# Patient Record
Sex: Male | Born: 1975 | Race: White | Hispanic: No | Marital: Married | Smoking: Former smoker
Health system: Southern US, Community
[De-identification: ages and names within clinical notes are randomized; demographics above are authoritative.]

## PROBLEM LIST (undated history)

## (undated) DIAGNOSIS — F32A Depression, unspecified: Secondary | ICD-10-CM

## (undated) DIAGNOSIS — F1020 Alcohol dependence, uncomplicated: Secondary | ICD-10-CM

## (undated) DIAGNOSIS — G4730 Sleep apnea, unspecified: Secondary | ICD-10-CM

## (undated) DIAGNOSIS — F1910 Other psychoactive substance abuse, uncomplicated: Secondary | ICD-10-CM

## (undated) HISTORY — DX: Other psychoactive substance abuse, uncomplicated: F19.10

## (undated) HISTORY — PX: ORIF ULNAR / RADIAL SHAFT FRACTURE: SUR966

## (undated) HISTORY — DX: Alcohol dependence, uncomplicated: F10.20

## (undated) HISTORY — PX: NASAL SEPTUM SURGERY: SHX37

## (undated) HISTORY — PX: OTHER SURGICAL HISTORY: SHX169

## (undated) HISTORY — PX: HEEL SPUR SURGERY: SHX665

---

## 2002-03-05 ENCOUNTER — Ambulatory Visit (HOSPITAL_BASED_OUTPATIENT_CLINIC_OR_DEPARTMENT_OTHER): Admission: RE | Admit: 2002-03-05 | Discharge: 2002-03-05 | Payer: Self-pay

## 2002-04-26 ENCOUNTER — Ambulatory Visit (HOSPITAL_BASED_OUTPATIENT_CLINIC_OR_DEPARTMENT_OTHER): Admission: RE | Admit: 2002-04-26 | Discharge: 2002-04-26 | Payer: Self-pay | Admitting: Family Medicine

## 2004-01-29 ENCOUNTER — Ambulatory Visit (HOSPITAL_COMMUNITY): Admission: RE | Admit: 2004-01-29 | Discharge: 2004-01-30 | Payer: Self-pay | Admitting: Otolaryngology

## 2010-06-09 ENCOUNTER — Emergency Department (HOSPITAL_COMMUNITY)
Admission: EM | Admit: 2010-06-09 | Discharge: 2010-06-09 | Payer: Self-pay | Source: Home / Self Care | Admitting: Emergency Medicine

## 2010-10-15 NOTE — Op Note (Signed)
NAME:  Richard Osborn                         ACCOUNT NO.:  [id number]   MEDICAL RECORD NO.:  [id number]                   PATIENT TYPE:  OIB   LOCATION:  3302                                 FACILITY:  [facility]   PHYSICIAN:  Kristine Garbe. Ezzard Standing, M.D.         DATE OF BIRTH:  14-Jan-1976   DATE OF PROCEDURE:  01/29/2004  DATE OF DISCHARGE:                                 OPERATIVE REPORT   PREOPERATIVE DIAGNOSES:  1.  Septal deviation with nasal obstruction.  2.  Turbinate hypertrophy with nasal obstruction.  3.  Obstructive sleep apnea.   POSTOPERATIVE DIAGNOSIS:  1.  Septal deviation with nasal obstruction.  2.  Turbinate hypertrophy with nasal obstruction.  3.  Obstructive sleep apnea.   OPERATION PERFORMED:  Septoplasty with bilateral inferior turbinate  reductions.   SURGEON:  Kristine Garbe. Ezzard Standing, M.D.   ANESTHESIA:  General endotracheal.   COMPLICATIONS:  None.   INDICATIONS FOR PROCEDURE:  Richard Osborn is a 35 year old gentleman with  obstructive sleep apnea.  He has nasal obstruction where he had difficulty  breathing and has difficulty using the nasal CPAP because of his difficulty  breathing through his nose.  He is significantly overweight.  Sleep study  demonstrated a moderate obstructive sleep apnea with RDI of approximately  50.  He is taken to the operating room at this time for septoplasty and  turbinate reductions to help improve the nasal airway.   DESCRIPTION OF PROCEDURE:  After adequate endotracheal anesthesia, the  patient received 8 mg of Decadron IV preoperatively as well as 1 g Ancef IV  preoperatively.  Nose was prepped and draped.  Septum and turbinates were  injected with Xylocaine with epinephrine.  The patient had bowing of the  cartilaginous septum to the right airway and then into the left airway, the  cartilaginous septum bowed off at the crest and protruded into the left  inferior turbinate.  A hemitransfixion incision was made along the  caudal  edge of the septum on the right side.  Mucoperichondrial and mucoperiosteal  flaps were elevated posteriorly.  At the junction of the bony and  cartilaginous septum, vertical incision was made.  The bony septum actually  protruded to the right side obstructing the upper portion of the right nasal  airway.  The bony portion of the septum was then removed in addition to a  small portion of the cartilaginous septum.  The anterior 2 cm of  cartilaginous septum was preserved.  This allowed the septum to return more  to midline.  On the left side the cartilaginous septum protruded off the  maxillary crest into the left airway and after elevating the  mucoperichondrium off of the portion of cartilaginous septum protruded, this  was removed.  This improved the nasal airways.  Following this, turbinate  reductions were performed.  The lower one half of the turbinates were  amputated bilaterally and the remaining turbinate bone was outfractured and  hemostasis was obtained with suction cautery.  The hemitransfixion incision  was closed with 4-0 chromic suture.  The anterior portion of the  cartilaginous septum was basted with a 3-0 chromic suture.  Doyle splints  were secured to either side of the septum with a 3-0 nylon suture.  The nose  was then packed with Telfa soaked in bacitracin ointment.  This completed  the procedure.  Richard Osborn was awakened from anesthesia and transferred to  recovery room postoperatively doing well.   DISPOSITION:  Richard Osborn will be observed overnight in a monitored bed because  of his obstructive sleep apnea and unable to use nasal CPAP.  We will plan  on removing his packing in the morning and discharge home on Tylenol and  Vicodin as needed for pain.  Keflex 500 twice daily for one week.  Will have  him follow up in my office in one week for recheck to remove the [facility]  splints.                                               Kristine Garbe. Ezzard Standing, M.D.     CEN/MEDQ  D:  01/29/2004  T:  01/29/2004  Job:  [id number]

## 2011-07-02 ENCOUNTER — Emergency Department (INDEPENDENT_AMBULATORY_CARE_PROVIDER_SITE_OTHER): Payer: Self-pay

## 2011-07-02 ENCOUNTER — Encounter (HOSPITAL_BASED_OUTPATIENT_CLINIC_OR_DEPARTMENT_OTHER): Payer: Self-pay | Admitting: Emergency Medicine

## 2011-07-02 ENCOUNTER — Emergency Department (HOSPITAL_BASED_OUTPATIENT_CLINIC_OR_DEPARTMENT_OTHER)
Admission: EM | Admit: 2011-07-02 | Discharge: 2011-07-02 | Disposition: A | Discharge status: Home / Self Care | Payer: Self-pay | Attending: Emergency Medicine | Admitting: Emergency Medicine

## 2011-07-02 DIAGNOSIS — T148XXA Other injury of unspecified body region, initial encounter: Secondary | ICD-10-CM

## 2011-07-02 DIAGNOSIS — S0181XA Laceration without foreign body of other part of head, initial encounter: Secondary | ICD-10-CM

## 2011-07-02 DIAGNOSIS — S0100XA Unspecified open wound of scalp, initial encounter: Secondary | ICD-10-CM

## 2011-07-02 DIAGNOSIS — M5380 Other specified dorsopathies, site unspecified: Secondary | ICD-10-CM

## 2011-07-02 DIAGNOSIS — IMO0002 Reserved for concepts with insufficient information to code with codable children: Secondary | ICD-10-CM | POA: Insufficient documentation

## 2011-07-02 DIAGNOSIS — F17200 Nicotine dependence, unspecified, uncomplicated: Secondary | ICD-10-CM | POA: Insufficient documentation

## 2011-07-02 DIAGNOSIS — W19XXXA Unspecified fall, initial encounter: Secondary | ICD-10-CM | POA: Insufficient documentation

## 2011-07-02 DIAGNOSIS — F1010 Alcohol abuse, uncomplicated: Secondary | ICD-10-CM

## 2011-07-02 DIAGNOSIS — S0180XA Unspecified open wound of other part of head, initial encounter: Secondary | ICD-10-CM | POA: Insufficient documentation

## 2011-07-02 MED ORDER — TETANUS-DIPHTH-ACELL PERTUSSIS 5-2.5-18.5 LF-MCG/0.5 IM SUSP
0.5000 mL | Freq: Once | INTRAMUSCULAR | Status: AC
  Administered 2011-07-02: 0.5 mL via INTRAMUSCULAR
  Filled 2011-07-02: qty 0.5

## 2011-07-02 MED ORDER — LIDOCAINE-EPINEPHRINE 2 %-1:100000 IJ SOLN
INTRAMUSCULAR | Status: AC
  Filled 2011-07-02: qty 1

## 2011-07-02 MED ORDER — TRAMADOL HCL 50 MG PO TABS: 50.0000 mg | ORAL_TABLET | Freq: Four times a day (QID) | ORAL | Status: AC | PRN

## 2011-07-02 MED ORDER — LIDOCAINE-EPINEPHRINE 2 %-1:100000 IJ SOLN
20.0000 mL | Freq: Once | INTRAMUSCULAR | Status: AC
  Administered 2011-07-02: 20 mL via INTRADERMAL

## 2011-07-02 MED ORDER — LIDOCAINE IN D5W 4-5 MG/ML-% IV SOLN: 1.0000 mg/min | Freq: Once | INTRAVENOUS | Status: DC

## 2011-07-02 NOTE — ED Provider Notes (Signed)
History     CSN: [id number]  Arrival date & time 07/02/11  0119   First MD Initiated Contact with Patient 07/02/11 0155      Chief Complaint  Patient presents with  . Fall  . Facial Laceration  . Alcohol Intoxication    (Consider location/radiation/quality/duration/timing/severity/associated sxs/prior treatment) Patient is a 36 y.o. male presenting with fall, intoxication, and scalp laceration. The history is provided by the patient and the spouse. No language interpreter was used.  Fall The accident occurred 1 to 2 hours ago. The fall occurred while walking. He fell from a height of 1 to 2 ft (5 steps face forawrd). Impact surface: stone steps. The volume of blood lost was minimal. The point of impact was the head. The pain is at a severity of 8/10. The pain is severe. He was ambulatory at the scene. There was no entrapment after the fall. There was no drug use involved in the accident. There was alcohol use involved in the accident. Pertinent negatives include no visual change, no numbness, no abdominal pain, no bowel incontinence, no nausea, no vomiting, no headaches, no hearing loss, no loss of consciousness and no tingling. Exacerbated by: nothing. Prehospitalization: none. He has tried nothing for the symptoms. The treatment provided no relief.  Alcohol Intoxication This is a new problem. The current episode started 3 to 5 hours ago. The problem occurs constantly. The problem has not changed since onset.Pertinent negatives include no abdominal pain and no headaches. The symptoms are aggravated by nothing. The symptoms are relieved by nothing. He has tried nothing for the symptoms. The treatment provided no relief.  Head Laceration This is a new problem. The current episode started 1 to 2 hours ago. The problem occurs constantly. The problem has not changed since onset.Pertinent negatives include no abdominal pain and no headaches. The symptoms are aggravated by nothing. The symptoms are  relieved by nothing. He has tried nothing for the symptoms. The treatment provided no relief.    History reviewed. No pertinent past medical history.  Past Surgical History  Procedure Date  . Nasal septum surgery     No family history on file.  History  Substance Use Topics  . Smoking status: Current Everyday Smoker  . Smokeless tobacco: Not on file  . Alcohol Use: Yes      Review of Systems  Constitutional: Negative.   HENT: Negative for rhinorrhea and neck pain.   Eyes: Negative for visual disturbance.  Respiratory: Negative.   Cardiovascular: Negative.   Gastrointestinal: Negative.  Negative for nausea, vomiting, abdominal pain and bowel incontinence.  Genitourinary: Negative.   Musculoskeletal: Negative.   Skin: Positive for wound.  Neurological: Negative for tingling, loss of consciousness, numbness and headaches.  Hematological: Negative.   Psychiatric/Behavioral: Negative.     Allergies  Review of patient's allergies indicates no known allergies.  Home Medications   Current Outpatient Rx  Name Route Sig Dispense Refill  . TRAMADOL HCL 50 MG PO TABS Oral Take 1 tablet (50 mg total) by mouth every 6 (six) hours as needed for pain. 15 tablet 0    BP 133/49  Pulse 92  Temp(Src) 97.8 F (36.6 C) (Oral)  Resp 18  SpO2 99%  Physical Exam  Constitutional: He is oriented to person, place, and time. He appears well-developed and well-nourished. No distress.  HENT:  Head:    Right Ear: No hemotympanum.  Left Ear: No hemotympanum.  Mouth/Throat: Oropharynx is clear and moist.  Eyes: Conjunctivae and EOM are  normal. Pupils are equal, round, and reactive to light.  Neck: Neck supple. No tracheal deviation present.  Cardiovascular: Normal rate and regular rhythm.   Pulmonary/Chest: Effort normal and breath sounds normal. No respiratory distress.  Abdominal: Soft. Bowel sounds are normal. There is no tenderness. There is no guarding.  Musculoskeletal: Normal  range of motion.       No snuff box tenderness of either wrist, Negative anterior and posterior drawer test of B knees no laxity of the knees to varus or valgus stress  Neurological: He is alert and oriented to person, place, and time. He has normal reflexes.  Skin: Skin is warm and dry.  Psychiatric: He has a normal mood and affect.    ED Course  LACERATION REPAIR Date/Time: 07/02/2011 2:45 AM Performed by: Jasmine Awe Authorized by: Jasmine Awe Consent: Verbal consent obtained. Body area: head/neck Location details: forehead Laceration length: 3 cm Foreign bodies: no foreign bodies Tendon involvement: none Nerve involvement: none Vascular damage: no Anesthesia: local infiltration Local anesthetic: lidocaine 1% with epinephrine Anesthetic total: 3 ml Patient sedated: no Preparation: Patient was prepped and draped in the usual sterile fashion. Debridement: none Skin closure: 4-0 nylon Number of sutures: 6 Technique: simple Approximation: close Approximation difficulty: simple Dressing: 4x4 sterile gauze Patient tolerance: Patient tolerated the procedure well with no immediate complications.   (including critical care time)  Labs Reviewed - No data to display Ct Head Wo Contrast  07/02/2011  *RADIOLOGY REPORT*  Clinical Data: Fall, laceration, EtOH.  CT HEAD WITHOUT CONTRAST,CT CERVICAL SPINE WITHOUT CONTRAST  Technique:  Contiguous axial images were obtained from the base of the skull through the vertex without contrast.,Technique: Multidetector CT imaging of the cervical spine was performed. Multiplanar CT image reconstructions were also generated.  Comparison: None.  Findings: Head:  There is no evidence for acute hemorrhage, hydrocephalus, mass lesion, or abnormal extra-axial fluid collection.  No definite CT evidence for acute infarction.  The visualized paranasal sinuses and mastoid air cells are predominately clear.  No displaced calvarial fracture.  Small frontal scalp laceration.  Cervical spine:  Loss of normal cervical lordosis, with disc osteophyte complex at C5-6 that results in mild to moderate central canal narrowing.  No acute fracture or dislocation identified. Maintained craniocervical relationship.  Vertebral body heights are maintained. No prevertebral soft tissue swelling.  IMPRESSION: Frontal scalp laceration.  No acute intracranial abnormality.  Loss of normal cervical lordosis is likely positional, degenerative, or secondary to muscle spasm.  Disc osteophyte complex at C5-6 results in mild to moderate central canal narrowing.  Original Report Authenticated By: Waneta Martins, M.D.   Ct Cervical Spine Wo Contrast  07/02/2011  *RADIOLOGY REPORT*  Clinical Data: Fall, laceration, EtOH.  CT HEAD WITHOUT CONTRAST,CT CERVICAL SPINE WITHOUT CONTRAST  Technique:  Contiguous axial images were obtained from the base of the skull through the vertex without contrast.,Technique: Multidetector CT imaging of the cervical spine was performed. Multiplanar CT image reconstructions were also generated.  Comparison: None.  Findings: Head:  There is no evidence for acute hemorrhage, hydrocephalus, mass lesion, or abnormal extra-axial fluid collection.  No definite CT evidence for acute infarction.  The visualized paranasal sinuses and mastoid air cells are predominately clear.  No displaced calvarial fracture. Small frontal scalp laceration.  Cervical spine:  Loss of normal cervical lordosis, with disc osteophyte complex at C5-6 that results in mild to moderate central canal narrowing.  No acute fracture or dislocation identified. Maintained craniocervical relationship.  Vertebral body heights  are maintained. No prevertebral soft tissue swelling.  IMPRESSION: Frontal scalp laceration.  No acute intracranial abnormality.  Loss of normal cervical lordosis is likely positional, degenerative, or secondary to muscle spasm.  Disc osteophyte complex at C5-6 results  in mild to moderate central canal narrowing.  Original Report Authenticated By: Waneta Martins, M.D.     1. Fall   2. Facial laceration   3. Abrasion   4. Degenerative disk disease       MDM  Patient informed of DDD and CT findings.  Bland diet x 48 hours hydrate aggressivel.  Suture removal in 7 days.  Return for redness streaking swelling bleeding drainage sooner than 7 days. Patient and wife verbalize understanding and agree to follow up        Joeziah Voit Smitty Cords, MD 07/02/11 1610

## 2011-07-02 NOTE — ED Notes (Signed)
Pt fell down approximately 5 steps, sustained laceration to forehead. Pt denies any other pain. Pt admits to drinking about 10 beers and 2 shots of liquor.

## 2011-10-29 ENCOUNTER — Emergency Department (HOSPITAL_COMMUNITY)
Admission: EM | Admit: 2011-10-29 | Discharge: 2011-10-29 | Disposition: A | Discharge status: Home / Self Care | Payer: Self-pay | Attending: Emergency Medicine | Admitting: Emergency Medicine

## 2011-10-29 ENCOUNTER — Encounter (HOSPITAL_COMMUNITY): Payer: Self-pay | Admitting: Emergency Medicine

## 2011-10-29 DIAGNOSIS — S0100XA Unspecified open wound of scalp, initial encounter: Secondary | ICD-10-CM | POA: Insufficient documentation

## 2011-10-29 DIAGNOSIS — F17200 Nicotine dependence, unspecified, uncomplicated: Secondary | ICD-10-CM | POA: Insufficient documentation

## 2011-10-29 DIAGNOSIS — S0101XA Laceration without foreign body of scalp, initial encounter: Secondary | ICD-10-CM

## 2011-10-29 DIAGNOSIS — W19XXXA Unspecified fall, initial encounter: Secondary | ICD-10-CM | POA: Insufficient documentation

## 2011-10-29 MED ORDER — ACETAMINOPHEN 325 MG PO TABS
650.0000 mg | ORAL_TABLET | Freq: Once | ORAL | Status: AC
  Administered 2011-10-29: 650 mg via ORAL
  Filled 2011-10-29: qty 2

## 2011-10-29 MED ORDER — LIDOCAINE-EPINEPHRINE-TETRACAINE (LET) SOLUTION
3.0000 mL | Freq: Once | NASAL | Status: AC
  Administered 2011-10-29: 3 mL via TOPICAL
  Filled 2011-10-29: qty 3

## 2011-10-29 NOTE — ED Notes (Signed)
Patient bent down to pick something up, lost balance and hit forehead on end of fireplace.  Patient does have ETOH on board.  No blood thinners, full recall of incident, no LOC.

## 2011-10-29 NOTE — Discharge Instructions (Signed)
Laceration Care, Adult A laceration is a cut that goes through all layers of the skin. The cut goes into the tissue beneath the skin. HOME CARE For stitches (sutures) or staples:  Keep the cut clean and dry.   If you have a bandage (dressing), change it at least once a day. Change the bandage if it gets wet or dirty, or as told by your doctor.   Wash the cut with soap and water 2 times a day. Rinse the cut with water. Pat it dry with a clean towel.   Put a thin layer of medicated cream on the cut as told by your doctor.   You may shower after the first 24 hours. Do not soak the cut in water until the stitches are removed.   Only take medicines as told by your doctor.   Have your stitches or staples removed as told by your doctor.  For skin adhesive strips:  Keep the cut clean and dry.   Do not get the strips wet. You may take a bath, but be careful to keep the cut dry.   If the cut gets wet, pat it dry with a clean towel.   The strips will fall off on their own. Do not remove the strips that are still stuck to the cut.  For wound glue:  You may shower or take baths. Do not soak or scrub the cut. Do not swim. Avoid heavy sweating until the glue falls off on its own. After a shower or bath, pat the cut dry with a clean towel.   Do not put medicine on your cut until the glue falls off.   If you have a bandage, do not put tape over the glue.   Avoid lots of sunlight or tanning lamps until the glue falls off. Put sunscreen on the cut for the first year to reduce your scar.   The glue will fall off on its own. Do not pick at the glue.  You may need a tetanus shot if:  You cannot remember when you had your last tetanus shot.   You have never had a tetanus shot.  If you need a tetanus shot and you choose not to have one, you may get tetanus. Sickness from tetanus can be serious. GET HELP RIGHT AWAY IF:   Your pain does not get better with medicine.   Your arm, hand, leg, or  foot loses feeling (numbness) or changes color.   Your cut is bleeding.   Your joint feels weak, or you cannot use your joint.   You have painful lumps on your body.   Your cut is red, puffy (swollen), or painful.   You have a red line on the skin near the cut.   You have yellowish-white fluid (pus) coming from the cut.   You have a fever.   You have a bad smell coming from the cut or bandage.   Your cut breaks open before or after stitches are removed.   You notice something coming out of the cut, such as wood or glass.   You cannot move a finger or toe.  MAKE SURE YOU:   Understand these instructions.   Will watch your condition.   Will get help right away if you are not doing well or get worse.  Document Released: 11/02/2007 Document Revised: 05/05/2011 Document Reviewed: 11/09/2010 [facility] Patient Information 2012 [place], [place].  You have had a head injury which does not appear to require admission  at this time. A concussion is a state of changed mental ability from trauma.  SEEK IMMEDIATE MEDICAL ATTENTION IF: There is confusion or drowsiness (although children frequently become drowsy after injury).  You cannot awaken the injured person.  There is nausea (feeling sick to your stomach) or continued, forceful vomiting.  You notice dizziness or unsteadiness which is getting worse, or inability to walk.  You have convulsions or unconsciousness.  You experience severe, persistent headaches not relieved by Tylenol. (Do not take aspirin as this impairs clotting abilities). Take other pain medications only as directed.  You cannot use arms or legs normally.  There are changes in pupil sizes. (This is the black center in the colored part of the eye)  There is clear or bloody discharge from the nose or ears.  Change in speech, vision, swallowing, or understanding.  Localized weakness, numbness, tingling, or change in bowel or bladder control.

## 2011-10-29 NOTE — ED Provider Notes (Addendum)
History     CSN: [id number]  Arrival date & time 10/29/11  0135   First MD Initiated Contact with Patient 10/29/11 0157      Chief Complaint  Patient presents with  . Fall     Patient is a 36 y.o. male presenting with fall. The history is provided by the patient.  Fall Incident onset: several hours ago. Incident: while standing. Distance fallen: standing. The volume of blood lost was moderate. The point of impact was the head. The pain is present in the head. The pain is moderate. He was ambulatory at the scene. There was alcohol use involved in the accident. Associated symptoms include headaches. Pertinent negatives include no visual change, no fever and no loss of consciousness. The symptoms are aggravated by activity. He has tried rest for the symptoms. The treatment provided mild relief.  Pt reports he was bending over, lost his balance and hit his head on fireplace No LOC Has HA but no vomiting He admits to ETOH but does not feel intoxicated No focal weakness No visual changes  PMH - none  Past Surgical History  Procedure Date  . Nasal septum surgery     No family history on file.  History  Substance Use Topics  . Smoking status: Current Everyday Smoker  . Smokeless tobacco: Not on file  . Alcohol Use: Yes      Review of Systems  Constitutional: Negative for fever.  Neurological: Positive for headaches. Negative for loss of consciousness.    Allergies  Review of patient's allergies indicates no known allergies.  Home Medications   Current Outpatient Rx  Name Route Sig Dispense Refill  . ADULT MULTIVITAMIN W/MINERALS CH Oral Take 1 tablet by mouth daily.      BP 123/56  Pulse 91  Temp(Src) 97.9 F (36.6 C) (Oral)  Resp 20  SpO2 96%  Physical Exam CONSTITUTIONAL: Well developed/well nourished HEAD AND FACE: scalp laceration noted, no active bleeding, no crepitance/stepoffs EYES: EOMI/PERRL ENMT: Mucous membranes moist NECK: supple no meningeal  signs SPINE:entire spine nontender CV: S1/S2 noted, no murmurs/rubs/gallops noted LUNGS: Lungs are clear to auscultation bilaterally, no apparent distress ABDOMEN: soft, nontender, no rebound or guarding GU:no cva tenderness NEURO: Pt is awake/alert, moves all extremitiesx4, GCS 15 EXTREMITIES: pulses normal, full ROM SKIN: warm, color normal PSYCH: no abnormalities of mood noted  ED Course  Procedures   LACERATION REPAIR Performed by: Joya Gaskins Consent: Verbal consent obtained. Risks and benefits: risks, benefits and alternatives were discussed Patient identity confirmed: provided demographic data Time out performed prior to procedure Prepped and Draped in normal sterile fashion Wound explored  Laceration Location: scalp  Laceration Length: 3cm  No Foreign Bodies seen or palpated  Anesthesia: local infiltration  Local anesthetic: lidocaine 1% with epinephrine  Anesthetic total: 5 ml  Amount of cleaning: standard  Skin closure: simple  Number of sutures or staples: 3 staples  Technique: simple  Patient tolerance: Patient tolerated the procedure well with no immediate complications.   2:21 AM Pt well appearing, no distress, not intoxicated clinically, defer CT imaging 4:21 AM Pt resting comfortably, HA improved no vomiting, GCS 15 Defer imaging but we discussed strict return precautions Pt has ride home  MDM  Nursing notes reviewed and considered in documentation         Joya Gaskins, MD 10/29/11 0221  Joya Gaskins, MD 10/29/11 [phone]

## 2011-11-17 ENCOUNTER — Emergency Department (INDEPENDENT_AMBULATORY_CARE_PROVIDER_SITE_OTHER)
Admission: EM | Admit: 2011-11-17 | Discharge: 2011-11-17 | Disposition: A | Discharge status: Home / Self Care | Payer: Self-pay | Source: Home / Self Care | Attending: Emergency Medicine | Admitting: Emergency Medicine

## 2011-11-17 ENCOUNTER — Encounter (HOSPITAL_COMMUNITY): Payer: Self-pay

## 2011-11-17 DIAGNOSIS — Z4802 Encounter for removal of sutures: Secondary | ICD-10-CM

## 2011-11-17 DIAGNOSIS — S139XXA Sprain of joints and ligaments of unspecified parts of neck, initial encounter: Secondary | ICD-10-CM

## 2011-11-17 DIAGNOSIS — S161XXA Strain of muscle, fascia and tendon at neck level, initial encounter: Secondary | ICD-10-CM

## 2011-11-17 HISTORY — DX: Sleep apnea, unspecified: G47.30

## 2011-11-17 MED ORDER — MELOXICAM 15 MG PO TABS: 15.0000 mg | ORAL_TABLET | Freq: Every day | ORAL | Status: DC

## 2011-11-17 MED ORDER — CYCLOBENZAPRINE HCL 10 MG PO TABS: 10.0000 mg | ORAL_TABLET | Freq: Three times a day (TID) | ORAL | Status: AC | PRN

## 2011-11-17 NOTE — Discharge Instructions (Signed)
Take the medication as written. Take 1 gram of tylenol up to 4 times a day as needed for pain and fever. This with the meloxicam is an effective combination for pain. . Do not exceed 4 grams of tylenol a day from all sources. Return if you get worse, have a  fever >100.4, or for any concerns.   Go to www.goodrx.com to look up your medications. This will give you a list of where you can find your prescriptions at the most affordable prices.

## 2011-11-17 NOTE — ED Provider Notes (Signed)
History     CSN: [id number]  Arrival date & time 11/17/11  1111   First MD Initiated Contact with Patient 11/17/11 1117      Chief Complaint  Patient presents with  . Suture / Staple Removal  . Neck Pain    (Consider location/radiation/quality/duration/timing/severity/associated sxs/prior treatment) HPI Comments: The patient's status post fall 19 days ago, sustained 3 cm scalp laceration with 3 staples. Patient is also complaining of left-sided neck "soreness", some "popping" when he flexes/extends neck, rotates head. System present since his fall. No weakness, numbness, midline neck pain. States that the laceration the scalp is healing well, no complaints  Patient is a 36 y.o. male presenting with suture removal and neck injury. The history is provided by the patient. No language interpreter was used.  Suture / Staple Removal  The sutures were placed more than 14 days ago. There has been no treatment since the wound repair. There has been no drainage from the wound. There is no redness present. There is no swelling present. The pain has no pain.  Neck Injury This is a new problem. The current episode started more than 1 week ago. The problem occurs constantly. The problem has not changed since onset.Pertinent negatives include no headaches. Exacerbated by: turning head to left. Nothing relieves the symptoms. He has tried nothing for the symptoms. The treatment provided no relief.    Past Medical History  Diagnosis Date  . Sleep apnea     Past Surgical History  Procedure Date  . Nasal septum surgery     History reviewed. No pertinent family history.  History  Substance Use Topics  . Smoking status: Current Everyday Smoker  . Smokeless tobacco: Not on file  . Alcohol Use: Yes      Review of Systems  Neurological: Negative for headaches.    Allergies  Review of patient's allergies indicates no known allergies.  Home Medications   Current Outpatient Rx  Name Route  Sig Dispense Refill  . CYCLOBENZAPRINE HCL 10 MG PO TABS Oral Take 1 tablet (10 mg total) by mouth 3 (three) times daily as needed for muscle spasms. 20 tablet 0  . MELOXICAM 15 MG PO TABS Oral Take 1 tablet (15 mg total) by mouth daily. 14 tablet 0  . ADULT MULTIVITAMIN W/MINERALS CH Oral Take 1 tablet by mouth daily.      BP 121/59  Pulse 66  Temp 98.4 F (36.9 C) (Oral)  Resp 16  SpO2 100%  Physical Exam  Nursing note and vitals reviewed. Constitutional: He is oriented to person, place, and time. He appears well-developed and well-nourished.  HENT:  Head: Normocephalic and atraumatic.       Healed 3 cm laceration with 3 staples intact anterior scalp no signs of infection  Eyes: Conjunctivae and EOM are normal.  Neck: Normal range of motion. Neck supple. Muscular tenderness present. No spinous process tenderness present.         Mild spasm see drawing  Cardiovascular: Normal rate.   Pulmonary/Chest: Effort normal. No respiratory distress.  Abdominal: He exhibits no distension.  Musculoskeletal: Normal range of motion.       Left shoulder: Normal. He exhibits normal pulse and normal strength.       Left upper arm: Normal.  Neurological: He is alert and oriented to person, place, and time.  Skin: Skin is warm and dry.  Psychiatric: He has a normal mood and affect. His behavior is normal.    ED Course  SUTURE REMOVAL  Date/Time: 11/17/2011 12:41 PM Performed by: Luiz Blare Authorized by: Luiz Blare Consent: Verbal consent obtained. Risks and benefits: risks, benefits and alternatives were discussed Consent given by: patient Patient understanding: patient states understanding of the procedure being performed Patient consent: the patient's understanding of the procedure matches consent given Required items: required blood products, implants, devices, and special equipment available Patient identity confirmed: verbally with patient Time out: Immediately  prior to procedure a "time out" was called to verify the correct patient, procedure, equipment, support staff and site/side marked as required. Body area: head/neck Location details: scalp Wound Appearance: clean Staples Removed: 3 Patient tolerance: Patient tolerated the procedure well with no immediate complications.   (including critical care time)  Labs Reviewed - No data to display No results found.   1. Encounter for staple removal   2. Cervical strain       MDM  Previous notes reviewed. As noted in history of present illness. No evidence of bony injury to the neck. H&P most consistent with left-sided cervical strain. Home with meloxicam and muscle relaxants.  Luiz Blare, MD 11/17/11 1245

## 2011-11-17 NOTE — ED Notes (Signed)
Pt seen in ED almost 3 weeks ago for repair of scalp laceration with staples.  Here today for staple removal.  Denies concerns with staples.  3 staples intact to top of scalp.  Pt c/o lt side neck pain for 2 weeks and would like this checked out.  States it hurts worse when he turns his head to the left.

## 2012-03-22 ENCOUNTER — Emergency Department (HOSPITAL_COMMUNITY)
Admission: EM | Admit: 2012-03-22 | Discharge: 2012-03-22 | Disposition: A | Discharge status: Home / Self Care | Payer: No Typology Code available for payment source | Attending: Emergency Medicine | Admitting: Emergency Medicine

## 2012-03-22 ENCOUNTER — Encounter (HOSPITAL_COMMUNITY): Payer: Self-pay | Admitting: *Deleted

## 2012-03-22 DIAGNOSIS — IMO0002 Reserved for concepts with insufficient information to code with codable children: Secondary | ICD-10-CM

## 2012-03-22 DIAGNOSIS — F17200 Nicotine dependence, unspecified, uncomplicated: Secondary | ICD-10-CM | POA: Insufficient documentation

## 2012-03-22 DIAGNOSIS — G4739 Other sleep apnea: Secondary | ICD-10-CM | POA: Insufficient documentation

## 2012-03-22 DIAGNOSIS — S139XXA Sprain of joints and ligaments of unspecified parts of neck, initial encounter: Secondary | ICD-10-CM | POA: Insufficient documentation

## 2012-03-22 DIAGNOSIS — Y9389 Activity, other specified: Secondary | ICD-10-CM | POA: Insufficient documentation

## 2012-03-22 DIAGNOSIS — M62830 Muscle spasm of back: Secondary | ICD-10-CM

## 2012-03-22 DIAGNOSIS — M62838 Other muscle spasm: Secondary | ICD-10-CM | POA: Insufficient documentation

## 2012-03-22 MED ORDER — METHOCARBAMOL 500 MG PO TABS: 500.0000 mg | ORAL_TABLET | Freq: Two times a day (BID) | ORAL | Status: DC

## 2012-03-22 MED ORDER — HYDROCODONE-ACETAMINOPHEN 5-325 MG PO TABS: 2.0000 | ORAL_TABLET | ORAL | Status: DC | PRN

## 2012-03-22 NOTE — ED Notes (Signed)
C/O soreness across lower back and neck. MOE x4 without deficit.
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
## 2013-01-01 DIAGNOSIS — E291 Testicular hypofunction: Secondary | ICD-10-CM | POA: Insufficient documentation

## 2014-04-12 ENCOUNTER — Encounter (HOSPITAL_COMMUNITY): Payer: Self-pay | Admitting: Emergency Medicine

## 2014-04-12 ENCOUNTER — Emergency Department (HOSPITAL_COMMUNITY)
Admission: EM | Admit: 2014-04-12 | Discharge: 2014-04-12 | Disposition: A | Discharge status: Home / Self Care | Payer: BC Managed Care – PPO | Attending: Emergency Medicine | Admitting: Emergency Medicine

## 2014-04-12 DIAGNOSIS — R000 Tachycardia, unspecified: Secondary | ICD-10-CM | POA: Insufficient documentation

## 2014-04-12 DIAGNOSIS — Z720 Tobacco use: Secondary | ICD-10-CM | POA: Insufficient documentation

## 2014-04-12 DIAGNOSIS — G4730 Sleep apnea, unspecified: Secondary | ICD-10-CM | POA: Insufficient documentation

## 2014-04-12 DIAGNOSIS — Z8781 Personal history of (healed) traumatic fracture: Secondary | ICD-10-CM | POA: Diagnosis not present

## 2014-04-12 DIAGNOSIS — R609 Edema, unspecified: Secondary | ICD-10-CM

## 2014-04-12 DIAGNOSIS — L03115 Cellulitis of right lower limb: Secondary | ICD-10-CM | POA: Diagnosis not present

## 2014-04-12 DIAGNOSIS — Z79899 Other long term (current) drug therapy: Secondary | ICD-10-CM | POA: Insufficient documentation

## 2014-04-12 DIAGNOSIS — M79609 Pain in unspecified limb: Secondary | ICD-10-CM

## 2014-04-12 DIAGNOSIS — M7989 Other specified soft tissue disorders: Secondary | ICD-10-CM | POA: Diagnosis present

## 2014-04-12 LAB — CBC WITH DIFFERENTIAL/PLATELET
Basophils Absolute: 0 10*3/uL (ref 0.0–0.1)
Basophils Relative: 0 % (ref 0–1)
Eosinophils Absolute: 0.4 10*3/uL (ref 0.0–0.7)
Eosinophils Relative: 3 % (ref 0–5)
HEMATOCRIT: 53.2 % — AB (ref 39.0–52.0)
HEMOGLOBIN: 18.6 g/dL — AB (ref 13.0–17.0)
Lymphocytes Relative: 16 % (ref 12–46)
Lymphs Abs: 1.9 10*3/uL (ref 0.7–4.0)
MCH: 37 pg — ABNORMAL HIGH (ref 26.0–34.0)
MCHC: 35 g/dL (ref 30.0–36.0)
MCV: 105.8 fL — ABNORMAL HIGH (ref 78.0–100.0)
MONOS PCT: 10 % (ref 3–12)
Monocytes Absolute: 1.2 10*3/uL — ABNORMAL HIGH (ref 0.1–1.0)
NEUTROS ABS: 8.5 10*3/uL — AB (ref 1.7–7.7)
Neutrophils Relative %: 71 % (ref 43–77)
PLATELETS: 162 10*3/uL (ref 150–400)
RBC: 5.03 MIL/uL (ref 4.22–5.81)
RDW: 13.9 % (ref 11.5–15.5)
WBC: 12 10*3/uL — ABNORMAL HIGH (ref 4.0–10.5)

## 2014-04-12 LAB — BASIC METABOLIC PANEL
Anion gap: 14 (ref 5–15)
BUN: 8 mg/dL (ref 6–23)
CHLORIDE: 106 meq/L (ref 96–112)
CO2: 21 meq/L (ref 19–32)
CREATININE: 0.93 mg/dL (ref 0.50–1.35)
Calcium: 8.5 mg/dL (ref 8.4–10.5)
GFR calc Af Amer: >90 mL/min (ref >90)
GLUCOSE: 92 mg/dL (ref 70–99)
Potassium: 4.1 mEq/L (ref 3.7–5.3)
SODIUM: 141 meq/L (ref 137–147)

## 2014-04-12 MED ORDER — DOXYCYCLINE HYCLATE 100 MG PO CAPS: 100.0000 mg | ORAL_CAPSULE | Freq: Two times a day (BID) | ORAL | Status: DC

## 2014-04-12 NOTE — ED Notes (Signed)
Bed: WA22 Expected date:  Expected time:  Means of arrival:  Comments: 

## 2014-04-12 NOTE — ED Provider Notes (Addendum)
CSN: [id number]     Arrival date & time 04/12/14  0810 History   First MD Initiated Contact with Patient 04/12/14 0827     Chief Complaint  Patient presents with  . Leg Swelling     (Consider location/radiation/quality/duration/timing/severity/associated sxs/prior Treatment) HPI Comments: Pt notes 4 month h/o bilateral le edema with now worsening left calf pain and tenderness x4 days--denies chest pain/sob--no syncope or near syncope--sx worse with movement and characterized as sharp--used motrin without relief--no prior h/o dvt--no fever, chills--sx persistent and nothing makes them better  The history is provided by the patient.    Past Medical History  Diagnosis Date  . Sleep apnea    Past Surgical History  Procedure Laterality Date  . Nasal septum surgery    . Orif ulnar / radial shaft fracture     History reviewed. No pertinent family history. History  Substance Use Topics  . Smoking status: Current Every Day Smoker  . Smokeless tobacco: Not on file  . Alcohol Use: Yes    Review of Systems  All other systems reviewed and are negative.     Allergies  Review of patient's allergies indicates no known allergies.  Home Medications   Prior to Admission medications   Medication Sig Start Date End Date Taking? Authorizing Provider  DM-Phenylephrine-Acetaminophen (ALKA-SELTZER PLS SINUS & COUGH PO) Take 1 capsule by mouth every 8 (eight) hours as needed. For cold    Historical Provider, MD  HYDROcodone-acetaminophen (NORCO/VICODIN) 5-325 MG per tablet Take 2 tablets by mouth every 4 (four) hours as needed for pain. 03/22/12   John-Adam Bonk, MD  methocarbamol (ROBAXIN) 500 MG tablet Take 1-2 tablets (500-1,000 mg total) by mouth 2 (two) times daily. 03/22/12   John-Adam Bonk, MD  Multiple Vitamin (MULITIVITAMIN WITH MINERALS) TABS Take 1 tablet by mouth daily.    Historical Provider, MD  testosterone cypionate (DEPOTESTOTERONE CYPIONATE) 200 MG/ML injection  01/21/14    Historical Provider, MD   BP 150/93 mmHg  Pulse 105  Temp(Src) 97.7 F (36.5 C) (Oral)  Resp 16  SpO2 100% Physical Exam  Constitutional: He is oriented to person, place, and time. He appears well-developed and well-nourished.  Non-toxic appearance. No distress.  HENT:  Head: Normocephalic and atraumatic.  Eyes: Conjunctivae, EOM and lids are normal. Pupils are equal, round, and reactive to light.  Neck: Normal range of motion. Neck supple. No tracheal deviation present. No thyroid mass present.  Cardiovascular: Regular rhythm and normal heart sounds.  Tachycardia present.  Exam reveals no gallop.   No murmur heard. Pulmonary/Chest: Effort normal and breath sounds normal. No stridor. No respiratory distress. He has no decreased breath sounds. He has no wheezes. He has no rhonchi. He has no rales.  Abdominal: Soft. Normal appearance and bowel sounds are normal. He exhibits no distension. There is no tenderness. There is no rebound and no CVA tenderness.  Musculoskeletal: Normal range of motion. He exhibits no edema or tenderness.       Legs: Swelling noted and tender at left calf  Neurological: He is alert and oriented to person, place, and time. He has normal strength. No cranial nerve deficit or sensory deficit. GCS eye subscore is 4. GCS verbal subscore is 5. GCS motor subscore is 6.  Skin: Skin is warm and dry. No abrasion and no rash noted.  Psychiatric: He has a normal mood and affect. His speech is normal and behavior is normal.  Nursing note and vitals reviewed.   ED Course  Procedures (including  critical care time) Labs Review Labs Reviewed  CBC WITH DIFFERENTIAL  BASIC METABOLIC PANEL    Imaging Review No results found.   EKG Interpretation None      MDM   Final diagnoses:  None   Doppler le negative, pt has rle erythema, suspect possible early cellulitis, will tx with antibiotics.    Toy BakerAnthony T Jacey Eckerson, MD 04/12/14 1042  Toy BakerAnthony T Taeko Schaffer, MD 04/12/14  [id number]

## 2014-04-12 NOTE — ED Notes (Signed)
US tech completed doppler

## 2014-04-12 NOTE — Discharge Instructions (Signed)

## 2014-04-12 NOTE — ED Notes (Signed)
Pt states he has had bilateral leg swelling x several months but has not been seen for it.  Pt states that 4 days ago, his lt leg started hurting. C/o pain to calf w/ positive homan's sign.  Redness noted to rt lower leg but states this leg is not painful.

## 2014-04-12 NOTE — Progress Notes (Signed)
VASCULAR LAB PRELIMINARY  PRELIMINARY  PRELIMINARY  PRELIMINARY  Left lower extremity venous Doppler completed.    Preliminary report:  There is no DVT or SVT noted in the left lower extremity.   Deldrick Linch, RVT 04/12/2014, 9:37 AM

## 2014-04-12 NOTE — ED Notes (Signed)
Pt ambulatory upon DC. He reports he is leaving with ALL belongings he arrived with.

## 2014-08-29 ENCOUNTER — Institutional Professional Consult (permissible substitution): Payer: Self-pay | Admitting: Pulmonary Disease

## 2014-09-23 ENCOUNTER — Ambulatory Visit (INDEPENDENT_AMBULATORY_CARE_PROVIDER_SITE_OTHER): Payer: BLUE CROSS/BLUE SHIELD | Admitting: Pulmonary Disease

## 2014-09-23 ENCOUNTER — Encounter: Payer: Self-pay | Admitting: Pulmonary Disease

## 2014-09-23 VITALS — BP 124/70 | HR 85 | Temp 98.4°F | Ht 70.0 in | Wt 334.2 lb

## 2014-09-23 DIAGNOSIS — G4733 Obstructive sleep apnea (adult) (pediatric): Secondary | ICD-10-CM

## 2014-09-23 NOTE — Patient Instructions (Signed)
Will change your machine over to the auto setting so that it can self adjust for the severity of your apnea Will send order to [facility] to get you new mask and supplies. Work on weight loss followup again in one year if doing well.

## 2014-09-23 NOTE — Progress Notes (Signed)
Subjective:    Patient ID: Richard Osborn, male    DOB: 04/30/1976, 39 y.o.   MRN: [id number]  HPI The patient is a 39 year old male who I've been asked to see for management of obstructive sleep apnea. Tells me that he was diagnosed with severe OSA in 2003, and has been on C Pap since that time. He has a fairly new device that is working properly, but has not been able to afford any of the supplies or mask because of a lack of insurance. He has been piecing everything together with duct tape and getting by. It has been difficult to wear compliantly because of his aged equipment, and he is recently gotten insurance again and would like to get his device up-to-date. He is currently set on a pressure of 11 cm, but is having breakthrough events on this level. The patient tells me that his weight is up about 30 pounds over the last 2 years. He does well when he wears C Pap with a full mask on a consistent basis. He feels adequately rested with adequate daytime alertness when he wears the device. His Epworth score today is only 5.   Sleep Questionnaire What time do you typically go to bed?( Between what hours) 10p-12a 10p-12a at 1520 on 09/23/14 by Tommie SamsMindy S Silva, CMA How long does it take you to fall asleep? varies varies at 1520 on 09/23/14 by Tommie SamsMindy S Silva, CMA How many times during the night do you wake up? 4 4 at 1520 on 09/23/14 by Tommie SamsMindy S Silva, CMA What time do you get out of bed to start your day? 0700 0700 at 1520 on 09/23/14 by Tommie SamsMindy S Silva, CMA Do you drive or operate heavy machinery in your occupation? Yes Yes at 1520 on 09/23/14 by Tommie SamsMindy S Silva, CMA How much has your weight changed (up or down) over the past two years? (In pounds) 30 lb (13.608 kg) 30 lb (13.608 kg) at 1520 on 09/23/14 by Tommie SamsMindy S Silva, CMA Have you ever had a sleep study before? Yes Yes at 1520 on 09/23/14 by Tommie SamsMindy S Silva, CMA If yes, [facility] at 1520 on 09/23/14 by Tommie SamsMindy S Silva, CMA  If yes, date of study? 2003 2003 at 1520 on 09/23/14 by Tommie SamsMindy S Silva, CMA Do you currently use CPAP? No No at 1520 on 09/23/14 by Tommie SamsMindy S Silva, CMA Do you wear oxygen at any time? No No at 1520 on 09/23/14 by Tommie SamsMindy S Silva, CMA   Review of Systems  Constitutional: Negative for fever and unexpected weight change.  HENT: Positive for congestion. Negative for dental problem, ear pain, nosebleeds, postnasal drip, rhinorrhea, sinus pressure, sneezing, sore throat and trouble swallowing.   Eyes: Negative for redness and itching.  Respiratory: Negative for cough, chest tightness, shortness of breath and wheezing.   Cardiovascular: Negative for palpitations and leg swelling.  Gastrointestinal: Negative for nausea and vomiting.  Genitourinary: Negative for dysuria.  Musculoskeletal: Negative for joint swelling.  Skin: Negative for rash.  Neurological: Negative for headaches.  Hematological: Does not bruise/bleed easily.  Psychiatric/Behavioral: Negative for dysphoric mood. The patient is not nervous/anxious.        Objective:   Physical Exam Constitutional:  Morbidly obese male, no acute distress  HENT:  Nares patent without discharge  Oropharynx without exudate, palate and uvula are thick and elongated  Eyes:  Perrla, eomi, no scleral icterus  Neck:  No JVD, no TMG  Cardiovascular:  Normal rate,  regular rhythm, no rubs or gallops.  No murmurs        Intact distal pulses  Pulmonary :  Normal breath sounds, no stridor or respiratory distress   No rales, rhonchi, or wheezing  Abdominal:  Soft, nondistended, bowel sounds present.  No tenderness noted.   Musculoskeletal:  1+ lower extremity edema noted.  Lymph Nodes:  No cervical lymphadenopathy noted  Skin:  No cyanosis noted  Neurologic:  Alert, appropriate, moves all 4 extremities without obvious deficit.         Assessment & Plan:

## 2014-09-23 NOTE — Assessment & Plan Note (Signed)
The patient has a history of sleep apnea dating back to 2003, and has done very well with C Pap in the past. He lost his insurance, and was not able to afford new supplies, and has had difficulty wearing his machine with worn out parts. He now has insurance again, and we need to get him new supplies so that he can wear his C Pap more consistently and effectively. I did get a download off his device, and his pressure is clearly not high enough. He does have an auto device, and I will change his pressure over to the auto setting with a wide range of pressure.  Finally, I have encouraged him to work aggressively on weight loss.

## 2014-09-30 ENCOUNTER — Telehealth: Payer: Self-pay | Admitting: Pulmonary Disease

## 2014-09-30 DIAGNOSIS — G4733 Obstructive sleep apnea (adult) (pediatric): Secondary | ICD-10-CM

## 2014-09-30 NOTE — Telephone Encounter (Signed)
Spoke with pt. He is needing new CPAP supplies. Order was [facility] on 09/23/14 [facility] who he is already [facility] with. [facility]  Called and spoke with Melissa with [facility]. They do not have this pt in their system.  Spoke with the pt again. Another order has been placed to go the correct DME. Apologized for the inconvenience and asked him to call us back if he continues to have problems.

## 2014-10-07 ENCOUNTER — Telehealth: Payer: Self-pay | Admitting: Pulmonary Disease

## 2014-10-07 NOTE — Telephone Encounter (Signed)
Patient has not heard anything regarding his supplies from DME.Mar[facility] Kitchen. [facility] can [facility] him?

## 2014-10-07 NOTE — Telephone Encounter (Signed)
Spoke to [email or web address]  she has tried to reach pt on several times and it turns out she had the wrong # i gave her this phone# pt has left here i also called pt he is awre of this and will expect a call from [facility] Richard Osborn

## 2014-10-21 ENCOUNTER — Telehealth: Payer: Self-pay | Admitting: Pulmonary Disease

## 2014-10-21 NOTE — Telephone Encounter (Signed)
Spoke with pt, states that he has not yet heard [facility] re: his cpap supplies.  Pt states he has not tried to contact [facility].  Called [facility] and spoke with [place],  After 14 minutes of conversation, the line was hung up on.   Called [facility] again and spoke with Minerva [employer]olaric, states that they have been trying to contact patient to update address but have not been successful.    Spoke with pt to make him aware.  Nothing further needed.

## 2015-04-15 ENCOUNTER — Other Ambulatory Visit: Payer: Self-pay | Admitting: Nurse Practitioner

## 2015-04-15 DIAGNOSIS — M546 Pain in thoracic spine: Secondary | ICD-10-CM

## 2015-04-28 ENCOUNTER — Inpatient Hospital Stay: Admission: RE | Admit: 2015-04-28 | Payer: BLUE CROSS/BLUE SHIELD | Source: Ambulatory Visit

## 2015-06-24 ENCOUNTER — Encounter (HOSPITAL_COMMUNITY): Payer: Self-pay

## 2015-06-24 ENCOUNTER — Inpatient Hospital Stay (HOSPITAL_COMMUNITY)
Admission: EM | Admit: 2015-06-24 | Discharge: 2015-06-29 | DRG: 872 | Disposition: A | Discharge status: Home / Self Care | Payer: BLUE CROSS/BLUE SHIELD | Attending: Internal Medicine | Admitting: Internal Medicine

## 2015-06-24 DIAGNOSIS — A419 Sepsis, unspecified organism: Secondary | ICD-10-CM | POA: Diagnosis not present

## 2015-06-24 DIAGNOSIS — N179 Acute kidney failure, unspecified: Secondary | ICD-10-CM | POA: Diagnosis present

## 2015-06-24 DIAGNOSIS — B353 Tinea pedis: Secondary | ICD-10-CM | POA: Diagnosis present

## 2015-06-24 DIAGNOSIS — L03115 Cellulitis of right lower limb: Secondary | ICD-10-CM | POA: Diagnosis present

## 2015-06-24 DIAGNOSIS — G4733 Obstructive sleep apnea (adult) (pediatric): Secondary | ICD-10-CM | POA: Diagnosis present

## 2015-06-24 DIAGNOSIS — F17210 Nicotine dependence, cigarettes, uncomplicated: Secondary | ICD-10-CM | POA: Diagnosis present

## 2015-06-24 DIAGNOSIS — D72829 Elevated white blood cell count, unspecified: Secondary | ICD-10-CM | POA: Diagnosis not present

## 2015-06-24 DIAGNOSIS — Z6841 Body Mass Index (BMI) 40.0 and over, adult: Secondary | ICD-10-CM

## 2015-06-24 DIAGNOSIS — L0390 Cellulitis, unspecified: Secondary | ICD-10-CM | POA: Diagnosis present

## 2015-06-24 LAB — CBC WITH DIFFERENTIAL/PLATELET
BASOS ABS: 0 10*3/uL (ref 0.0–0.1)
BASOS PCT: 0 %
EOS ABS: 0.1 10*3/uL (ref 0.0–0.7)
Eosinophils Relative: 0 %
HCT: 51.3 % (ref 39.0–52.0)
HEMOGLOBIN: 17.4 g/dL — AB (ref 13.0–17.0)
LYMPHS ABS: 0.6 10*3/uL — AB (ref 0.7–4.0)
Lymphocytes Relative: 2 %
MCH: 34.7 pg — AB (ref 26.0–34.0)
MCHC: 33.9 g/dL (ref 30.0–36.0)
MCV: 102.2 fL — ABNORMAL HIGH (ref 78.0–100.0)
Monocytes Absolute: 1.1 10*3/uL — ABNORMAL HIGH (ref 0.1–1.0)
Monocytes Relative: 4 %
NEUTROS PCT: 93 %
Neutro Abs: 25 10*3/uL — ABNORMAL HIGH (ref 1.7–7.7)
PLATELETS: 153 10*3/uL (ref 150–400)
RBC: 5.02 MIL/uL (ref 4.22–5.81)
RDW: 13.7 % (ref 11.5–15.5)
WBC: 26.8 10*3/uL — AB (ref 4.0–10.5)

## 2015-06-24 LAB — COMPREHENSIVE METABOLIC PANEL
ALBUMIN: 3.1 g/dL — AB (ref 3.5–5.0)
ALK PHOS: 61 U/L (ref 38–126)
ALT: 84 U/L — AB (ref 17–63)
AST: 47 U/L — AB (ref 15–41)
Anion gap: 11 (ref 5–15)
BUN: 19 mg/dL (ref 6–20)
CALCIUM: 8.2 mg/dL — AB (ref 8.9–10.3)
CHLORIDE: 105 mmol/L (ref 101–111)
CO2: 19 mmol/L — AB (ref 22–32)
CREATININE: 1.65 mg/dL — AB (ref 0.61–1.24)
GFR calc Af Amer: 59 mL/min — ABNORMAL LOW (ref >60)
GFR calc non Af Amer: 50 mL/min — ABNORMAL LOW (ref >60)
GLUCOSE: 107 mg/dL — AB (ref 65–99)
Potassium: 4.1 mmol/L (ref 3.5–5.1)
SODIUM: 135 mmol/L (ref 135–145)
Total Bilirubin: 1.8 mg/dL — ABNORMAL HIGH (ref 0.3–1.2)
Total Protein: 7.1 g/dL (ref 6.5–8.1)

## 2015-06-24 LAB — I-STAT CG4 LACTIC ACID, ED: Lactic Acid, Venous: 1.37 mmol/L (ref 0.5–2.0)

## 2015-06-24 MED ORDER — ACETAMINOPHEN 500 MG PO TABS
1000.0000 mg | ORAL_TABLET | Freq: Once | ORAL | Status: AC
  Administered 2015-06-24: 1000 mg via ORAL
  Filled 2015-06-24: qty 2

## 2015-06-24 MED ORDER — VANCOMYCIN HCL IN DEXTROSE 1-5 GM/200ML-% IV SOLN
1000.0000 mg | Freq: Two times a day (BID) | INTRAVENOUS | Status: DC
  Administered 2015-06-25: 1000 mg via INTRAVENOUS
  Filled 2015-06-24: qty 200

## 2015-06-24 MED ORDER — SODIUM CHLORIDE 0.9 % IV SOLN
2500.0000 mg | Freq: Once | INTRAVENOUS | Status: AC
  Administered 2015-06-24: 2500 mg via INTRAVENOUS
  Filled 2015-06-24: qty 2000

## 2015-06-24 MED ORDER — ONDANSETRON HCL 4 MG/2ML IJ SOLN: 4.0000 mg | Freq: Four times a day (QID) | INTRAMUSCULAR | Status: DC | PRN

## 2015-06-24 MED ORDER — INFLUENZA VAC SPLIT QUAD 0.5 ML IM SUSY
0.5000 mL | PREFILLED_SYRINGE | INTRAMUSCULAR | Status: AC
  Administered 2015-06-25: 0.5 mL via INTRAMUSCULAR
  Filled 2015-06-24 (×2): qty 0.5

## 2015-06-24 MED ORDER — HYDROMORPHONE HCL 1 MG/ML IJ SOLN
1.0000 mg | Freq: Once | INTRAMUSCULAR | Status: AC
  Administered 2015-06-24: 1 mg via INTRAVENOUS
  Filled 2015-06-24: qty 1

## 2015-06-24 MED ORDER — ONDANSETRON HCL 4 MG PO TABS: 4.0000 mg | ORAL_TABLET | Freq: Four times a day (QID) | ORAL | Status: DC | PRN

## 2015-06-24 MED ORDER — ACETAMINOPHEN 650 MG RE SUPP: 650.0000 mg | Freq: Four times a day (QID) | RECTAL | Status: DC | PRN

## 2015-06-24 MED ORDER — HEPARIN SODIUM (PORCINE) 5000 UNIT/ML IJ SOLN
5000.0000 [IU] | Freq: Three times a day (TID) | INTRAMUSCULAR | Status: DC
  Administered 2015-06-24 – 2015-06-29 (×14): 5000 [IU] via SUBCUTANEOUS
  Filled 2015-06-24 (×17): qty 1

## 2015-06-24 MED ORDER — SODIUM CHLORIDE 0.9 % IV SOLN
INTRAVENOUS | Status: DC
Start: 2015-06-24 — End: 2015-06-29
  Administered 2015-06-25 – 2015-06-29 (×6): via INTRAVENOUS

## 2015-06-24 MED ORDER — SODIUM CHLORIDE 0.9 % IV BOLUS (SEPSIS)
1000.0000 mL | Freq: Once | INTRAVENOUS | Status: AC
  Administered 2015-06-24: 1000 mL via INTRAVENOUS

## 2015-06-24 MED ORDER — HYDROCODONE-ACETAMINOPHEN 5-325 MG PO TABS
1.0000 | ORAL_TABLET | ORAL | Status: DC | PRN
  Administered 2015-06-24 – 2015-06-26 (×8): 2 via ORAL
  Filled 2015-06-24 (×9): qty 2

## 2015-06-24 MED ORDER — ACETAMINOPHEN 325 MG PO TABS: 650.0000 mg | ORAL_TABLET | Freq: Four times a day (QID) | ORAL | Status: DC | PRN

## 2015-06-24 NOTE — H&P (Signed)
[facility] History and Physical  Richard Osborn ZOX:[id number] DOB: 12-18-1975 DOA: 06/24/2015  Referring physician: Roxy Horseman, PA PCP: Salli Quarry, PA-C   Chief Complaint:  right leg erythema and swelling x 1 day.  HPI:  40 year old morbidly obese male with history of obstructive sleep apnea on CPAP who presented to the ED with swelling and redness of his right leg since one day. Patient reports waking up yesterday morning with erythema on his right leg below the knees spreading up to the foot not involving the toes. He had pain worsened on ambulation and went to see his PCP who started him on oral doxycycline and sent him to an urgent care to have Doppler of his right leg done to rule out DVT which was reportedly negative. Denies any trauma to the leg, insect bite, sick contact or recent travel. Patient has some nausea but denied any fevers or chills, headache, blurred vision, dizziness, chest pain, palpitation, shortness of breath, abdominal pain, bowel or urinary symptoms. Denies any change in weight or appetite.  This morning he had increased erythema and swelling in the leg with areas of right anterior thigh and right groin being erythematous. He then came to the ED.  In the ED, his vitals were stable. Blood work showed significant leukocytosis with WBC of 26.8K, hemoglobin of 17.4 with normal platelets. Chemistry was unremarkable except for acute kidney injury with creatinine of 1.65. Blood glucose was 107. Lactic acid was normal. Given severe and rapidly progressive cellulitis patient given a dose of IV vancomycin and hospitalist admission requested to medical floor.  Review of Systems:  Constitutional: Denies fever, chills, diaphoresis, appetite change and fatigue.  HEENT: Denies joint or hearing symptoms, congestion, sore throat, mouth sores, difficulty swallowing, neck pain or stiffness   Respiratory: Denies SOB, DOE, cough, chest tightness,  and wheezing.    Cardiovascular: Denies chest pain, palpitations and leg swelling.  Gastrointestinal: Nausea present, denies vomiting, abdominal pain, diarrhea, constipation, blood in stool and abdominal distention.  Genitourinary: Denies dysuria, urgency, frequency, hematuria, flank pain and difficulty urinating.  Endocrine: Denies: hot or cold intolerance,  Musculoskeletal: Right leg pain worsened with movement Denies myalgias, back pain, joint swelling, arthralgias and gait problem.  Skin: Denies pallor, rash and wound.  Neurological: Denies dizziness, seizures, syncope, weakness, light-headedness, numbness and headaches.  Hematological: Denies adenopathy.  Psychiatric/Behavioral: Denies confusion  Past Medical History  Diagnosis Date  . Sleep apnea    Past Surgical History  Procedure Laterality Date  . Nasal septum surgery    . Orif ulnar / radial shaft fracture     Social History:  reports that he has been smoking Cigarettes.  He has a 20 pack-year smoking history. He does not have any smokeless tobacco history on file. He reports that he drinks alcohol. He reports that he does not use illicit drugs.  No Known Allergies  No family history on file.  Prior to Admission medications   Medication Sig Start Date End Date Taking? Authorizing Provider  doxycycline (VIBRAMYCIN) 100 MG capsule Take 100 mg by mouth 2 (two) times daily. ABT Start Date 06/23/15 & End Date 07/02/15. 06/23/15  Yes Historical Provider, MD  testosterone cypionate (DEPOTESTOTERONE CYPIONATE) 200 MG/ML injection Inject 200 mg into the muscle once a week.  01/21/14  Yes Historical Provider, MD  DM-Phenylephrine-Acetaminophen (ALKA-SELTZER PLS SINUS & COUGH PO) Take 1 capsule by mouth every 8 (eight) hours as needed. For cold    Historical Provider, MD     Physical Exam:  Filed Vitals:   06/24/15 1522 06/24/15 1624  BP: 121/69   Pulse: 99   Temp: 98.3 F (36.8 C)   TempSrc: Oral   Resp: 20   Height:   (1.778 m)   Weight:  149.687 kg (330 lb)  SpO2: 97%     Constitutional: Vital signs reviewed.  Related obese male not in distress HEENT: no pallor, no icterus, moist oral mucosa, no cervical lymphadenopathy Cardiovascular: RRR, S1 normal, S2 normal, no MRG Chest: CTAB, no wheezes, rales, or rhonchi Abdominal: Soft. Non-tender, non-distended, bowel sounds are normal,  Skeletal skeletal: Significant erythema with swelling involving entire right leg below the knees up to the toes , streaks of erythema over anterior thigh and over right groin. Has some tenderness but no fluctuation or discharge.  Neurological: Alert and oriented  Labs on Admission:  Basic Metabolic Panel:  Recent Labs Lab 06/24/15 1553  NA 135  K 4.1  CL 105  CO2 19*  GLUCOSE 107*  BUN 19  CREATININE 1.65*  CALCIUM 8.2*   Liver Function Tests:  Recent Labs Lab 06/24/15 1553  AST 47*  ALT 84*  ALKPHOS 61  BILITOT 1.8*  PROT 7.1  ALBUMIN 3.1*   No results for input(s): LIPASE, AMYLASE in the last 168 hours. No results for input(s): AMMONIA in the last 168 hours. CBC:  Recent Labs Lab 06/24/15 1553  WBC 26.8*  NEUTROABS 25.0*  HGB 17.4*  HCT 51.3  MCV 102.2*  PLT 153   Cardiac Enzymes: No results for input(s): CKTOTAL, CKMB, CKMBINDEX, TROPONINI in the last 168 hours. BNP: Invalid input(s): POCBNP CBG: No results for input(s): GLUCAP in the last 168 hours.  Radiological Exams on Admission: No results found.  EKG:   Assessment/Plan Principal Problem:   Cellulitis of right leg Worsened in one day. Was started on oral doxycycline as outpatient. Has significant leukocytosis on presentation. Afebrile. Placed on empiric IV vancomycin. Supportive care with IV fluids, when necessary Vicodin for pain. Head Doppler of his right leg done at the urgent care yesterday and was reportedly negative for DVT. No clinical signs of abscess. Monitor closely. Repeat CBC in a.m.  Active Problems: Acute kidney  injury Possibly associated with acute infection. Patient denies use of NSAIDs as outpatient. Would place on gentle hydration and monitor.    OSA (obstructive sleep apnea) Continue CPAP at bedtime.     Leucocytosis Secondary to severe cellulitis. Monitor with antibiotics.    Morbid obesity (HCC)     Diet: Regular  DVT prophylaxis: sq heparin   Code Status: full code Family communication: none at bedside Disposition Plan:   Eddie North [facility] Pager [phone]  Total time spent on admission :70 minutes  If 7PM-7AM, please contact night-coverage [email or web address] Password [email or web address] 06/24/2015, 5:25 PM

## 2015-06-24 NOTE — Progress Notes (Signed)
Set  Up CPAP by patient bedside.  Auto mode 5-20cm H20, full face mask. Patient stated he would place on himself when ready for bed.

## 2015-06-24 NOTE — Progress Notes (Signed)
Utilization Review completed.  Azilee Pirro RN CM  

## 2015-06-24 NOTE — Progress Notes (Signed)
ANTIBIOTIC CONSULT NOTE - INITIAL  Pharmacy Consult for vancomycin Indication: cellulitis  No Known Allergies  Patient Measurements: Height:  (177.8 cm) Weight: (!) 330 lb (149.687 kg) IBW/kg (Calculated) : 73   Vital Signs: Temp: 98.3 F (36.8 C) (01/25 1522) Temp Source: Oral (01/25 1522) BP: 121/69 mmHg (01/25 1522) Pulse Rate: 99 (01/25 1522) Intake/Output from previous day:   Intake/Output from this shift:    Labs: No results for input(s): WBC, HGB, PLT, LABCREA, CREATININE in the last 72 hours. CrCl cannot be calculated (Patient has no serum creatinine result on file.). No results for input(s): VANCOTROUGH, VANCOPEAK, VANCORANDOM, GENTTROUGH, GENTPEAK, GENTRANDOM, TOBRATROUGH, TOBRAPEAK, TOBRARND, AMIKACINPEAK, AMIKACINTROU, AMIKACIN in the last 72 hours.   Microbiology: No results found for this or any previous visit (from the past 720 hour(s)).  Medical History: Past Medical History  Diagnosis Date  . Sleep apnea    Assessment: Patient's a 40 y.o M presented to the ED on 1/25 with c/o fever/chills and RLE redness and swelling.  Patient reported that he was on doxycycline PTA for cellulitis.  To start vancomycin for leg cellulitis.  -  scr 1.65 (crcl~61 N)  1/25 vanc>>   Goal of Therapy:  Vancomycin trough level 10-15 mcg/ml  Plan:  - vancomycin 2500 mg IV x1, then 1000 mg IV q12h - monitor renal function  Richard Osborn P 06/24/2015,4:29 PM

## 2015-06-24 NOTE — ED Provider Notes (Signed)
CSN: [id number]     Arrival date & time 06/24/15  1439 History   First MD Initiated Contact with Patient 06/24/15 1544     Chief Complaint  Patient presents with  . Cellulitis     (Consider location/radiation/quality/duration/timing/severity/associated sxs/prior Treatment) HPI Comments: Patient presents to the ED with a chief complaint of right leg pain and cellulitis.  Patient seen yesterday by his PCP for leg pain and swelling.  Started on Doxy for cellulitis.  Also had negative doppler DVT study by PCP.  Denies history of DM.  Reports having a mild sore in between his toes.  Has had progressively worsening symptoms throughout the night and day today.  He has been taking his Doxy.  The history is provided by the patient. No language interpreter was used.    Past Medical History  Diagnosis Date  . Sleep apnea    Past Surgical History  Procedure Laterality Date  . Nasal septum surgery    . Orif ulnar / radial shaft fracture     No family history on file. Social History  Substance Use Topics  . Smoking status: Current Every Day Smoker -- 1.00 packs/day for 20 years    Types: Cigarettes  . Smokeless tobacco: Not on file  . Alcohol Use: 0.0 oz/week    0 Standard drinks or equivalent per week    Review of Systems  Skin: Positive for color change.  All other systems reviewed and are negative.     Allergies  Review of patient's allergies indicates no known allergies.  Home Medications   Prior to Admission medications   Medication Sig Start Date End Date Taking? Authorizing Provider  doxycycline (VIBRAMYCIN) 100 MG capsule Take 100 mg by mouth 2 (two) times daily. ABT Start Date 06/23/15 & End Date 07/02/15. 06/23/15  Yes Historical Provider, MD  testosterone cypionate (DEPOTESTOTERONE CYPIONATE) 200 MG/ML injection Inject 200 mg into the muscle once a week.  01/21/14  Yes Historical Provider, MD  DM-Phenylephrine-Acetaminophen (ALKA-SELTZER PLS SINUS & COUGH PO) Take 1 capsule  by mouth every 8 (eight) hours as needed. For cold    Historical Provider, MD   BP 121/69 mmHg  Pulse 99  Temp(Src) 98.3 F (36.8 C) (Oral)  Resp 20  SpO2 97% Physical Exam  Constitutional: He is oriented to person, place, and time. He appears well-developed and well-nourished.  HENT:  Head: Normocephalic and atraumatic.  Eyes: Conjunctivae and EOM are normal. Pupils are equal, round, and reactive to light. Right eye exhibits no discharge. Left eye exhibits no discharge. No scleral icterus.  Neck: Normal range of motion. Neck supple. No JVD present.  Cardiovascular: Normal rate, regular rhythm and normal heart sounds.  Exam reveals no gallop and no friction rub.   No murmur heard. Pulmonary/Chest: Effort normal and breath sounds normal. No respiratory distress. He has no wheezes. He has no rales. He exhibits no tenderness.  Abdominal: Soft. He exhibits no distension and no mass. There is no tenderness. There is no rebound and no guarding.  Musculoskeletal: Normal range of motion. He exhibits no edema or tenderness.  Neurological: He is alert and oriented to person, place, and time.  Skin: Skin is warm and dry.  See picture, no obvious abscess  Psychiatric: He has a normal mood and affect. His behavior is normal. Judgment and thought content normal.  Nursing note and vitals reviewed.     ED Course  Procedures (including critical care time) Results for orders placed or performed during the hospital encounter  of 06/24/15  CBC with Differential  Result Value Ref Range   WBC 26.8 (H) 4.0 - 10.5 K/uL   RBC 5.02 4.22 - 5.81 MIL/uL   Hemoglobin 17.4 (H) 13.0 - 17.0 g/dL   HCT 16.1 09.6 - 04.5 %   MCV 102.2 (H) 78.0 - 100.0 fL   MCH 34.7 (H) 26.0 - 34.0 pg   MCHC 33.9 30.0 - 36.0 g/dL   RDW 40.9 81.1 - 91.4 %   Platelets 153 150 - 400 K/uL   Neutrophils Relative % 93 %   Neutro Abs 25.0 (H) 1.7 - 7.7 K/uL   Lymphocytes Relative 2 %   Lymphs Abs 0.6 (L) 0.7 - 4.0 K/uL   Monocytes  Relative 4 %   Monocytes Absolute 1.1 (H) 0.1 - 1.0 K/uL   Eosinophils Relative 0 %   Eosinophils Absolute 0.1 0.0 - 0.7 K/uL   Basophils Relative 0 %   Basophils Absolute 0.0 0.0 - 0.1 K/uL  Comprehensive metabolic panel  Result Value Ref Range   Sodium 135 135 - 145 mmol/L   Potassium 4.1 3.5 - 5.1 mmol/L   Chloride 105 101 - 111 mmol/L   CO2 19 (L) 22 - 32 mmol/L   Glucose, Bld 107 (H) 65 - 99 mg/dL   BUN 19 6 - 20 mg/dL   Creatinine, Ser 7.82 (H) 0.61 - 1.24 mg/dL   Calcium 8.2 (L) 8.9 - 10.3 mg/dL   Total Protein 7.1 6.5 - 8.1 g/dL   Albumin 3.1 (L) 3.5 - 5.0 g/dL   AST 47 (H) 15 - 41 U/L   ALT 84 (H) 17 - 63 U/L   Alkaline Phosphatase 61 38 - 126 U/L   Total Bilirubin 1.8 (H) 0.3 - 1.2 mg/dL   GFR calc non Af Amer 50 (L) >60 mL/min   GFR calc Af Amer 59 (L) >60 mL/min   Anion gap 11 5 - 15  I-Stat CG4 Lactic Acid, ED  Result Value Ref Range   Lactic Acid, Venous 1.37 0.5 - 2.0 mmol/L   No results found.  I have personally reviewed and evaluated these images and lab results as part of my medical decision-making.   MDM   Final diagnoses:  Cellulitis of right lower extremity    Patient with right leg cellulitis.  No responding to outpatient therapy, but has only been on doxy for one day.  However, give rapid spread, will admit to medicine. Patient seen by and discussed with Dr. Adriana Simas, who agrees with the plan.  VSS.  Will start Vanc.      Roxy Horseman, PA-C 06/25/15 9562  Donnetta Hutching, MD 06/25/15 [phone]

## 2015-06-24 NOTE — ED Notes (Signed)
Pt states that he started feeling bad Tuesday morning at 2 am with chills and fever and now has almost full R leg redness and swelling. Saw PCP yesterday and had a vascular US to R/O DVT which tech said was negative. Was put on Doxycycline but redness has moved from upper calf to upper thigh. Alert and oriented.

## 2015-06-25 LAB — BASIC METABOLIC PANEL
ANION GAP: 8 (ref 5–15)
BUN: 15 mg/dL (ref 6–20)
CALCIUM: 7.5 mg/dL — AB (ref 8.9–10.3)
CO2: 24 mmol/L (ref 22–32)
Chloride: 104 mmol/L (ref 101–111)
Creatinine, Ser: 1.47 mg/dL — ABNORMAL HIGH (ref 0.61–1.24)
GFR, EST NON AFRICAN AMERICAN: 58 mL/min — AB (ref >60)
GLUCOSE: 117 mg/dL — AB (ref 65–99)
POTASSIUM: 4 mmol/L (ref 3.5–5.1)
Sodium: 136 mmol/L (ref 135–145)

## 2015-06-25 MED ORDER — PIPERACILLIN-TAZOBACTAM 3.375 G IVPB
3.3750 g | Freq: Three times a day (TID) | INTRAVENOUS | Status: DC
  Administered 2015-06-25 – 2015-06-26 (×2): 3.375 g via INTRAVENOUS
  Filled 2015-06-25 (×4): qty 50

## 2015-06-25 MED ORDER — PIPERACILLIN-TAZOBACTAM 3.375 G IVPB
3.3750 g | Freq: Three times a day (TID) | INTRAVENOUS | Status: DC
  Filled 2015-06-25: qty 50

## 2015-06-25 MED ORDER — PIPERACILLIN-TAZOBACTAM 3.375 G IVPB 30 MIN
3.3750 g | Freq: Once | INTRAVENOUS | Status: AC
  Administered 2015-06-25: 3.375 g via INTRAVENOUS
  Filled 2015-06-25: qty 50

## 2015-06-25 MED ORDER — HYDROMORPHONE HCL 1 MG/ML IJ SOLN
1.0000 mg | INTRAMUSCULAR | Status: DC | PRN
  Administered 2015-06-25 – 2015-06-26 (×2): 1 mg via INTRAVENOUS
  Filled 2015-06-25 (×2): qty 1

## 2015-06-25 MED ORDER — SODIUM CHLORIDE 0.9 % IV SOLN
1250.0000 mg | Freq: Two times a day (BID) | INTRAVENOUS | Status: DC
  Administered 2015-06-25 – 2015-06-26 (×3): 1250 mg via INTRAVENOUS
  Filled 2015-06-25 (×4): qty 1250

## 2015-06-25 NOTE — Progress Notes (Signed)
Patient states he will place himself on and off of CPAP when he is ready. RT informed patient if he has any trouble to have RN contact RT.

## 2015-06-25 NOTE — Progress Notes (Addendum)
Patient ID: ROSSIE BRETADO, male   DOB: 25-Dec-1975, 40 y.o.   MRN: [id number]  [facility] PROGRESS NOTE  EARLE TROIANO KGU:[id number] DOB: 1975-07-30 DOA: 06/24/2015 PCP: Edmonia James, PA-C   Brief narrative:    40 year old morbidly obese male with history of obstructive sleep apnea on CPAP who presented to the ED with swelling and redness of his right leg one day in duration, no specific trauma to the leg or foot. He also reported subjective fevers, no similar events in the past.   In the ED, his vitals were stable. Blood work showed significant leukocytosis with WBC of 26.8K, hemoglobin of 17.4 with normal platelets. Chemistry was unremarkable except for acute kidney injury with creatinine of 1.65. Patient given a dose of IV vancomycin and hospitalist admission requested to medical floor.  Assessment/Plan:    Principal Problem:  Sepsis secondary to acute cellulitis of right leg - unclear etiology, pt met criteria for sepsis with WBC > 20K, HR > 90, source cellulitis  - not much better - continue vancomycin day #2, add Zosyn today 1/26 - keep extremity elevated   Active Problems: Acute kidney injury - due to acute infection - continue IVF, repeat BMP in AM   OSA (obstructive sleep apnea) - Continue CPAP at bedtime.   Morbid obesity ([place]) - Body mass index is 47.35 kg/(m^2).  DVT prophylaxis - Heparin SQ  Code Status: Full.  Family Communication:  plan of care discussed with the patient Disposition Plan: Home by 1/28 if off IV ABX  IV access:  Peripheral IV  Procedures and diagnostic studies:    No results found.  Medical Consultants:  None  Other Consultants:  None  IAnti-Infectives:   Vancomycin 1/25 --> Zosyn 1/26 -->  Faye Ramsay, MD  [facility] Pager [phone]  If 7PM-7AM, please contact night-coverage [email or web address] Password [facility] 06/25/2015, 2:47 PM   LOS: 1 day   HPI/Subjective: No events overnight.   Objective: Filed Vitals:   06/24/15 1624 06/24/15 1835 06/24/15 2200 06/25/15 0559  BP:  110/68 136/67 109/62  Pulse:  90 93 92  Temp:  98.6 F (37 C) 98.6 F (37 C) 98.1 F (36.7 C)  TempSrc:  Oral Oral Oral  Resp:  '20 20 20  '$ Height: '5\' 10"'$  (1.778 m)     Weight: 149.687 kg (330 lb)     SpO2:  98% 97% 96%    Intake/Output Summary (Last 24 hours) at 06/25/15 1447 Last data filed at 06/25/15 0609  Gross per 24 hour  Intake    960 ml  Output      0 ml  Net    960 ml    Exam:   General:  Pt is alert, follows commands appropriately, not in acute distress  Cardiovascular: Regular rate and rhythm, S1/S2, no murmurs, no rubs, no gallops  Respiratory: Clear to auscultation bilaterally, no wheezing, no crackles, no rhonchi  Abdomen: Soft, non tender, non distended, bowel sounds present, no guarding  Extremities: R LE edema and erythema from foot to knee, warm and tender to palpation   Data Reviewed: Basic Metabolic Panel:  Recent Labs Lab 06/24/15 1553 06/25/15 0515  NA 135 136  K 4.1 4.0  CL 105 104  CO2 19* 24  GLUCOSE 107* 117*  BUN 19 15  CREATININE 1.65* 1.47*  CALCIUM 8.2* 7.5*   Liver Function Tests:  Recent Labs Lab 06/24/15 1553  AST 47*  ALT 84*  ALKPHOS 61  BILITOT 1.8*  PROT 7.1  ALBUMIN 3.1*  CBC:  Recent Labs Lab 06/24/15 1553  WBC 26.8*  NEUTROABS 25.0*  HGB 17.4*  HCT 51.3  MCV 102.2*  PLT 153   Scheduled Meds: . heparin  5,000 Units Subcutaneous 3 times per day  . piperacillin-tazobactam (ZOSYN)  IV  3.375 g Intravenous Q8H  . vancomycin  1,250 mg Intravenous Q12H   Continuous Infusions: . sodium chloride 100 mL/hr at 06/25/15 1135

## 2015-06-25 NOTE — Clinical Documentation Improvement (Signed)
Hospitalist  Can the diagnosis of systemic infection be further specified? Please specify if Sepsis was present on admission.    Sepsis - specify causative organism if known}  Other  Clinically Undetermined  Document any associated diagnoses/conditions.   Supporting Information:  Sepsis secondary to acute cellulitis of right leg- unclear etiology, pt met criteria for sepsis with WBC > 20K, HR > 90, source cellulitis per 01/26 progress notes.    Please exercise your independent, professional judgment when responding. A specific answer is not anticipated or expected.   Thank You,  [place] [phone]

## 2015-06-25 NOTE — Progress Notes (Signed)
ANTIBIOTIC CONSULT NOTE  Pharmacy Consult for vancomycin, Zosyn Indication: cellulitis  No Known Allergies  Patient Measurements: Height:  (177.8 cm) Weight: (!) 330 lb (149.687 kg) IBW/kg (Calculated) : 73   Vital Signs: Temp: 98.1 F (36.7 C) (01/26 0559) Temp Source: Oral (01/26 0559) BP: 109/62 mmHg (01/26 0559) Pulse Rate: 92 (01/26 0559) Intake/Output from previous day: 01/25 0701 - 01/26 0700 In: 960 [P.O.:960] Out: -  Intake/Output from this shift:    Labs:  Recent Labs  06/24/15 1553 06/25/15 0515  WBC 26.8*  --   HGB 17.4*  --   PLT 153  --   CREATININE 1.65* 1.47*   Estimated Creatinine Clearance: 98 mL/min (by C-G formula based on Cr of 1.47). No results for input(s): VANCOTROUGH, VANCOPEAK, VANCORANDOM, GENTTROUGH, GENTPEAK, GENTRANDOM, TOBRATROUGH, TOBRAPEAK, TOBRARND, AMIKACINPEAK, AMIKACINTROU, AMIKACIN in the last 72 hours.   Microbiology: No results found for this or any previous visit (from the past 720 hour(s)).  Medical History: Past Medical History  Diagnosis Date  . Sleep apnea    Assessment: Patient's a 40 y.o M presented to the ED on 1/25 with c/o fever/chills and RLE redness and swelling.  Patient reported that he was on doxycycline PTA for cellulitis.  Started on vancomycin yesterday for leg cellulitis; to add Zosyn today (no MD note yet today but still appears very swollen and erythematous per RN)  1/25 >> vancomycin >> 1/26 >> Zosyn >>    No Cx obtained  Tmax afebrile WBC markedly elevated Renal: SCr elevated (baseline ~1; CrCl 68 N  Goal of Therapy:  Vancomycin trough level 10-15 mcg/ml Eradication of infection Appropriate antibiotic dosing for indication and renal function  Plan:  Day 2 antibiotics  Will increase vancomycin to 1250 mg IV q12 hr with improved renal function Begin Zosyn 3.375 g IV given once over 30 minutes, then every 8 hrs by 4-hr infusion  Follow clinical course, renal function, culture  results as available  Follow for de-escalation of antibiotics and LOT   Bernadene Person, PharmD, BCPS Pager: [phone] 06/25/2015, 11:09 AM

## 2015-06-26 ENCOUNTER — Encounter (HOSPITAL_COMMUNITY): Payer: Self-pay | Admitting: Internal Medicine

## 2015-06-26 DIAGNOSIS — A419 Sepsis, unspecified organism: Secondary | ICD-10-CM | POA: Insufficient documentation

## 2015-06-26 LAB — CBC
HCT: 47.8 % (ref 39.0–52.0)
Hemoglobin: 15.6 g/dL (ref 13.0–17.0)
MCH: 33.6 pg (ref 26.0–34.0)
MCHC: 32.6 g/dL (ref 30.0–36.0)
MCV: 103 fL — ABNORMAL HIGH (ref 78.0–100.0)
PLATELETS: 161 10*3/uL (ref 150–400)
RBC: 4.64 MIL/uL (ref 4.22–5.81)
RDW: 14 % (ref 11.5–15.5)
WBC: 16.7 10*3/uL — AB (ref 4.0–10.5)

## 2015-06-26 LAB — BASIC METABOLIC PANEL
ANION GAP: 7 (ref 5–15)
BUN: 12 mg/dL (ref 6–20)
CO2: 23 mmol/L (ref 22–32)
Calcium: 7.3 mg/dL — ABNORMAL LOW (ref 8.9–10.3)
Chloride: 104 mmol/L (ref 101–111)
Creatinine, Ser: 1.39 mg/dL — ABNORMAL HIGH (ref 0.61–1.24)
Glucose, Bld: 88 mg/dL (ref 65–99)
POTASSIUM: 3.5 mmol/L (ref 3.5–5.1)
SODIUM: 134 mmol/L — AB (ref 135–145)

## 2015-06-26 MED ORDER — OXYCODONE-ACETAMINOPHEN 5-325 MG PO TABS
1.0000 | ORAL_TABLET | ORAL | Status: DC | PRN
  Administered 2015-06-27 – 2015-06-29 (×8): 1 via ORAL
  Filled 2015-06-26 (×8): qty 1

## 2015-06-26 NOTE — Progress Notes (Signed)
Patient ID: Richard Osborn, male   DOB: [birth year], 40 y.o.   MRN: [id number]  [facility] PROGRESS NOTE  Richard Osborn [id number]:[id number] DOB: Jan 31, 1976 DOA: 06/24/2015 PCP: Edmonia James, PA-C   Brief narrative:    40 year old morbidly obese male with history of obstructive sleep apnea on CPAP who presented to the ED with swelling and redness of his right leg one day in duration, no specific trauma to the leg or foot. He also reported subjective fevers, no similar events in the past.   In the ED, his vitals were stable. Blood work showed significant leukocytosis with WBC of 26.8K, hemoglobin of 17.4 with normal platelets. Chemistry was unremarkable except for acute kidney injury with creatinine of 1.65. Patient given a dose of IV vancomycin and hospitalist admission requested to medical floor.  Assessment/Plan:    Principal Problem:  Sepsis secondary to acute cellulitis of right leg - unclear etiology, pt met criteria for sepsis on admission with WBC > 20K, HR > 90, source cellulitis  - not much better even with addition of Zosyn 1/26 - continue vancomycin day #3, Zosyn day #2 - keep extremity elevated  - ortho consulted for assistance   Active Problems: Acute kidney injury - due to acute infection - responding to IVF, Cr trending down - BMP in AM   OSA (obstructive sleep apnea) - Continue CPAP at bedtime.   Morbid obesity ([place]) - Body mass index is 47.35 kg/(m^2).  DVT prophylaxis - Heparin SQ  Code Status: Full.  Family Communication:  plan of care discussed with the patient Disposition Plan: Home by 1/28 if off IV ABX and RLE looks better   IV access:  Peripheral IV  Procedures and diagnostic studies:    No results found.  Medical Consultants:  Nile Dear  Other Consultants:  None  IAnti-Infectives:   Vancomycin 1/25 --> Zosyn 1/26 -->  Faye Ramsay, MD  [facility] Pager [phone]  If 7PM-7AM, please contact night-coverage [email or web address] Password  [email or web address] 06/26/2015, 7:03 AM   LOS: 2 days   HPI/Subjective: No events overnight. RLE looks more swollen and per pt tighter, some blistering in the right heal. Some tingling in the right heal area as well.   Objective: Filed Vitals:   06/25/15 1707 06/25/15 2008 06/25/15 2141 06/26/15 0459  BP: 130/68 126/65  102/54  Pulse: 84 80 81 74  Temp: 98.8 F (37.1 C) 99.3 F (37.4 C)  99 F (37.2 C)  TempSrc: Oral Oral  Oral  Resp: _0 Height:      Weight:      SpO2: 99% 98% 99% 98%    Intake/Output Summary (Last 24 hours) at 06/26/15 0703 Last data filed at 06/26/15 0459  Gross per 24 hour  Intake      0 ml  Output   1075 ml  Net  -1075 ml    Exam:   General:  Pt is alert, follows commands appropriately, not in acute distress  Cardiovascular: Regular rate and rhythm, S1/S2, no murmurs, no rubs, no gallops  Respiratory: Clear to auscultation bilaterally, no wheezing, no crackles, no rhonchi  Abdomen: Soft, non tender, non distended, bowel sounds present, no guarding  Extremities: R LE edema and erythema from foot to knee, warm and tender to palpation   Data Reviewed: Basic Metabolic Panel:  Recent Labs Lab 06/24/15 1553 06/25/15 0515 06/26/15 0601  NA 135 136 134*  K 4.1 4.0 3.5  CL 105 104 104  CO2 19* 24 23  GLUCOSE  107* 117* 88  BUN _0 CREATININE 1.65* 1.47* 1.39*  CALCIUM 8.2* 7.5* 7.3*   Liver Function Tests:  Recent Labs Lab 06/24/15 1553  AST 47*  ALT 84*  ALKPHOS 61  BILITOT 1.8*  PROT 7.1  ALBUMIN 3.1*   CBC:  Recent Labs Lab 06/24/15 1553 06/26/15 0601  WBC 26.8* 16.7*  NEUTROABS 25.0*  --   HGB 17.4* 15.6  HCT 51.3 47.8  MCV 102.2* 103.0*  PLT 153 161   Scheduled Meds: . heparin  5,000 Units Subcutaneous 3 times per day  . piperacillin-tazobactam (ZOSYN)  IV  3.375 g Intravenous Q8H  . vancomycin  1,250 mg Intravenous Q12H   Continuous Infusions: . sodium chloride 75 mL/hr at 06/25/15 1727

## 2015-06-26 NOTE — Progress Notes (Signed)
Pt states he will place himself on cpap when ready for bed; settings charted in Flowsheets

## 2015-06-26 NOTE — Consult Note (Signed)
Patient ID: DOMNICK CHERVENAK MRN: [id number] DOB/AGE: 1976-05-23 40 y.o.  Admit date: 06/24/2015  Admission Diagnoses:  Principal Problem:   Cellulitis of right leg Active Problems:   OSA (obstructive sleep apnea)   Cellulitis   Acute kidney injury (HCC)   Leucocytosis   Morbid obesity due to excess calories (HCC)   HPI: 40 yo wm being seen for right LE cellulitis.  States that he's had increasing swelling, redness, and pain in the right leg x 3 days.  Extends from right groin to foot.  Admits to having a small open wound between the 1st and 2nd toe from athletes foot x 1 month.  No previous problems of this nature before onset.  Was seen by his PCP who prescribed doxycycline po.  Took two doses and decided to go to the ED for evaluation.  Pain with ambulating.    Past Medical History: Past Medical History  Diagnosis Date  . Sleep apnea     Surgical History: Past Surgical History  Procedure Laterality Date  . Nasal septum surgery    . Orif ulnar / radial shaft fracture      Family History: History reviewed. No pertinent family history.  Social History: Social History   Social History  . Marital Status: Divorced    Spouse Name: N/A  . Number of Children: Y  . Years of Education: N/A   Occupational History  . bus driver     for [employer]   Social History Main Topics  . Smoking status: Current Every Day Smoker -- 1.00 packs/day for 20 years    Types: Cigarettes  . Smokeless tobacco: Not on file  . Alcohol Use: 0.0 oz/week    0 Standard drinks or equivalent per week  . Drug Use: No  . Sexual Activity: Not on file   Other Topics Concern  . Not on file   Social History Narrative    Allergies: Review of patient's allergies indicates no known allergies.  Medications: I have reviewed the patient's current medications.  Vital Signs: Patient Vitals for the past 24 hrs:  BP Temp Temp src Pulse Resp SpO2  06/26/15 1059 115/67 mmHg 98.2 F (36.8 C) Oral  81 19 96 %  06/26/15 0459 (!) 102/54 mmHg 99 F (37.2 C) Oral 74 20 98 %  06/25/15 2141 - - - 81 17 99 %  06/25/15 2008 126/65 mmHg 99.3 F (37.4 C) Oral 80 (!) 22 98 %  06/25/15 1707 130/68 mmHg 98.8 F (37.1 C) Oral 84 18 99 %    Radiology: No results found.  Labs:  Recent Labs  06/24/15 1553 06/26/15 0601  WBC 26.8* 16.7*  RBC 5.02 4.64  HCT 51.3 47.8  PLT 153 161    Recent Labs  06/25/15 0515 06/26/15 0601  NA 136 134*  K 4.0 3.5  CL 104 104  CO2 24 23  BUN 15 12  CREATININE 1.47* 1.39*  GLUCOSE 117* 88  CALCIUM 7.5* 7.3*   No results for input(s): LABPT, INR in the last 72 hours.  Review of Systems: Review of Systems  HENT: Negative.   Eyes: Negative.   Respiratory:       Hx sleep apnea  Cardiovascular: Negative.   Genitourinary: Negative.   Musculoskeletal: Positive for joint pain.  Neurological: Negative.   Psychiatric/Behavioral: Negative.     Physical Exam: Pleasant obese wm alert and oriented x 3.  NAD.  Head is normocephalic.  No respiratory distress.  EOMI.  Neg log  roll right hip.  Diffuse Right leg erythema groin to foot.  Diffuse leg tenderness.  3+ pretib pitting edema. Pedal pulses nonpalpable due to swelling.  Right foot he has very small superficial sores between 1st and 2nd toes. No drainage.    Assessment and Plan: Right LE cellulitis.  Will continue IV abx.  Elevate foot above heart level as much as possible.  WBC improved 26.8 two days ago to 16.7 today.  Will continue to follow.  Patient seen and examined with my attending Dr Annell Greening.     Genene Churn. Jeanella Craze Annell Greening MD [facility] [phone] All labs reviewed. Pt examined. Cellulitis with soft compartments. Continue IV ABX. Toe fungal infection with small cuts likely cause of cellulitis.    Will follow this weekend.   Ophelia Charter [phone]

## 2015-06-26 NOTE — Progress Notes (Deleted)
Subjective:     Patient reports pain as 4 on 0-10 scale.    Objective: Vital signs in last 24 hours: Temp:  [98.8 F (37.1 C)-99.3 F (37.4 C)] 99 F (37.2 C) (01/27 0459) Pulse Rate:  [74-84] 74 (01/27 0459) Resp:  [17-22] 20 (01/27 0459) BP: (102-130)/(54-68) 102/54 mmHg (01/27 0459) SpO2:  [98 %-99 %] 98 % (01/27 0459)  Intake/Output from previous day: 01/26 0701 - 01/27 0700 In: -  Out: 1075 [Urine:1075] Intake/Output this shift:     Recent Labs  06/24/15 1553 06/26/15 0601  HGB 17.4* 15.6    Recent Labs  06/24/15 1553 06/26/15 0601  WBC 26.8* 16.7*  RBC 5.02 4.64  HCT 51.3 47.8  PLT 153 161    Recent Labs  06/25/15 0515 06/26/15 0601  NA 136 134*  K 4.0 3.5  CL 104 104  CO2 24 23  BUN 15 12  CREATININE 1.47* 1.39*  GLUCOSE 117* 88  CALCIUM 7.5* 7.3*   No results for input(s): LABPT, INR in the last 72 hours.  Neurologically intact   Less erythema, bactroban applied then splint re-applied. Medial side looks better.   Assessment/Plan:     Continue ABX therapy due to Post-op infection cellulitis  Will stop zosyn  Lloyd Cullinan C 06/26/2015, 10:01 AM

## 2015-06-27 DIAGNOSIS — N179 Acute kidney failure, unspecified: Secondary | ICD-10-CM

## 2015-06-27 DIAGNOSIS — L0390 Cellulitis, unspecified: Secondary | ICD-10-CM

## 2015-06-27 DIAGNOSIS — G4733 Obstructive sleep apnea (adult) (pediatric): Secondary | ICD-10-CM

## 2015-06-27 DIAGNOSIS — L03115 Cellulitis of right lower limb: Secondary | ICD-10-CM

## 2015-06-27 DIAGNOSIS — A419 Sepsis, unspecified organism: Principal | ICD-10-CM

## 2015-06-27 LAB — BASIC METABOLIC PANEL
Anion gap: 8 (ref 5–15)
BUN: 9 mg/dL (ref 6–20)
CO2: 24 mmol/L (ref 22–32)
CREATININE: 1.14 mg/dL (ref 0.61–1.24)
Calcium: 7.7 mg/dL — ABNORMAL LOW (ref 8.9–10.3)
Chloride: 105 mmol/L (ref 101–111)
Glucose, Bld: 85 mg/dL (ref 65–99)
Potassium: 3.7 mmol/L (ref 3.5–5.1)
SODIUM: 137 mmol/L (ref 135–145)

## 2015-06-27 LAB — CBC
HCT: 48.5 % (ref 39.0–52.0)
Hemoglobin: 16.4 g/dL (ref 13.0–17.0)
MCH: 33.9 pg (ref 26.0–34.0)
MCHC: 33.8 g/dL (ref 30.0–36.0)
MCV: 100.2 fL — ABNORMAL HIGH (ref 78.0–100.0)
PLATELETS: 207 10*3/uL (ref 150–400)
RBC: 4.84 MIL/uL (ref 4.22–5.81)
RDW: 13.7 % (ref 11.5–15.5)
WBC: 14.4 10*3/uL — ABNORMAL HIGH (ref 4.0–10.5)

## 2015-06-27 LAB — VANCOMYCIN, TROUGH: VANCOMYCIN TR: 7 ug/mL — AB (ref 10.0–20.0)

## 2015-06-27 MED ORDER — GUAIFENESIN ER 600 MG PO TB12
600.0000 mg | ORAL_TABLET | Freq: Two times a day (BID) | ORAL | Status: DC
  Administered 2015-06-27 – 2015-06-29 (×5): 600 mg via ORAL
  Filled 2015-06-27 (×6): qty 1

## 2015-06-27 MED ORDER — VANCOMYCIN HCL 10 G IV SOLR
1500.0000 mg | Freq: Two times a day (BID) | INTRAVENOUS | Status: DC
  Administered 2015-06-27 – 2015-06-29 (×5): 1500 mg via INTRAVENOUS
  Filled 2015-06-27 (×5): qty 1500

## 2015-06-27 NOTE — Progress Notes (Signed)
ANTIBIOTIC CONSULT NOTE  Pharmacy Consult for vancomycin, Zosyn Indication: cellulitis  No Known Allergies  Patient Measurements: Height:  (177.8 cm) Weight: (!) 330 lb (149.687 kg) IBW/kg (Calculated) : 73   Vital Signs: Temp: 98.2 F (36.8 C) (01/28 0605) Temp Source: Oral (01/28 0605) BP: 141/82 mmHg (01/28 0605) Pulse Rate: 85 (01/28 0605) Intake/Output from previous day: 01/27 0701 - 01/28 0700 In: 840 [P.O.:240; I.V.:600] Out: -  Intake/Output from this shift:    Labs:  Recent Labs  06/24/15 1553 06/25/15 0515 06/26/15 0601 06/27/15 0514  WBC 26.8*  --  16.7* 14.4*  HGB 17.4*  --  15.6 16.4  PLT 153  --  161 207  CREATININE 1.65* 1.47* 1.39* 1.14   Estimated Creatinine Clearance: 126.3 mL/min (by C-G formula based on Cr of 1.14).  Recent Labs  06/27/15 0514  VANCOTROUGH 7*     Microbiology: No results found for this or any previous visit (from the past 720 hour(s)).  Medical History: Past Medical History  Diagnosis Date  . Sleep apnea    Assessment: Patient's a 39 y.o M presented to the ED on 1/25 with c/o fever/chills and RLE redness and swelling.  Patient reported that he was on doxycycline PTA for cellulitis.  Day #4 Vancomycin for leg cellulitis.  1/25 >> vancomycin >> 1/26 >> Zosyn >>  1/27  Trough levels: 1/28 @ 05:14 = 7 mcg/ml on  IV q12h  No Cx obtained  Tmax afebrile WBC decreasing Renal: SCr decreasing (Scr = 1.14 ; CrCl 87 N)  Goal of Therapy:  Vancomycin trough level 10-15 mcg/ml Eradication of infection Appropriate antibiotic dosing for indication and renal function  Plan:    Will increase vancomycin to 1500 mg IV q12 hr with improved renal function  Follow clinical course, renal function, culture results as available  Follow for de-escalation of antibiotics and LOT   Terrilee Files, PharmD  06/27/2015, 6:34 AM

## 2015-06-27 NOTE — Progress Notes (Signed)
Pt will place himself on cpap when ready for bed; encouraged to call for assistance.

## 2015-06-27 NOTE — Progress Notes (Signed)
Patient ID: Richard Osborn, male   DOB: September 25, 1975, 40 y.o.   MRN: [id number] [facility] PROGRESS NOTE  SHEIKH LEVERICH XBD:[id number] DOB: 1976/03/31 DOA: 06/24/2015 PCP: Edmonia James, PA-C  Brief narrative:    40 year old morbidly obese male with history of obstructive sleep apnea on CPAP who presented to the ED with swelling and redness of his right leg one day in duration, no specific trauma to the leg or foot. He also reported subjective fevers, no similar events in the past.   In the ED, his vitals were stable. Blood work showed significant leukocytosis with WBC of 26.8K, hemoglobin of 17.4 with normal platelets. Cr was 1.65.  Assessment/Plan:    Principal Problem:  Sepsis secondary to acute cellulitis of right leg / Leukocytosis - Sepsis criteria met on admission with source of infection RLE cellulitis - On vanco and zosyn - Ortho following, appreciate their recommendations    Active Problems: Acute kidney injury - Due to sepsis - Resolved with IV fluids   OSA (obstructive sleep apnea) - Continue CPAP at bedtime.   Morbid obesity due to excess calories (HCC) - Body mass index is 47.35 kg/(m^2). - Counseled on diet   DVT Prophylaxis  - Heparin subQ    Code Status: Full.  Family Communication:  plan of care discussed with the patient Disposition Plan: Home when cellulitis better possibly by 2/1  IV access:  Peripheral IV  Procedures and diagnostic studies:    No results found.  Medical Consultants:  Ortho  Other Consultants:  None   IAnti-Infectives:   Vancomycin 1/25 --> Zosyn 1/26 -->   Teliah Buffalo, MD  [facility] Pager [phone]  Time spent in minutes: 25 minutes  If 7PM-7AM, please contact night-coverage [email or web address] Password [email or web address] 06/27/2015, 2:01 PM   LOS: 3 days    HPI/Subjective: No acute overnight events. Patient reports he can finally move his toes.  Objective: Filed Vitals:   06/26/15 1059 06/26/15 1300  06/26/15 2230 06/27/15 0605  BP: 115/67 119/70 106/77 141/82  Pulse: 81 81 62 85  Temp: 98.2 F (36.8 C) 98.1 F (36.7 C) 98.1 F (36.7 C) 98.2 F (36.8 C)  TempSrc: Oral Oral Oral Oral  Resp: _0 Height:      Weight:      SpO2: 96% 97% 100% 95%    Intake/Output Summary (Last 24 hours) at 06/27/15 1401 Last data filed at 06/27/15 0900  Gross per 24 hour  Intake    840 ml  Output      0 ml  Net    840 ml    Exam:   General:  Pt is alert, follows commands appropriately, not in acute distress  Cardiovascular: Regular rate and rhythm, S1/S2, no murmurs  Respiratory: Clear to auscultation bilaterally, no wheezing, no crackles, no rhonchi  Abdomen: Soft, non tender, non distended, bowel sounds present  Extremities: significant erythema in RLE with edema, pulses DP and PT palpable bilaterally  Neuro: Grossly nonfocal  Data Reviewed: Basic Metabolic Panel:  Recent Labs Lab 06/24/15 1553 06/25/15 0515 06/26/15 0601 06/27/15 0514  NA 135 136 134* 137  K 4.1 4.0 3.5 3.7  CL 105 104 104 105  CO2 19* _1 GLUCOSE 107* 117* 88 85  BUN _2 CREATININE 1.65* 1.47* 1.39* 1.14  CALCIUM 8.2* 7.5* 7.3* 7.7*   Liver Function Tests:  Recent Labs Lab 06/24/15 1553  AST 47*  ALT 84*  ALKPHOS 61  BILITOT  1.8*  PROT 7.1  ALBUMIN 3.1*   No results for input(s): LIPASE, AMYLASE in the last 168 hours. No results for input(s): AMMONIA in the last 168 hours. CBC:  Recent Labs Lab 06/24/15 1553 06/26/15 0601 06/27/15 0514  WBC 26.8* 16.7* 14.4*  NEUTROABS 25.0*  --   --   HGB 17.4* 15.6 16.4  HCT 51.3 47.8 48.5  MCV 102.2* 103.0* 100.2*  PLT 153 161 207   Cardiac Enzymes: No results for input(s): CKTOTAL, CKMB, CKMBINDEX, TROPONINI in the last 168 hours. BNP: Invalid input(s): POCBNP CBG: No results for input(s): GLUCAP in the last 168 hours.  No results found for this or any previous visit (from the past 240 hour(s)).   Scheduled  Meds: . guaiFENesin  600 mg Oral BID  . heparin  5,000 Units Subcutaneous 3 times per day  . vancomycin  1,500 mg Intravenous Q12H   Continuous Infusions: . sodium chloride 75 mL/hr at 06/27/15 0346

## 2015-06-27 NOTE — Progress Notes (Signed)
Subjective:     Patient reports pain as mild.  Less pain.  " I can move my ankle and my toes now"  Objective: Vital signs in last 24 hours: Temp:  [98.1 F (36.7 C)-98.2 F (36.8 C)] 98.2 F (36.8 C) (01/28 0605) Pulse Rate:  [62-85] 85 (01/28 0605) Resp:  [21-22] 21 (01/28 0605) BP: (106-141)/(70-82) 141/82 mmHg (01/28 0605) SpO2:  [95 %-100 %] 95 % (01/28 0605)  Intake/Output from previous day: 01/27 0701 - 01/28 0700 In: 840 [P.O.:240; I.V.:600] Out: -  Intake/Output this shift: Total I/O In: 240 [P.O.:240] Out: -    Recent Labs  06/24/15 1553 06/26/15 0601 06/27/15 0514  HGB 17.4* 15.6 16.4    Recent Labs  06/26/15 0601 06/27/15 0514  WBC 16.7* 14.4*  RBC 4.64 4.84  HCT 47.8 48.5  PLT 161 207    Recent Labs  06/26/15 0601 06/27/15 0514  NA 134* 137  K 3.5 3.7  CL 104 105  CO2 23 24  BUN 12 9  CREATININE 1.39* 1.14  GLUCOSE 88 85  CALCIUM 7.3* 7.7*   No results for input(s): LABPT, INR in the last 72 hours.  cellulitis about the same . movement of the ankle without pain which is an improvement.   Assessment/Plan:     Cont IV ABX for cellulitis, improved. Compartments soft.   Will sign off. He can see me in office in 2 wks . Will need 7 days ABX PO . When discharged.  Do not think he is ready to stop IV ABX today.  My cell [phone]  Richard Osborn 06/27/2015, 12:51 PM

## 2015-06-28 DIAGNOSIS — D72829 Elevated white blood cell count, unspecified: Secondary | ICD-10-CM

## 2015-06-28 NOTE — Progress Notes (Signed)
Patient ID: Richard Osborn, male   DOB: Jul 25, 1975, 40 y.o.   MRN: [id number] [facility] PROGRESS NOTE  PRATEEK KNIPPLE WOE:[id number] DOB: March 18, 1976 DOA: 06/24/2015 PCP: Edmonia James, PA-C  Brief narrative:    40 year old morbidly obese male with history of obstructive sleep apnea on CPAP who presented to the ED with swelling and redness of his right leg one day in duration, no specific trauma to the leg or foot. He also reported subjective fevers, no similar events in the past.   In the ED, his vitals were stable. Blood work showed significant leukocytosis with WBC of 26.8K, hemoglobin of 17.4 with normal platelets. Cr was 1.65.  Assessment/Plan:    Principal Problem:  Sepsis secondary to acute cellulitis of right leg / Leukocytosis - Sepsis criteria met on admission with source of infection RLE cellulitis - On vanco and zosyn - Appreciate orthopedic surgery following - Cellulitis is improving   Active Problems: Acute kidney injury - Likely related to sepsis - Creatinine now within normal limits, improved with fluids   OSA (obstructive sleep apnea) - Continue CPAP at bedtime.   Morbid obesity due to excess calories (HCC) - Body mass index is 47.35 kg/(m^2). - Counseled on diet   DVT Prophylaxis  - Heparin subQ in hospital   Code Status: Full.  Family Communication:  plan of care discussed with the patient Disposition Plan: Home when cellulitis better possibly tomorrow or 06/30/2015  IV access:  Peripheral IV  Procedures and diagnostic studies:    No results found.  Medical Consultants:  Ortho  Other Consultants:  None   IAnti-Infectives:   Vancomycin 1/25 --> Zosyn 1/26 -->   Samhita Kretsch, MD  [facility] Pager [phone]  Time spent in minutes: 25 minutes  If 7PM-7AM, please contact night-coverage [email or web address] Password [email or web address] 06/28/2015, 12:00 PM   LOS: 4 days    HPI/Subjective: No acute overnight events. Patient reports  redness and pain improving  Objective: Filed Vitals:   06/27/15 0605 06/27/15 1400 06/27/15 2057 06/28/15 0500  BP: 141/82 140/73 138/63 138/66  Pulse: 85 75 80 69  Temp: 98.2 F (36.8 C) 98.3 F (36.8 C) 98.3 F (36.8 C) 98.2 F (36.8 C)  TempSrc: Oral  Oral Oral  Resp: _0 Height:      Weight:      SpO2: 95% 99% 98% 98%    Intake/Output Summary (Last 24 hours) at 06/28/15 1200 Last data filed at 06/28/15 0900  Gross per 24 hour  Intake   3730 ml  Output    725 ml  Net   3005 ml    Exam:   General:  Pt is alert, not in acute distress  Cardiovascular: Rate control, S1/S2 appreciated  Respiratory: no wheezing, no crackles, no rhonchi  Abdomen: Appreciate bowel sounds, nontender  Extremities: Right lower extremity erythema improving, pulses palpable  Neuro: Nonfocal  Data Reviewed: Basic Metabolic Panel:  Recent Labs Lab 06/24/15 1553 06/25/15 0515 06/26/15 0601 06/27/15 0514  NA 135 136 134* 137  K 4.1 4.0 3.5 3.7  CL 105 104 104 105  CO2 19* _1 GLUCOSE 107* 117* 88 85  BUN _2 CREATININE 1.65* 1.47* 1.39* 1.14  CALCIUM 8.2* 7.5* 7.3* 7.7*   Liver Function Tests:  Recent Labs Lab 06/24/15 1553  AST 47*  ALT 84*  ALKPHOS 61  BILITOT 1.8*  PROT 7.1  ALBUMIN 3.1*   No results for input(s): LIPASE, AMYLASE in the  last 168 hours. No results for input(s): AMMONIA in the last 168 hours. CBC:  Recent Labs Lab 06/24/15 1553 06/26/15 0601 06/27/15 0514  WBC 26.8* 16.7* 14.4*  NEUTROABS 25.0*  --   --   HGB 17.4* 15.6 16.4  HCT 51.3 47.8 48.5  MCV 102.2* 103.0* 100.2*  PLT 153 161 207   Cardiac Enzymes: No results for input(s): CKTOTAL, CKMB, CKMBINDEX, TROPONINI in the last 168 hours. BNP: Invalid input(s): POCBNP CBG: No results for input(s): GLUCAP in the last 168 hours.  No results found for this or any previous visit (from the past 240 hour(s)).   Scheduled Meds: . guaiFENesin  600 mg Oral BID  . heparin   5,000 Units Subcutaneous 3 times per day  . vancomycin  1,500 mg Intravenous Q12H   Continuous Infusions: . sodium chloride 75 mL/hr at 06/27/15 1841

## 2015-06-29 MED ORDER — ACETAMINOPHEN 325 MG PO TABS: 650.0000 mg | ORAL_TABLET | Freq: Four times a day (QID) | ORAL | Status: DC | PRN

## 2015-06-29 MED ORDER — CLINDAMYCIN HCL 300 MG PO CAPS: 300.0000 mg | ORAL_CAPSULE | Freq: Three times a day (TID) | ORAL | Status: DC

## 2015-06-29 NOTE — Progress Notes (Signed)
Pt VSS. Pain managed. Discharge summary and follow up care reviewed at bedside. Medication regimen reviewed. Printed prescriptions provided to pt. Work note also provided to pt. No further questions.

## 2015-06-29 NOTE — Discharge Instructions (Signed)

## 2015-06-29 NOTE — Discharge Summary (Addendum)
Physician Discharge Summary  Richard Osborn:[id number] DOB: [birth year] DOA: 06/24/2015  PCP: Edmonia James, PA-C  Admit date: 06/24/2015 Discharge date: 06/29/2015  Recommendations for Outpatient Follow-up:  Continue Clindamycin 300 mg three times a day for 10 days on discharge. Follow up with PCP in 2 weeks after discharge to make sure cellulitis is improving.  Discharge Diagnoses:  Principal Problem:   Cellulitis of right leg Active Problems:   OSA (obstructive sleep apnea)   Cellulitis   Acute kidney injury ([place])   Leucocytosis   Morbid obesity due to excess calories ([place])    Discharge Condition: stable   Diet recommendation: as tolerated   History of present illness:  40 year old morbidly obese male with history of obstructive sleep apnea on CPAP who presented to the ED with swelling and redness of his right leg one day in duration, no specific trauma to the leg or foot. He also reported subjective fevers, no similar events in the past.   In the ED, his vitals were stable. Blood work showed significant leukocytosis with WBC of 26.8K, hemoglobin of 17.4 with normal platelets. Cr was 1.65.  Hospital Course:   Assessment/Plan:    Principal Problem:  Sepsis secondary to acute cellulitis of right leg, unspecified organism / Leukocytosis - Sepsis criteria met on admission with source of infection RLE cellulitis - On vanco and zosyn which we will stop today - Start clindamycin 300 mg TID for 10 days on discharge  - Cellulitis is improving   Active Problems: Acute kidney injury - Likely related to sepsis - Creatinine improved with IV fluids    OSA (obstructive sleep apnea) - Continue CPAP at bedtime.   Morbid obesity due to excess calories (HCC) - Body mass index is 47.35 kg/(m^2). - Counseled on diet   DVT Prophylaxis  - Heparin subQ   Code Status: Full.  Family Communication: plan of care discussed with the patient   IV access:  Peripheral  IV  Procedures and diagnostic studies:   No results found.  Medical Consultants:  Ortho  Other Consultants:  None   IAnti-Infectives:   Vancomycin 1/25 --> 06/28/2015 Zosyn 1/26 --> 06/28/2015   Signed:  Leisa Lenz, MD  [facility] 06/29/2015, 10:53 AM  Pager #: [phone]  Time spent in minutes: more than 30 minutes   Discharge Exam: Filed Vitals:   06/28/15 2236 06/29/15 0516  BP: 147/89 122/76  Pulse:  60  Temp: 98.2 F (36.8 C) 97.9 F (36.6 C)  Resp: 20 20   Filed Vitals:   06/28/15 0500 06/28/15 1400 06/28/15 2236 06/29/15 0516  BP: 138/66 135/80 147/89 122/76  Pulse: 69 86  60  Temp: 98.2 F (36.8 C) 99 F (37.2 C) 98.2 F (36.8 C) 97.9 F (36.6 C)  TempSrc: Oral Oral Oral Axillary  Resp: _0 Height:      Weight:      SpO2: 98% 96% 100% 100%    General: Pt is alert, follows commands appropriately, not in acute distress Cardiovascular: Regular rate and rhythm, S1/S2 +, no murmurs Respiratory: Clear to auscultation bilaterally, no wheezing, no crackles, no rhonchi Abdominal: Soft, non tender, non distended, bowel sounds +, no guarding Extremities: no edema, no cyanosis, pulses palpable bilaterally DP and PT Neuro: Grossly nonfocal  Discharge Instructions  Discharge Instructions    Call MD for:  difficulty breathing, headache or visual disturbances    Complete by:  As directed      Call MD for:  persistant dizziness  or light-headedness    Complete by:  As directed      Call MD for:  persistant nausea and vomiting    Complete by:  As directed      Call MD for:  severe uncontrolled pain    Complete by:  As directed      Diet - low sodium heart healthy    Complete by:  As directed      Discharge instructions    Complete by:  As directed   Continue Clindamycin 300 mg three times a day for 10 days on discharge. Follow up with PCP in 2 weeks after discharge to make sure cellulitis is improving.     Increase  activity slowly    Complete by:  As directed             Medication List    STOP taking these medications        doxycycline 100 MG capsule  Commonly known as:  VIBRAMYCIN      TAKE these medications        acetaminophen 325 MG tablet  Commonly known as:  TYLENOL  Take 2 tablets (650 mg total) by mouth every 6 (six) hours as needed for mild pain (or Fever >/= 101).     ALKA-SELTZER PLS SINUS & COUGH PO  Take 1 capsule by mouth every 8 (eight) hours as needed. For cold     clindamycin 300 MG capsule  Commonly known as:  CLEOCIN  Take 1 capsule (300 mg total) by mouth 3 (three) times daily.     testosterone cypionate 200 MG/ML injection  Commonly known as:  DEPOTESTOSTERONE CYPIONATE  Inject 200 mg into the muscle once a week.           Follow-up Information    Follow up with Edmonia James, PA-C. Schedule an appointment as soon as possible for a visit in 2 weeks.   Specialty:  Physician Assistant   Why:  Follow up appt after recent hospitalization   Contact information:   [address] [place] [address] [phone]        The results of significant diagnostics from this hospitalization (including imaging, microbiology, ancillary and laboratory) are listed below for reference.    Significant Diagnostic Studies: No results found.  Microbiology: No results found for this or any previous visit (from the past 240 hour(s)).   Labs: Basic Metabolic Panel:  Recent Labs Lab 06/24/15 1553 06/25/15 0515 06/26/15 0601 06/27/15 0514  NA 135 136 134* 137  K 4.1 4.0 3.5 3.7  CL 105 104 104 105  CO2 19* _0 GLUCOSE 107* 117* 88 85  BUN _1 CREATININE 1.65* 1.47* 1.39* 1.14  CALCIUM 8.2* 7.5* 7.3* 7.7*   Liver Function Tests:  Recent Labs Lab 06/24/15 1553  AST 47*  ALT 84*  ALKPHOS 61  BILITOT 1.8*  PROT 7.1  ALBUMIN 3.1*   No results for input(s): LIPASE, AMYLASE in the last 168 hours. No results for input(s): AMMONIA in the  last 168 hours. CBC:  Recent Labs Lab 06/24/15 1553 06/26/15 0601 06/27/15 0514  WBC 26.8* 16.7* 14.4*  NEUTROABS 25.0*  --   --   HGB 17.4* 15.6 16.4  HCT 51.3 47.8 48.5  MCV 102.2* 103.0* 100.2*  PLT 153 161 207   Cardiac Enzymes: No results for input(s): CKTOTAL, CKMB, CKMBINDEX, TROPONINI in the last 168 hours. BNP: BNP (last 3 results) No results for input(s): BNP in the  last 8760 hours.  ProBNP (last 3 results) No results for input(s): PROBNP in the last 8760 hours.  CBG: No results for input(s): GLUCAP in the last 168 hours.

## 2015-07-06 ENCOUNTER — Encounter (HOSPITAL_COMMUNITY): Payer: Self-pay | Admitting: Cardiology

## 2015-07-06 ENCOUNTER — Inpatient Hospital Stay (HOSPITAL_COMMUNITY)
Admission: EM | Admit: 2015-07-06 | Discharge: 2015-07-09 | DRG: 603 | Disposition: A | Discharge status: Skilled Nursing Facility | Payer: BLUE CROSS/BLUE SHIELD | Attending: Family Medicine | Admitting: Family Medicine

## 2015-07-06 DIAGNOSIS — F17210 Nicotine dependence, cigarettes, uncomplicated: Secondary | ICD-10-CM | POA: Diagnosis present

## 2015-07-06 DIAGNOSIS — R609 Edema, unspecified: Secondary | ICD-10-CM

## 2015-07-06 DIAGNOSIS — L03115 Cellulitis of right lower limb: Principal | ICD-10-CM | POA: Diagnosis present

## 2015-07-06 DIAGNOSIS — N182 Chronic kidney disease, stage 2 (mild): Secondary | ICD-10-CM | POA: Diagnosis present

## 2015-07-06 DIAGNOSIS — G4733 Obstructive sleep apnea (adult) (pediatric): Secondary | ICD-10-CM | POA: Diagnosis present

## 2015-07-06 DIAGNOSIS — I950 Idiopathic hypotension: Secondary | ICD-10-CM

## 2015-07-06 DIAGNOSIS — Z6841 Body Mass Index (BMI) 40.0 and over, adult: Secondary | ICD-10-CM

## 2015-07-06 DIAGNOSIS — I959 Hypotension, unspecified: Secondary | ICD-10-CM | POA: Diagnosis present

## 2015-07-06 LAB — CBC
HEMATOCRIT: 49.8 % (ref 39.0–52.0)
HEMOGLOBIN: 17 g/dL (ref 13.0–17.0)
MCH: 33.9 pg (ref 26.0–34.0)
MCHC: 34.1 g/dL (ref 30.0–36.0)
MCV: 99.4 fL (ref 78.0–100.0)
Platelets: 415 10*3/uL — ABNORMAL HIGH (ref 150–400)
RBC: 5.01 MIL/uL (ref 4.22–5.81)
RDW: 13.6 % (ref 11.5–15.5)
WBC: 9.3 10*3/uL (ref 4.0–10.5)

## 2015-07-06 LAB — BASIC METABOLIC PANEL
ANION GAP: 12 (ref 5–15)
BUN: 8 mg/dL (ref 6–20)
CHLORIDE: 106 mmol/L (ref 101–111)
CO2: 22 mmol/L (ref 22–32)
Calcium: 8.6 mg/dL — ABNORMAL LOW (ref 8.9–10.3)
Creatinine, Ser: 1.2 mg/dL (ref 0.61–1.24)
GFR calc non Af Amer: >60 mL/min (ref >60)
Glucose, Bld: 92 mg/dL (ref 65–99)
Potassium: 4 mmol/L (ref 3.5–5.1)
Sodium: 140 mmol/L (ref 135–145)

## 2015-07-06 MED ORDER — SODIUM CHLORIDE 0.9 % IV BOLUS (SEPSIS)
1000.0000 mL | Freq: Once | INTRAVENOUS | Status: AC
  Administered 2015-07-06: 1000 mL via INTRAVENOUS

## 2015-07-06 MED ORDER — ACETAMINOPHEN 325 MG PO TABS
650.0000 mg | ORAL_TABLET | Freq: Four times a day (QID) | ORAL | Status: DC | PRN
Start: 2015-07-06 — End: 2015-07-09

## 2015-07-06 MED ORDER — CEFAZOLIN SODIUM-DEXTROSE 2-3 GM-% IV SOLR
2.0000 g | Freq: Three times a day (TID) | INTRAVENOUS | Status: DC
  Administered 2015-07-06 – 2015-07-09 (×10): 2 g via INTRAVENOUS
  Filled 2015-07-06 (×12): qty 50

## 2015-07-06 MED ORDER — MORPHINE SULFATE (PF) 4 MG/ML IV SOLN
4.0000 mg | Freq: Once | INTRAVENOUS | Status: AC
  Administered 2015-07-06: 4 mg via INTRAVENOUS
  Filled 2015-07-06: qty 1

## 2015-07-06 MED ORDER — MORPHINE SULFATE (PF) 2 MG/ML IV SOLN
2.0000 mg | INTRAVENOUS | Status: DC | PRN
  Administered 2015-07-06 (×2): 2 mg via INTRAVENOUS
  Filled 2015-07-06 (×2): qty 1

## 2015-07-06 MED ORDER — ONDANSETRON HCL 4 MG/2ML IJ SOLN
4.0000 mg | Freq: Once | INTRAMUSCULAR | Status: AC
  Administered 2015-07-06: 4 mg via INTRAVENOUS
  Filled 2015-07-06: qty 2

## 2015-07-06 MED ORDER — ENOXAPARIN SODIUM 40 MG/0.4ML ~~LOC~~ SOLN
40.0000 mg | SUBCUTANEOUS | Status: DC
  Filled 2015-07-06: qty 0.4

## 2015-07-06 MED ORDER — VANCOMYCIN HCL IN DEXTROSE 1-5 GM/200ML-% IV SOLN
1000.0000 mg | Freq: Once | INTRAVENOUS | Status: AC
  Administered 2015-07-06: 1000 mg via INTRAVENOUS
  Filled 2015-07-06: qty 200

## 2015-07-06 MED ORDER — ONDANSETRON HCL 4 MG PO TABS: 4.0000 mg | ORAL_TABLET | Freq: Four times a day (QID) | ORAL | Status: DC | PRN

## 2015-07-06 MED ORDER — ONDANSETRON HCL 4 MG/2ML IJ SOLN
4.0000 mg | Freq: Four times a day (QID) | INTRAMUSCULAR | Status: DC | PRN
  Administered 2015-07-06: 4 mg via INTRAVENOUS
  Filled 2015-07-06: qty 2

## 2015-07-06 MED ORDER — ENOXAPARIN SODIUM 80 MG/0.8ML ~~LOC~~ SOLN
75.0000 mg | SUBCUTANEOUS | Status: DC
  Administered 2015-07-06 – 2015-07-08 (×3): 75 mg via SUBCUTANEOUS
  Filled 2015-07-06 (×3): qty 0.8

## 2015-07-06 MED ORDER — ACETAMINOPHEN 650 MG RE SUPP: 650.0000 mg | Freq: Four times a day (QID) | RECTAL | Status: DC | PRN

## 2015-07-06 MED ORDER — HYDROCODONE-ACETAMINOPHEN 5-325 MG PO TABS
1.0000 | ORAL_TABLET | ORAL | Status: DC | PRN
  Administered 2015-07-06 – 2015-07-07 (×4): 2 via ORAL
  Administered 2015-07-08: 1 via ORAL
  Administered 2015-07-08 – 2015-07-09 (×3): 2 via ORAL
  Filled 2015-07-06 (×5): qty 2
  Filled 2015-07-06: qty 1
  Filled 2015-07-06 (×2): qty 2

## 2015-07-06 NOTE — ED Provider Notes (Signed)
CSN: [id number]     Arrival date & time 07/06/15  4098 History   First MD Initiated Contact with Patient 07/06/15 [phone]     Chief Complaint  Patient presents with  . Cellulitis     (Consider location/radiation/quality/duration/timing/severity/associated sxs/prior Treatment) HPI  Pt presenting with recurrence of right lower extremity swelling and redness and pain.  He was hospitalized for this until one week ago- discharged with clindamycin TID which he reports he is still taking.  Redness had improved until approx 3 days ago became red again.  approx 2 days ago noted blistered and draning wound to the back of right leg.  No fever/chills.  No diffuse systemic symptoms.  Pt states the leg remained swollen throughout the entire course but the redness had improved until  3 days ago.  There are no other associated systemic symptoms, there are no other alleviating or modifying factors.   Past Medical History  Diagnosis Date  . Sleep apnea    Past Surgical History  Procedure Laterality Date  . Nasal septum surgery    . Orif ulnar / radial shaft fracture     Family History  Problem Relation Age of Onset  . Diabetes      grandmother   Social History  Substance Use Topics  . Smoking status: Current Every Day Smoker -- 1.00 packs/day for 20 years    Types: Cigarettes  . Smokeless tobacco: None  . Alcohol Use: 0.0 oz/week    0 Standard drinks or equivalent per week    Review of Systems  ROS reviewed and all otherwise negative except for mentioned in HPI    Allergies  Review of patient's allergies indicates no known allergies.  Home Medications   Prior to Admission medications   Medication Sig Start Date End Date Taking? Authorizing Provider  acetaminophen (TYLENOL) 325 MG tablet Take 2 tablets (650 mg total) by mouth every 6 (six) hours as needed for mild pain (or Fever >/= 101). 06/29/15  Yes Alison Murray, MD  testosterone cypionate (DEPOTESTOTERONE CYPIONATE) 200 MG/ML injection  Inject 200 mg into the muscle once a week.  01/21/14  Yes Historical Provider, MD  cephALEXin (KEFLEX) 500 MG capsule Take 1 capsule (500 mg total) by mouth 2 (two) times daily. 07/09/15   Penny Pia, MD  DM-Phenylephrine-Acetaminophen (ALKA-SELTZER PLS SINUS & COUGH PO) Take 1 capsule by mouth every 8 (eight) hours as needed. For cold    Historical Provider, MD  sulfamethoxazole-trimethoprim (BACTRIM DS,SEPTRA DS) 800-160 MG tablet Take 2 tablets by mouth 2 (two) times daily. 07/09/15 07/16/15  Penny Pia, MD   BP 104/54 mmHg  Pulse 78  Temp(Src) 98.2 F (36.8 C) (Oral)  Resp 16  Ht  (1.778 m)  Wt 150.1 kg  BMI 47.48 kg/m2  SpO2 95%  Vitals reviewed Physical Exam  Physical Examination: General appearance - alert, well appearing, and in no distress Mental status - alert, oriented to person, place, and time Eyes - no conjunctival injection no scleral icterus Chest - clear to auscultation, no wheezes, rales or rhonchi, symmetric air entry Heart - normal rate, regular rhythm, normal S1, S2, no murmurs, rubs, clicks or gallops Neurological - alert, oriented, normal speech Musculoskeletal - no joint tenderness, deformity or swelling Extremities - peripheral pulses normal, no pedal edema, no clubbing or cyanosis Skin - normal coloration and turgor except right lower extremity with warmth and erythema, posterior right calf with draining sore   ED Course  Procedures (including critical care time) Labs  Review Labs Reviewed  CBC - Abnormal; Notable for the following:    Platelets 415 (*)    All other components within normal limits  BASIC METABOLIC PANEL - Abnormal; Notable for the following:    Calcium 8.6 (*)    All other components within normal limits  BASIC METABOLIC PANEL - Abnormal; Notable for the following:    Chloride 100 (*)    Creatinine, Ser 1.26 (*)    Calcium 8.6 (*)    All other components within normal limits  CULTURE, BLOOD (ROUTINE X 2)  CULTURE, BLOOD (ROUTINE  X 2)  WOUND CULTURE  FUNGUS CULTURE W SMEAR  HIV ANTIBODY (ROUTINE TESTING)  CBC    Imaging Review No results found. I have personally reviewed and evaluated these images and lab results as part of my medical decision-making.   EKG Interpretation None      MDM   Final diagnoses:  Cellulitis of right lower extremity    Pt presenting with c/o worsening redness and swelling of right LE - he was treated with abx inpatient for this and had been taking clindamycin, but symptoms recurred.   Patient is overall nontoxic and well hydrated in appearance.   Pt started on IV abx in the ED, plan for admission to medicine as he has failed outpatient therapy.  .   12:10 PM d/w [facility] for admission.  Pt to go to med/surg bed  Jerelyn Scott, MD 07/10/15 1754

## 2015-07-06 NOTE — ED Notes (Signed)
Pt reports he was in the hospital about a week for cellulitis of the right foot, recently dc'ed home on oral antibiotics but states his foot is not any better. Skin is now peeling and there is a blister on the back of his foot.

## 2015-07-06 NOTE — ED Notes (Addendum)
Admitting NP at bedside

## 2015-07-06 NOTE — Progress Notes (Signed)
Pt NIV mask is set up for the night humidity provdied and pressure is set as well as mask. Pt states that he will wear it once he ready for bed. Pt is up watching tv at this time. Pt is stable no distress or complications noted.

## 2015-07-06 NOTE — H&P (Signed)
[facility] History and Physical  TAAVI HOOSE QMV:[id number] DOB: [birth year] DOA: 07/06/2015  Referring physician: Emergency Department PCP: Salli Quarry, PA-C   CHIEF COMPLAINT:                   HPI: Richard Osborn is a 40 y.o. male hospitalized late January with sepsis secondary to RLE cellulitis after failing outpatient Doxycycline . According to admit note, outpatient doppler or right leg was negative for DVT.  Ortho was consulted, recommended continuation of IV antibiotics, elevation of extremity.   Patient spent five days in the hospital during which time he was transitioned to, and discharged home on 10 days of oral Clindamycin.  Patient states the massive swelling never improved but the erythema did. His pain had nearly resolved by the time of hospital discharge . A few days ago patient began having recurrent erythema and pain in his RLE. He noticed that some ulcerations had developed in his right calf. No fevers or chills. Right lower extremity pain is worse when weightbearing or in a dependent position   Patient has continued the clindamycin as prescribed, he has 3 days of therapy remaining. Prior to January patient had never had cellulitis, he denies any bug bites or trauma to his right lower extremity but does recall having athlete's foot involving his second right toe. There apparently was an open wound from irritation at that site.   ED COURSE:   Afebrile, vital signs are stable.       Labs:   Chem profile unremarkable  Medications  vancomycin (VANCOCIN) IVPB 1000 mg/200 mL premix (1,000 mg Intravenous New Bag/Given 07/06/15 1024)  morphine 4 MG/ML injection 4 mg (4 mg Intravenous Given 07/06/15 1019)  ondansetron (ZOFRAN) injection 4 mg (4 mg Intravenous Given 07/06/15 1017)    Review of Systems  Constitutional: Negative.   HENT: Negative.   Eyes: Negative.   Cardiovascular: Positive for leg swelling.  Gastrointestinal: Negative.   Genitourinary: Negative.     Musculoskeletal: Negative.   Skin: Negative.   Neurological: Negative.   Endo/Heme/Allergies: Negative.   Psychiatric/Behavioral: Negative.     Past Medical History  Diagnosis Date  . Sleep apnea    Past Surgical History  Procedure Laterality Date  . Nasal septum surgery    . Orif ulnar / radial shaft fracture      SOCIAL HISTORY:  reports that he has been smoking Cigarettes.  He has a 20 pack-year smoking history. He does not have any smokeless tobacco history on file. He reports that he drinks alcohol. He reports that he does not use illicit drugs. Lives:  At home     Assistive devices:   None needed for ambulation.   No Known Allergies  FMH :  Grandmother - diabetes. No known family history of vascular disease  Prior to Admission medications   Medication Sig Start Date End Date Taking? Authorizing Provider  acetaminophen (TYLENOL) 325 MG tablet Take 2 tablets (650 mg total) by mouth every 6 (six) hours as needed for mild pain (or Fever >/= 101). 06/29/15  Yes Alison Murray, MD  clindamycin (CLEOCIN) 300 MG capsule Take 1 capsule (300 mg total) by mouth 3 (three) times daily. 06/29/15  Yes Alison Murray, MD  testosterone cypionate (DEPOTESTOTERONE CYPIONATE) 200 MG/ML injection Inject 200 mg into the muscle once a week.  01/21/14  Yes Historical Provider, MD  DM-Phenylephrine-Acetaminophen (ALKA-SELTZER PLS SINUS & COUGH PO) Take 1 capsule by mouth every 8 (eight) hours as needed.  For cold    Historical Provider, MD   PHYSICAL EXAM: Filed Vitals:   07/06/15 1100 07/06/15 1115 07/06/15 1130 07/06/15 1200  BP: 110/58 110/65 107/53 113/60  Pulse: 73 75 76 79  Temp:      TempSrc:      Resp: SpO2: 97% 100% 100% 100%    Wt Readings from Last 3 Encounters:  06/24/15 149.687 kg (330 lb)  09/23/14 151.592 kg (334 lb 3.2 oz)  03/22/12 122.471 kg (270 lb)    General:  Pleasant white male. Appears calm and comfortable Eyes: PER, normal lids, irises &  conjunctiva ENT: grossly normal hearing, lips & tongue Neck: no LAD, no masses Cardiovascular: RRR, no murmurs. No LE edema.  Respiratory: Respirations even and unlabored. Normal respiratory effort. Lungs CTA bilaterally, no wheezes / rales .   Abdomen: soft, non-distended, non-tender, active bowel sounds. No obvious masses.  Skin: no rash seen on limited exam Musculoskeletal: grossly normal tone BUE/BLE Extremities: normal LLE. RLE with marked swelling and erythema  from knee down. Right calf with an approx 4" long ulcerated area covered with yellowish-grey exudate.  Psychiatric: grossly normal mood and affect, speech fluent and appropriate Neurologic: grossly non-focal.         LABS ON ADMISSION:    Basic Metabolic Panel:  Recent Labs Lab 07/06/15 1022  NA 140  K 4.0  CL 106  CO2 22  GLUCOSE 92  BUN 8  CREATININE 1.20  CALCIUM 8.6*    CBC:  Recent Labs Lab 07/06/15 1022  WBC 9.3  HGB 17.0  HCT 49.8  MCV 99.4  PLT 415*    CREATININE: 1.2 (07/06/15 1022) Estimated creatinine clearance - 120 mL/min  ASSESSMENT / PLAN    RLE cellulitis. Recent admission for same (after failing outpatient Doxycycline).  Now with worsening symptoms on PO clindamycin at home. At onset of symptoms last month patient had Doppler at urgent care which was negative for DVT -Admit to medical bed -Blood cultures pending -Culture wound to right calf -Obtain ABIs bilateral lower extremities -Will start Ancef per ID recommendations.  -Will ask ID evaluate.   OSA.  Will ask RT to arrange for CPAP   CONSULTANTS:   Infectious Disease  - Spoke with Dr. Anne Hahn who recommends high dose Ancef only  Code Status: Full code DVT Prophylaxis: Lovenox  Family Communication:  Patient alert, oriented and understands plan of care.  Disposition Plan: Discharge to home in 2-3 days   Time spent: 60 minutes Willette Cluster  NP [facility] Pager [phone]

## 2015-07-07 ENCOUNTER — Inpatient Hospital Stay (HOSPITAL_COMMUNITY): Payer: BLUE CROSS/BLUE SHIELD

## 2015-07-07 DIAGNOSIS — R609 Edema, unspecified: Secondary | ICD-10-CM

## 2015-07-07 LAB — BASIC METABOLIC PANEL
ANION GAP: 9 (ref 5–15)
BUN: 9 mg/dL (ref 6–20)
CHLORIDE: 100 mmol/L — AB (ref 101–111)
CO2: 28 mmol/L (ref 22–32)
CREATININE: 1.26 mg/dL — AB (ref 0.61–1.24)
Calcium: 8.6 mg/dL — ABNORMAL LOW (ref 8.9–10.3)
GFR calc Af Amer: >60 mL/min (ref >60)
GFR calc non Af Amer: >60 mL/min (ref >60)
Glucose, Bld: 90 mg/dL (ref 65–99)
POTASSIUM: 4.3 mmol/L (ref 3.5–5.1)
SODIUM: 137 mmol/L (ref 135–145)

## 2015-07-07 LAB — CBC
HEMATOCRIT: 48.9 % (ref 39.0–52.0)
HEMOGLOBIN: 16 g/dL (ref 13.0–17.0)
MCH: 32.7 pg (ref 26.0–34.0)
MCHC: 32.7 g/dL (ref 30.0–36.0)
MCV: 100 fL (ref 78.0–100.0)
Platelets: 389 10*3/uL (ref 150–400)
RBC: 4.89 MIL/uL (ref 4.22–5.81)
RDW: 13.5 % (ref 11.5–15.5)
WBC: 9.7 10*3/uL (ref 4.0–10.5)

## 2015-07-07 LAB — HIV ANTIBODY (ROUTINE TESTING W REFLEX): HIV SCREEN 4TH GENERATION: NONREACTIVE

## 2015-07-07 NOTE — Progress Notes (Signed)
Pt. seen earlier in shift, states,"able to turn/place on by self, humidity filled, made aware to notify if needed.

## 2015-07-07 NOTE — Progress Notes (Signed)
   KARTIK FERNANDO MVH:[id number] DOB: 07/28/1975 DOA: 07/06/2015 PCP: Salli Quarry, PA-C  Brief narrative: 40 y/o ? 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  Recent admission 1/25-->1/30 for LE cellulitis.  Started on CLindamycin and d/c on the same Represented with swelling redness and blebs and posterior calf oozing  Consultants:  ID  Procedures:  None   Antibiotics:  Vancomycin 2/6-stop  Ceftriaxone 2/6   Subjective   Fair Feels better Swelling of the LE on the seems improved No fever no chills Asking MD to fill FMLA   Objective    Interim History:    Objective: Filed Vitals:   07/06/15 1500 07/06/15 1710 07/06/15 2140 07/07/15 0618  BP: 101/53 105/45 115/67 107/53  Pulse: 74 77 75 69  Temp:  98.8 F (37.1 C) 98.4 F (36.9 C) 98.7 F (37.1 C)  TempSrc:  Oral Oral Oral  Resp: SpO2: 96% 97% 96% 98%    Intake/Output Summary (Last 24 hours) at 07/07/15 0748 Last data filed at 07/07/15 0620  Gross per 24 hour  Intake    290 ml  Output    500 ml  Net   -210 ml    Exam:  General: eomi ncat  Cardiovascular: s1 s2 no m/r/g Respiratory: clear no added soudn Abdomen:  Soft nt nd no rebound Neurointact  Data Reviewed: Basic Metabolic Panel:  Recent Labs Lab 07/06/15 1022 07/07/15 0520  NA 140 137  K 4.0 4.3  CL 106 100*  CO2 22 28  GLUCOSE 92 90  BUN 8 9  CREATININE 1.20 1.26*  CALCIUM 8.6* 8.6*   Liver Function Tests: No results for input(s): AST, ALT, ALKPHOS, BILITOT, PROT, ALBUMIN in the last 168 hours. No results for input(s): LIPASE, AMYLASE in the last 168 hours. No results for input(s): AMMONIA in the last 168 hours. CBC:  Recent Labs Lab 07/06/15 1022 07/07/15 0520  WBC 9.3 9.7  HGB 17.0 16.0  HCT 49.8 48.9  MCV 99.4 100.0  PLT 415* 389   Cardiac Enzymes: No results  for input(s): CKTOTAL, CKMB, CKMBINDEX, TROPONINI in the last 168 hours. BNP: Invalid input(s): POCBNP CBG: No results for input(s): GLUCAP in the last 168 hours.  No results found for this or any previous visit (from the past 240 hour(s)).   Studies:              All Imaging reviewed and is as per above notation   Scheduled Meds: .  ceFAZolin (ANCEF) IV  2 g Intravenous 3 times per day  . enoxaparin (LOVENOX) injection  75 mg Subcutaneous Q24H   Continuous Infusions:    Assessment/Plan:  1. Cellulitis-purulent-transitioned off of Clinda-had been prior treated with Doxy.   Continue Ancef IV for now.  Would probably cover for MRSA primarily given Furuncles.  Would useMupirocin and chlorhexidine washes.  Superficial wound cult probably not very helpful.  Might transition to Bactrim DS 2 tab BID given BMI>40.  His ABI's were normal and he has normal pulses-no concern for vascular compromise.  CBC in am 2. OSA-continue CPAP 3. Morbid obestiy-needs OP counselling re: Bethann Goo, MD  [facility] Pager [phone] 07/07/2015, 7:48 AM    LOS: 1 day

## 2015-07-07 NOTE — Progress Notes (Signed)
VASCULAR LAB PRELIMINARY  ARTERIAL  ABI completed:  ABIs and pedal waveforms normal.    RIGHT    LEFT    PRESSURE WAVEFORM  PRESSURE WAVEFORM  BRACHIAL 143 Triphasic  BRACHIAL 142 Triphasic   DP 166 Triphasic  DP 153 Triphasic  AT   AT    PT 155 Triphasic  PT 170 Triphasic   PER   PER    GREAT TOE  NA GREAT TOE  NA    RIGHT LEFT  ABI 1.16 1.19     Nicholi Ghuman, RVT 07/07/2015, 9:27 AM

## 2015-07-07 NOTE — Care Management Note (Signed)
Case Management Note  Patient Details  Name: Richard Osborn MRN: [id number] Date of Birth: Jul 25, 1975  Subjective/Objective:                    Action/Plan:  Initial UR completed.  Expected Discharge Date:                  Expected Discharge Plan:  Home/Self Care  In-House Referral:     Discharge planning Services     Post Acute Care Choice:    Choice offered to:     DME Arranged:    DME Agency:     HH Arranged:    HH Agency:     Status of Service:  In process, will continue to follow  Medicare Important Message Given:    Date Medicare IM Given:    Medicare IM give by:    Date Additional Medicare IM Given:    Additional Medicare Important Message give by:     If discussed at Long Length of Stay Meetings, dates discussed:    Additional Comments:  Kingsley Plan, RN 07/07/2015, 1:11 PM

## 2015-07-08 NOTE — Progress Notes (Signed)
Richard Osborn [id number]:[id number] DOB: 1975-11-01 DOA: 07/06/2015 PCP: Salli Quarry, PA-C  Brief narrative: 40 y/o ? 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  Recent admission 1/25-->1/30 for LE cellulitis.  Started on CLindamycin and d/c on the same Represented with swelling redness and blebs and posterior calf oozing Placed on IV vancomycin and ceftriaxone on admission and discussed with ID who recommended ceftriaxone monotherapy  Consultants:  ID  Procedures:  None   Antibiotics:  Vancomycin 2/6-stop  Ceftriaxone 2/6   Subjective   Fair Has some pain in the RLE erythema seems to have resolved No fever no chills No nausea no vomiting no retained hardware    Objective    Interim History:    Objective: Filed Vitals:   07/07/15 0618 07/07/15 1436 07/07/15 2125 07/08/15 0551  BP: 107/53 112/71 125/65 119/65  Pulse: 69 79 82 79  Temp: 98.7 F (37.1 C) 98.4 F (36.9 C) 99.2 F (37.3 C) 98.3 F (36.8 C)  TempSrc: Oral Oral Oral Oral  Resp: Weight:   151.7 kg (334 lb 7 oz) 150.9 kg (332 lb 10.8 oz)  SpO2: 98% 98% 98% 98%    Intake/Output Summary (Last 24 hours) at 07/08/15 0758 Last data filed at 07/08/15 0430  Gross per 24 hour  Intake    540 ml  Output    500 ml  Net     40 ml    Exam:  General: eomi ncat  Cardiovascular: s1 s2 no m/r/g Respiratory: clear no added soudn Abdomen:  Soft nt nd no rebound Neurointact  Data Reviewed: Basic Metabolic Panel:  Recent Labs Lab 07/06/15 1022 07/07/15 0520  NA 140 137  K 4.0 4.3  CL 106 100*  CO2 22 28  GLUCOSE 92 90  BUN 8 9  CREATININE 1.20 1.26*  CALCIUM 8.6* 8.6*   Liver Function Tests: No results for input(s): AST, ALT, ALKPHOS, BILITOT, PROT, ALBUMIN in the last 168 hours. No results for input(s): LIPASE, AMYLASE in the last 168 hours. No results  for input(s): AMMONIA in the last 168 hours. CBC:  Recent Labs Lab 07/06/15 1022 07/07/15 0520  WBC 9.3 9.7  HGB 17.0 16.0  HCT 49.8 48.9  MCV 99.4 100.0  PLT 415* 389   Cardiac Enzymes: No results for input(s): CKTOTAL, CKMB, CKMBINDEX, TROPONINI in the last 168 hours. BNP: Invalid input(s): POCBNP CBG: No results for input(s): GLUCAP in the last 168 hours.  Recent Results (from the past 240 hour(s))  Blood culture (routine x 2)     Status: None (Preliminary result)   Collection Time: 07/06/15  9:51 AM  Result Value Ref Range Status   Specimen Description BLOOD LEFT ANTECUBITAL  Final   Special Requests BOTTLES DRAWN AEROBIC AND ANAEROBIC 5CC  Final   Culture NO GROWTH 1 DAY  Final   Report Status PENDING  Incomplete  Blood culture (routine x 2)     Status: None (Preliminary result)   Collection Time: 07/06/15 10:16 AM  Result Value Ref Range Status   Specimen Description BLOOD LEFT ANTECUBITAL  Final   Special Requests   Final    BOTTLES DRAWN AEROBIC AND ANAEROBIC 5CC ANA 6CC AER   Culture NO GROWTH 1 DAY  Final   Report Status PENDING  Incomplete  Wound culture     Status: None (Preliminary result)   Collection Time:  07/06/15  2:34 PM  Result Value Ref Range Status   Specimen Description WOUND RIGHT LEG  Final   Special Requests Normal  Final   Gram Stain   Final    NO WBC SEEN NO SQUAMOUS EPITHELIAL CELLS SEEN NO ORGANISMS SEEN Performed at [facility]    Culture   Final    Culture reincubated for better growth Performed at [facility]    Report Status PENDING  Incomplete  Fungus Culture with Smear     Status: None (Preliminary result)   Collection Time: 07/06/15  2:34 PM  Result Value Ref Range Status   Specimen Description WOUND RIGHT LEG  Final   Special Requests NONE  Final   Fungal Smear   Final    NO YEAST OR FUNGAL ELEMENTS SEEN Performed at [facility]    Culture   Final    CULTURE IN PROGRESS FOR FOUR  WEEKS Performed at [facility]    Report Status PENDING  Incomplete     Studies:              All Imaging reviewed and is as per above notation   Scheduled Meds: .  ceFAZolin (ANCEF) IV  2 g Intravenous 3 times per day  . enoxaparin (LOVENOX) injection  75 mg Subcutaneous Q24H   Continuous Infusions:    Assessment/Plan:  1. Cellulitis-non-purulent-transitioned off of Clinda-had been prior treated with Doxy.   Continue Ancef IV for now.    Superficial wound cult probably not very helpful.  I did discuss with ID MD Dr. Drue Second- transition to Bactrim DS 2 tab BID in am given BMI>40, also Keflex 500 bid on d/c to cover for Streptococcus-whioh can be done in am.  His ABI's were normal and he has normal pulses-no concern for vascular compromise.  2. OSA-continue CPAP 3. Body mass index is 47.73 kg/(m^2).-needs OP counselling re: Paula Compton, MD  [facility] Pager [phone] 07/08/2015, 7:58 AM    LOS: 2 days

## 2015-07-09 DIAGNOSIS — L03115 Cellulitis of right lower limb: Principal | ICD-10-CM

## 2015-07-09 LAB — WOUND CULTURE
Culture: NORMAL
Gram Stain: NONE SEEN
Special Requests: NORMAL

## 2015-07-09 MED ORDER — CEPHALEXIN 500 MG PO CAPS: 500.0000 mg | ORAL_CAPSULE | Freq: Two times a day (BID) | ORAL | Status: DC

## 2015-07-09 MED ORDER — SULFAMETHOXAZOLE-TRIMETHOPRIM 800-160 MG PO TABS: 2.0000 | ORAL_TABLET | Freq: Two times a day (BID) | ORAL | Status: AC

## 2015-07-09 NOTE — Progress Notes (Signed)
Per Cena Benton, MD, Discharge order entered incorrectly, off campus and unable to change it for RN.  RN instructed to print AVS and send patient home with prescriptions.  IV removed after last IV abx administered.  AVS given to patient, understanding demonstrated.  Patient accidentally left FMLA and work paperwork, he is aware and stated somebody will come back and pick it up.  Transportation arranged with his girlfriend.

## 2015-07-09 NOTE — Discharge Summary (Signed)
Physician Discharge Summary  Richard Osborn:[id number] DOB: [birth year] DOA: 07/06/2015  PCP: Salli Quarry, PA-C  Admit date: 07/06/2015 Discharge date: 07/09/2015  Time spent: > 35 minutes  Recommendations for Outpatient Follow-up:  1. Monitor serum creatinine 2. Reassess leg and decide whether or not to prolong antibiotic regimen   Discharge Diagnoses:  Active Problems:   OSA (obstructive sleep apnea)   Cellulitis of right leg   Hypotension   CKD (chronic kidney disease), stage II   Discharge Condition: stable  Diet recommendation: Regular diet  Filed Weights   07/07/15 2125 07/08/15 0551 07/09/15 0735  Weight: 151.7 kg (334 lb 7 oz) 150.9 kg (332 lb 10.8 oz) 150.1 kg (330 lb 14.6 oz)    History of present illness:  40 y/o presenting with reported fevers and RLE cellulitis  Hospital Course:  RLE cellulitis - was on clindamycin but leg condition did not improve - ID recommended bactrim and keflex on d/c - will treat for 7 more days with abx to complete a 10 day treatment course.  Procedures:  None  Consultations:  None  Discharge Exam: Filed Vitals:   07/09/15 0547 07/09/15 0958  BP: 128/74 104/54  Pulse: 77 78  Temp: 98.4 F (36.9 C) 98.2 F (36.8 C)  Resp: 18 16    General: Pt in nad, alert and awake Cardiovascular: rrr, no mrg Respiratory: cta bl, no wheezes Skin: marked areas where cellulitis was has shown regression of erythema   Discharge Instructions   Discharge Instructions    Call MD for:  difficulty breathing, headache or visual disturbances    Complete by:  As directed      Call MD for:  extreme fatigue    Complete by:  As directed      Call MD for:  redness, tenderness, or signs of infection (pain, swelling, redness, odor or green/yellow discharge around incision site)    Complete by:  As directed      Call MD for:  temperature >100.4    Complete by:  As directed      Diet - low sodium heart healthy    Complete by:  As directed       Discharge instructions    Complete by:  As directed   Please follow up with your primary care physician in 1-2 weeks or sooner should any new concerns arise.     Increase activity slowly    Complete by:  As directed           Current Discharge Medication List    START taking these medications   Details  cephALEXin (KEFLEX) 500 MG capsule Take 1 capsule (500 mg total) by mouth 2 (two) times daily. Qty: 14 capsule, Refills: 0    sulfamethoxazole-trimethoprim (BACTRIM DS,SEPTRA DS) 800-160 MG tablet Take 2 tablets by mouth 2 (two) times daily. Qty: 28 tablet, Refills: 0      CONTINUE these medications which have NOT CHANGED   Details  acetaminophen (TYLENOL) 325 MG tablet Take 2 tablets (650 mg total) by mouth every 6 (six) hours as needed for mild pain (or Fever >/= 101). Qty: 30 tablet, Refills: 0    testosterone cypionate (DEPOTESTOTERONE CYPIONATE) 200 MG/ML injection Inject 200 mg into the muscle once a week.  Refills: 1    DM-Phenylephrine-Acetaminophen (ALKA-SELTZER PLS SINUS & COUGH PO) Take 1 capsule by mouth every 8 (eight) hours as needed. For cold      STOP taking these medications     clindamycin (CLEOCIN) 300  MG capsule        No Known Allergies    The results of significant diagnostics from this hospitalization (including imaging, microbiology, ancillary and laboratory) are listed below for reference.    Significant Diagnostic Studies: No results found.  Microbiology: Recent Results (from the past 240 hour(s))  Blood culture (routine x 2)     Status: None (Preliminary result)   Collection Time: 07/06/15  9:51 AM  Result Value Ref Range Status   Specimen Description BLOOD LEFT ANTECUBITAL  Final   Special Requests BOTTLES DRAWN AEROBIC AND ANAEROBIC 5CC  Final   Culture NO GROWTH 2 DAYS  Final   Report Status PENDING  Incomplete  Blood culture (routine x 2)     Status: None (Preliminary result)   Collection Time: 07/06/15 10:16 AM  Result  Value Ref Range Status   Specimen Description BLOOD LEFT ANTECUBITAL  Final   Special Requests   Final    BOTTLES DRAWN AEROBIC AND ANAEROBIC 5CC ANA 6CC AER   Culture NO GROWTH 2 DAYS  Final   Report Status PENDING  Incomplete  Wound culture     Status: None   Collection Time: 07/06/15  2:34 PM  Result Value Ref Range Status   Specimen Description WOUND RIGHT LEG  Final   Special Requests Normal  Final   Gram Stain   Final    NO WBC SEEN NO SQUAMOUS EPITHELIAL CELLS SEEN NO ORGANISMS SEEN Performed at [facility]    Culture   Final    NORMAL SKIN FLORA Note: NO STAPHYLOCOCCUS AUREUS ISOLATED NO GROUP A STREP (S.PYOGENES) ISOLATED Performed at [facility]    Report Status 07/09/2015 FINAL  Final  Fungus Culture with Smear     Status: None (Preliminary result)   Collection Time: 07/06/15  2:34 PM  Result Value Ref Range Status   Specimen Description WOUND RIGHT LEG  Final   Special Requests NONE  Final   Fungal Smear   Final    NO YEAST OR FUNGAL ELEMENTS SEEN Performed at [facility]    Culture   Final    CULTURE IN PROGRESS FOR FOUR WEEKS Performed at [facility]    Report Status PENDING  Incomplete     Labs: Basic Metabolic Panel:  Recent Labs Lab 07/06/15 1022 07/07/15 0520  NA 140 137  K 4.0 4.3  CL 106 100*  CO2 22 28  GLUCOSE 92 90  BUN 8 9  CREATININE 1.20 1.26*  CALCIUM 8.6* 8.6*   Liver Function Tests: No results for input(s): AST, ALT, ALKPHOS, BILITOT, PROT, ALBUMIN in the last 168 hours. No results for input(s): LIPASE, AMYLASE in the last 168 hours. No results for input(s): AMMONIA in the last 168 hours. CBC:  Recent Labs Lab 07/06/15 1022 07/07/15 0520  WBC 9.3 9.7  HGB 17.0 16.0  HCT 49.8 48.9  MCV 99.4 100.0  PLT 415* 389   Cardiac Enzymes: No results for input(s): CKTOTAL, CKMB, CKMBINDEX, TROPONINI in the last 168 hours. BNP: BNP (last 3 results) No results for input(s): BNP in the last  8760 hours.  ProBNP (last 3 results) No results for input(s): PROBNP in the last 8760 hours.  CBG: No results for input(s): GLUCAP in the last 168 hours.     Signed:  Penny Pia MD.  [facility] 07/09/2015, 12:14 PM

## 2015-07-11 LAB — CULTURE, BLOOD (ROUTINE X 2)
Culture: NO GROWTH
Culture: NO GROWTH

## 2015-08-01 LAB — FUNGUS CULTURE W SMEAR: FUNGAL SMEAR: NONE SEEN

## 2015-11-30 ENCOUNTER — Telehealth: Payer: Self-pay | Admitting: Pulmonary Disease

## 2015-11-30 NOTE — Telephone Encounter (Signed)
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  Just needs copy of download and OV notes to submit with DOT papers.  Scheduled patient to see Richard Osborn on 12/14/15. Patient aware of appointment. Nothing further needed.

## 2015-12-14 ENCOUNTER — Ambulatory Visit: Payer: BLUE CROSS/BLUE SHIELD | Admitting: Pulmonary Disease

## 2016-01-04 ENCOUNTER — Ambulatory Visit (INDEPENDENT_AMBULATORY_CARE_PROVIDER_SITE_OTHER): Payer: BLUE CROSS/BLUE SHIELD | Admitting: Adult Health

## 2016-01-04 ENCOUNTER — Encounter: Payer: Self-pay | Admitting: Adult Health

## 2016-01-04 DIAGNOSIS — G4733 Obstructive sleep apnea (adult) (pediatric): Secondary | ICD-10-CM

## 2016-01-04 NOTE — Progress Notes (Signed)
Subjective:    Patient ID: Richard Osborn, male    DOB: 11/22/1975, 40 y.o.   MRN: [id number]  HPI 40 year old male followed for obstructive sleep apnea on nocturnal C Pap Patient has been diagnosed with sleep apnea in 2003. Former patient of Dr. Shelle Ironlance   01/04/2016 follow-up obstructive sleep apnea Patient presents for one-year follow-up. Patient is being followed for his obstructive sleep apnea. He says he's been doing very well with his C Pap at bedtime. He feels rested . Uses CPAP on average 7-8 hours nightly, mask fit okay. Needs new supplies.  Download shows excellent compliance with average usage at 6 hours 42 minutes. He is on a set pressure of 11 cm of H2O. AHI 4.5. Minimum leaks.. We discussed weight loss and smoking cessation. He works for the city , driving the city bus.   Past Medical History:  Diagnosis Date  . Sleep apnea    Current Outpatient Prescriptions on File Prior to Visit  Medication Sig Dispense Refill  . acetaminophen (TYLENOL) 325 MG tablet Take 2 tablets (650 mg total) by mouth every 6 (six) hours as needed for mild pain (or Fever >/= 101). (Patient not taking: Reported on 01/04/2016) 30 tablet 0  . cephALEXin (KEFLEX) 500 MG capsule Take 1 capsule (500 mg total) by mouth 2 (two) times daily. (Patient not taking: Reported on 01/04/2016) 14 capsule 0  . DM-Phenylephrine-Acetaminophen (ALKA-SELTZER PLS SINUS & COUGH PO) Take 1 capsule by mouth every 8 (eight) hours as needed. For cold    . testosterone cypionate (DEPOTESTOTERONE CYPIONATE) 200 MG/ML injection Inject 200 mg into the muscle once a week.   1   No current facility-administered medications on file prior to visit.      Review of Systems ,Constitutional:   No  weight loss, night sweats,  Fevers, chills, fatigue, or  lassitude.  HEENT:   No headaches,  Difficulty swallowing,  Tooth/dental problems, or  Sore throat,                No sneezing, itching, ear ache, nasal congestion, post nasal drip,    CV:  No chest pain,  Orthopnea, PND, swelling in lower extremities, anasarca, dizziness, palpitations, syncope.   GI  No heartburn, indigestion, abdominal pain, nausea, vomiting, diarrhea, change in bowel habits, loss of appetite, bloody stools.   Resp: No shortness of breath with exertion or at rest.  No excess mucus, no productive cough,  No non-productive cough,  No coughing up of blood.  No change in color of mucus.  No wheezing.  No chest wall deformity  Skin: no rash or lesions.  GU: no dysuria, change in color of urine, no urgency or frequency.  No flank pain, no hematuria   MS:  No joint pain or swelling.  No decreased range of motion.  No back pain.  Psych:  No change in mood or affect. No depression or anxiety.  No memory loss.         Objective:   Physical Exam  Vitals:   01/04/16 1028  BP: 128/80  Pulse: 73  SpO2: 97%  Weight: (!) 326 lb (147.9 kg)  Height: 5\' 10"  (1.778 m)  Body mass index is 46.78 kg/m.   GEN: A/Ox3; pleasant , NAD, well nourished    HEENT:  [place]/AT,  EACs-clear, TMs-wnl, NOSE-clear, THROAT-clear, no lesions, no postnasal drip or exudate noted. Class 3 MP airway   NECK:  Supple w/ fair ROM; no JVD; normal carotid impulses w/o bruits; no  thyromegaly or nodules palpated; no lymphadenopathy.    RESP  Clear  P & A; w/o, wheezes/ rales/ or rhonchi. no accessory muscle use, no dullness to percussion  CARD:  RRR, no m/r/g  , no peripheral edema, pulses intact, no cyanosis or clubbing.  GI:   Soft & nt; nml bowel sounds; no organomegaly or masses detected.   Musco: Warm bil, no deformities or joint swelling noted.   Neuro: alert, no focal deficits noted.    Skin: Warm, no lesions or rashes  Cuahutemoc Attar NP-C  [facility]  01/04/2016

## 2016-01-04 NOTE — Patient Instructions (Addendum)
Keep up the great work Wear C Pap every night. Work on weight loss. Do not drive a sleepy Follow up Dr. Christene Slatese Dios in 1 year and As needed

## 2016-01-04 NOTE — Addendum Note (Signed)
Addended by: Karalee HeightOX, Livie Vanderhoof P on: 01/04/2016 12:10 PM   Modules accepted: Orders

## 2016-01-04 NOTE — Assessment & Plan Note (Signed)
Well controlled Sleep apnea   Plan  Keep up the great work Wear C Pap every night. Work on weight loss. Do not drive a sleepy Follow up Dr. Christene Slatese Dios in 1 year and As needed

## 2016-01-11 ENCOUNTER — Encounter: Payer: Self-pay | Admitting: Adult Health

## 2016-11-21 ENCOUNTER — Ambulatory Visit (HOSPITAL_COMMUNITY)
Admission: RE | Admit: 2016-11-21 | Discharge: 2016-11-21 | Disposition: A | Discharge status: Home / Self Care | Payer: 59 | Attending: Psychiatry | Admitting: Psychiatry

## 2016-11-22 ENCOUNTER — Encounter (HOSPITAL_COMMUNITY): Payer: Self-pay

## 2016-11-22 ENCOUNTER — Emergency Department (HOSPITAL_COMMUNITY)
Admission: EM | Admit: 2016-11-22 | Discharge: 2016-11-22 | Disposition: A | Discharge status: Home / Self Care | Payer: 59 | Attending: Emergency Medicine | Admitting: Emergency Medicine

## 2016-11-22 ENCOUNTER — Ambulatory Visit (HOSPITAL_COMMUNITY)
Admission: RE | Admit: 2016-11-22 | Discharge: 2016-11-22 | Disposition: A | Discharge status: Home / Self Care | Payer: 59 | Source: Home / Self Care | Attending: Psychiatry | Admitting: Psychiatry

## 2016-11-22 DIAGNOSIS — F1020 Alcohol dependence, uncomplicated: Secondary | ICD-10-CM | POA: Diagnosis not present

## 2016-11-22 DIAGNOSIS — N182 Chronic kidney disease, stage 2 (mild): Secondary | ICD-10-CM | POA: Insufficient documentation

## 2016-11-22 DIAGNOSIS — F1014 Alcohol abuse with alcohol-induced mood disorder: Secondary | ICD-10-CM

## 2016-11-22 DIAGNOSIS — F329 Major depressive disorder, single episode, unspecified: Secondary | ICD-10-CM | POA: Diagnosis not present

## 2016-11-22 DIAGNOSIS — Z1389 Encounter for screening for other disorder: Secondary | ICD-10-CM | POA: Insufficient documentation

## 2016-11-22 DIAGNOSIS — F17210 Nicotine dependence, cigarettes, uncomplicated: Secondary | ICD-10-CM | POA: Insufficient documentation

## 2016-11-22 DIAGNOSIS — F32A Depression, unspecified: Secondary | ICD-10-CM

## 2016-11-22 DIAGNOSIS — F10231 Alcohol dependence with withdrawal delirium: Secondary | ICD-10-CM | POA: Diagnosis present

## 2016-11-22 DIAGNOSIS — F1010 Alcohol abuse, uncomplicated: Secondary | ICD-10-CM

## 2016-11-22 LAB — RAPID URINE DRUG SCREEN, HOSP PERFORMED
AMPHETAMINES: NOT DETECTED
BENZODIAZEPINES: NOT DETECTED
Barbiturates: NOT DETECTED
Cocaine: NOT DETECTED
Opiates: NOT DETECTED
TETRAHYDROCANNABINOL: NOT DETECTED

## 2016-11-22 LAB — CBC
HEMATOCRIT: 49.6 % (ref 39.0–52.0)
Hemoglobin: 17.3 g/dL — ABNORMAL HIGH (ref 13.0–17.0)
MCH: 38.7 pg — ABNORMAL HIGH (ref 26.0–34.0)
MCHC: 34.9 g/dL (ref 30.0–36.0)
MCV: 111 fL — AB (ref 78.0–100.0)
Platelets: 160 10*3/uL (ref 150–400)
RBC: 4.47 MIL/uL (ref 4.22–5.81)
RDW: 13.8 % (ref 11.5–15.5)
WBC: 4.7 10*3/uL (ref 4.0–10.5)

## 2016-11-22 LAB — URINALYSIS, ROUTINE W REFLEX MICROSCOPIC
Glucose, UA: NEGATIVE mg/dL
HGB URINE DIPSTICK: NEGATIVE
KETONES UR: 5 mg/dL — AB
Leukocytes, UA: NEGATIVE
Nitrite: NEGATIVE
PROTEIN: NEGATIVE mg/dL
Specific Gravity, Urine: 1.024 (ref 1.005–1.030)
pH: 5 (ref 5.0–8.0)

## 2016-11-22 LAB — COMPREHENSIVE METABOLIC PANEL
ALBUMIN: 3.3 g/dL — AB (ref 3.5–5.0)
ALT: 40 U/L (ref 17–63)
ANION GAP: 10 (ref 5–15)
AST: 34 U/L (ref 15–41)
Alkaline Phosphatase: 65 U/L (ref 38–126)
BUN: 7 mg/dL (ref 6–20)
CHLORIDE: 109 mmol/L (ref 101–111)
CO2: 24 mmol/L (ref 22–32)
Calcium: 8.3 mg/dL — ABNORMAL LOW (ref 8.9–10.3)
Creatinine, Ser: 0.83 mg/dL (ref 0.61–1.24)
GFR calc non Af Amer: >60 mL/min (ref >60)
GLUCOSE: 87 mg/dL (ref 65–99)
Potassium: 3.4 mmol/L — ABNORMAL LOW (ref 3.5–5.1)
SODIUM: 143 mmol/L (ref 135–145)
Total Bilirubin: 2.1 mg/dL — ABNORMAL HIGH (ref 0.3–1.2)
Total Protein: 6.9 g/dL (ref 6.5–8.1)

## 2016-11-22 LAB — ETHANOL: Alcohol, Ethyl (B): 138 mg/dL — ABNORMAL HIGH (ref <5)

## 2016-11-22 MED ORDER — THIAMINE HCL 100 MG/ML IJ SOLN: 100.0000 mg | Freq: Every day | INTRAMUSCULAR | Status: DC

## 2016-11-22 MED ORDER — LORAZEPAM 2 MG/ML IJ SOLN: 0.0000 mg | Freq: Four times a day (QID) | INTRAMUSCULAR | Status: DC

## 2016-11-22 MED ORDER — LORAZEPAM 1 MG PO TABS
0.0000 mg | ORAL_TABLET | Freq: Four times a day (QID) | ORAL | Status: DC
Start: 2016-11-22 — End: 2016-11-22

## 2016-11-22 MED ORDER — VITAMIN B-1 100 MG PO TABS
100.0000 mg | ORAL_TABLET | Freq: Every day | ORAL | Status: DC
  Filled 2016-11-22: qty 1

## 2016-11-22 MED ORDER — LORAZEPAM 1 MG PO TABS
0.0000 mg | ORAL_TABLET | Freq: Two times a day (BID) | ORAL | Status: DC
Start: 2016-11-24 — End: 2016-11-22

## 2016-11-22 MED ORDER — LORAZEPAM 2 MG/ML IJ SOLN: 0.0000 mg | Freq: Two times a day (BID) | INTRAMUSCULAR | Status: DC

## 2016-11-22 NOTE — BH Assessment (Addendum)
Tele Assessment Note   Richard Osborn is an 41 y.o. male who came voluntarily to [facility] as a Walk-In pt c/o alcohol abuse/intoxication and depression. Pt denies SI, HI SHI and AVH. Pt has no hx of psychiatric treatment or suicide attempts however, pt reports a low grade depression "for a long time." Pt sts his depression increased dramatically in March 2018 when he lost his job and later took a new job that he sts is not nearly as good. Pt sts that as a result he now has financial problems. Also, about the same time, pt reports that he had a romantic breakup which added to his depression. Pt's symptoms of depression including sadness, fatigue, decreased self esteem, tearfulness,  lack of motivation for activities and pleasure, irritability, negative outlook, difficulty thinking & concentrating, feeling helpless and hopeless, sleep and eating disturbances. Pt sts he does not see a psychiatrist or a threrPt was dressed in scrubs appropriate, modest street clothes and sitting on their hospital bed. Pt was alert, cooperative and pleasant. Pt kept good eye contact, spoke in a clear tone and at a normal pace. Pt moved in a normal manner when moving. Pt's thought process was coherent and relevant and judgement was impaired.  No indication of delusional thinking or response to internal stimuli. Pt's mood was depressed anxious and their blunted affect was congruent.  Pt was oriented x 4, to person, place, time and situation. apist currently. Pt has no hx of psychiatric admissions or OP therapy.   Pt sts he now lives alone. 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 Pt sts he fomerly worked for the [employer] in the same position. Pt sts his mother and his maternal aunt both drank alcohol in excess although pt sts his mother has stopped drinking now. Pt denies any hx of suicide in his family or any mental health issues. Pt denies any hx of physical or verbal aggression toward others  and denies any arrests or issues with LE beyond 2 DWIs when he was 14 and 33 yo. Pt sts he owns a gun but that a friend is securing it for him. Pt sts he sleeps about 6 hours each night and uses a CPAP to treat his obstructive sleep apnea. Pt sts he has had decreased appetite since March when his depression and his drinking increased. Pt sts he has lost 40 lbs. since that time. Pt denies physical, verbal and sexual abuse. Pt denies anxiety issues. Pt sts he drinks between 1/2 a fifth and a full fifth of vodka daily. Pt also smokes about 1 pack of cigarettes daily. Pt sts he started both at 41 yo. Pt sts he has been sober for a period of 18 months in the past.   Pt was appropriate, modest street clothes and appeared to smell of alcohol. Pt was alert, cooperative and pleasant. Pt kept good eye contact, spoke in a clear tone and at a normal pace. Pt moved in a normal manner when moving. Pt's thought process was coherent and relevant and judgement was impaired.  No indication of delusional thinking or response to internal stimuli. Pt's mood was stated as depressed but not anxious and his blunted affect was congruent.  Pt was oriented x 4, to person, place, time and situation.   Diagnosis: MDD, Recurrent, Severe without psychotic features; Alcohol Use D/O, Severe  Past Medical History:  Past Medical History:  Diagnosis Date  . Sleep apnea  Past Surgical History:  Procedure Laterality Date  . NASAL SEPTUM SURGERY    . ORIF ULNAR / RADIAL SHAFT FRACTURE      Family History:  Family History  Problem Relation Age of Onset  . Diabetes Unknown        grandmother    Social History:  reports that he has been smoking Cigarettes.  He has a 20.00 pack-year smoking history. He does not have any smokeless tobacco history on file. He reports that he drinks alcohol. He reports that he does not use drugs.  Additional Social History:  Alcohol / Drug Use Prescriptions: NO PSYCH MEDS CURRENTLY History of  alcohol / drug use?: Yes Longest period of sobriety (when/how long): 18 MONTHS Substance #1 Name of Substance 1: ALCOHOL 1 - Age of First Use: 16 1 - Amount (size/oz): VODKA- 1/2 A FIFTH TO A FIFTH 1 - Frequency: DAILY 1 - Duration: ONGOING 1 - Last Use / Amount: 11/21/16 Substance #2 Name of Substance 2: NICOTINE/CIGARETTES 2 - Age of First Use: 16 2 - Amount (size/oz): 1 PACK 2 - Frequency: DAILY 2 - Duration: ONGOING 2 - Last Use / Amount: 11/22/16  CIWA:   COWS:    PATIENT STRENGTHS: (choose at least two) Ability for insight Average or above average intelligence Capable of independent living Communication skills Supportive family/friends  Allergies: No Known Allergies  Home Medications:  (Not in a hospital admission)  OB/GYN Status:  No LMP for male patient.  General Assessment Data [facility] Assessment: In system Is this a Tele or Face-to-Face Assessment?: Face-to-Face Is this an Initial Assessment or a Re-assessment for this encounter?: Initial Assessment Marital status: Single Living Arrangements: Alone Can pt return to current living arrangement?: Yes Admission Status: Voluntary Is patient capable of signing voluntary admission?: Yes Referral Source: Self/Family/Friend (EAP REFERRAL) Insurance type:  Herbalist(BCBS)  Medical Screening Exam [facility] Walk[facility] ONLY) Medical Exam completed: Yes ([facility]SE BY SPENCER SIMON, PA)  Crisis Care Plan Living Arrangements: Alone Name of Psychiatrist:  (NONE) Name of Therapist:  (NONE)  Education Status Is patient currently in school?: No Highest grade of school patient has completed:  (GED)  Risk to self with the past 6 months Suicidal Ideation: No (DENIES) Has patient been a risk to self within the past 6 months prior to admission? : No Suicidal Intent: No Has patient had any suicidal intent within the past 6 months prior to admission? : No Is patient at risk for suicide?: No Suicidal  Plan?: No Has patient had any suicidal plan within the past 6 months prior to admission? : No Access to Means: Yes Specify Access to Suicidal Means:  (STS HAS ACCESS TO GUNS BUT WILL HAVE FRIEND SECURE) What has been your use of drugs/alcohol within the last 12 months?:  (DAILY USE) Previous Attempts/Gestures: No (DENIES; NO MH TX HX) How many times?:  (0) Other Self Harm Risks:  (NONE REPORTED) Triggers for Past Attempts: None known Intentional Self Injurious Behavior: None Family Suicide History: No Recent stressful life event(s): Job Loss, Other (Comment) (GAINED NEW JOB NOT AS GOOD; ROMANTIC BREAKUP IN MARCH) Persecutory voices/beliefs?: No Depression: Yes Depression Symptoms: Insomnia, Fatigue, Guilt, Loss of interest in usual pleasures, Feeling worthless/self pity, Feeling angry/irritable Substance abuse history and/or treatment for substance abuse?: Yes Suicide prevention information given to non-admitted patients: Not applicable  Risk to Others within the past 6 months Homicidal Ideation: No (DENIES) Does patient have any lifetime risk of violence toward others beyond  the six months prior to admission? : No (DENIES) Thoughts of Harm to Others: No (DENIES) Current Homicidal Intent: No Current Homicidal Plan: No Access to Homicidal Means: Yes (STS WILL BE SECURED) Describe Access to Homicidal Means:  (STS HAS GUN BUT FRIEND IS SECURING) Identified Victim:  (NONE REPORTED) History of harm to others?: No Assessment of Violence: None Noted Violent Behavior Description:  (NA) Does patient have access to weapons?: Yes (Comment) Criminal Charges Pending?: No (DENIES) Does patient have a court date: No Is patient on probation?: No  Psychosis Hallucinations: None noted (DENIES) Delusions: None noted  Mental Status Report Appearance/Hygiene: Body odor (SEEMED TO SMELL OF ALCOHOL) Eye Contact: Good Motor Activity: Freedom of movement Speech: Logical/coherent Level of  Consciousness: Alert Mood: Depressed Affect: Blunted, Depressed Anxiety Level: Minimal Thought Processes: Coherent, Relevant Judgement: Partial Orientation: Person, Place, Time, Situation Obsessive Compulsive Thoughts/Behaviors: None  Cognitive Functioning Concentration: Normal Memory: Recent Intact, Remote Intact IQ: Average Insight: Fair Impulse Control: Poor Appetite: Poor Weight Loss:  (40 LBS SINCE MARCH DUE TO DECREASED APPETITE) Weight Gain:  (0) Sleep: No Change Total Hours of Sleep:  (6 AVG WITH A CPAP) Vegetative Symptoms: None  ADLScreening [facility] Assessment Services) Patient's cognitive ability adequate to safely complete daily activities?: Yes Patient able to express need for assistance with ADLs?: Yes Independently performs ADLs?: Yes (appropriate for developmental age) (NO BARRIERS REPORTED)  Prior Inpatient Therapy Prior Inpatient Therapy: No  Prior Outpatient Therapy Prior Outpatient Therapy: No Does patient have an ACCT team?: No Does patient have Intensive In-House Services?  : No Does patient have [facility] services? : No Does patient have P4CC services?: No  ADL Screening (condition at time of admission) Patient's cognitive ability adequate to safely complete daily activities?: Yes Patient able to express need for assistance with ADLs?: Yes Independently performs ADLs?: Yes (appropriate for developmental age) (NO BARRIERS REPORTED)       Abuse/Neglect Assessment (Assessment to be complete while patient is alone) Physical Abuse: Denies Verbal Abuse: Denies Sexual Abuse: Denies Exploitation of patient/patient's resources: Denies Self-Neglect: Denies     Merchant navy officer (For Healthcare) Does Patient Have a Medical Advance Directive?: No Would patient like information on creating a medical advance directive?: No - Patient declined    Additional Information 1:1 In Past 12 Months?: No CIRT Risk: No Elopement Risk: No Does patient have  medical clearance?: No (SENT TO [facility] FOR MED CLEARANCE)     Disposition:  Disposition Initial Assessment Completed for this Encounter: Yes Disposition of Patient: Inpatient treatment program (PER SPENCER SIMON, PA) Type of inpatient treatment program: Adult (NO APPROPRIATE BEDS AT [facility] CURRENTLY)  Azad Calame T 11/22/2016 4:05 AM

## 2016-11-22 NOTE — H&P (Signed)
Behavioral Health Medical Screening Exam  Richard ReadyDaniel S Eisenhuth is an 41 y.o. male, [facility]. He is endorsing commensurate depressive feelings but denying SI/SA or HI. He has been abusing alcohol for 25 years, with multiple losses to include declining health, DWI x two failed relationships and los of job. He is denying endorses DT's and W/d symptoms within 12 hours of not drinking. He has been consuming a 1/2 fifth to a whole fifth daily. He endorses a family hx of alcoholism He is denying any acute ailments at this time.  Total Time spent with patient: 20 minutes  Psychiatric Specialty Exam: Physical Exam  Constitutional: He is oriented to person, place, and time. He appears well-developed and well-nourished. No distress.  HENT:  Head: Normocephalic.  Eyes: Pupils are equal, round, and reactive to light.  Respiratory: Effort normal and breath sounds normal. No respiratory distress.  Neurological: He is alert and oriented to person, place, and time. No cranial nerve deficit.  Skin: Skin is warm and dry. He is not diaphoretic.  Psychiatric: His speech is normal and behavior is normal. Judgment normal. His mood appears anxious. Thought content is not paranoid and not delusional. Cognition and memory are impaired. He expresses no homicidal ideation.    Review of Systems  Psychiatric/Behavioral: Positive for depression and substance abuse. The patient is nervous/anxious.   All other systems reviewed and are negative.   There were no vitals taken for this visit.There is no height or weight on file to calculate BMI.  General Appearance: Disheveled  Eye Contact:  Good  Speech:  Clear and Coherent  Volume:  Normal  Mood:  Depressed  Affect:  Congruent  Thought Process:  Goal Directed  Orientation:  Full (Time, Place, and Person)  Thought Content:  Rumination  Suicidal Thoughts:  Yes.  without intent/plan  Homicidal Thoughts:  No  Memory:  Immediate;   Good   Judgement:  Impaired  Insight:  Lacking  Psychomotor Activity:  Negative  Concentration: Concentration: Fair  Recall:  Good  Fund of Knowledge:Good  Language: Good  Akathisia:  Negative  Handed:  Right  AIMS (if indicated):     Assets:  Desire for Improvement  Sleep:       Musculoskeletal: Strength & Muscle Tone: within normal limits Gait & Station: normal Patient leans: N/A  There were no vitals taken for this visit.  Recommendations:  Based on my evaluation the patient does not appear to have an emergency medical condition.  Kerry HoughSpencer E Raveen Wieseler, PA-C 11/22/2016, 3:25 AM

## 2016-11-22 NOTE — [facility] Suicide Risk Assessment (Signed)
Suicide Risk Assessment  Discharge Assessment   [facility]   Principal Problem: Alcohol abuse with alcohol-induced mood disorder [facility](HCC) Discharge Diagnoses:  Patient Active Problem List   Diagnosis Date Noted  . Alcohol abuse with alcohol-induced mood disorder (HCC) [F10.14] 11/22/2016    Priority: High  . Hypotension [I95.9] 07/06/2015  . 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 Morbid obesity due to excess calories (HCC) [E66.01] 06/26/2015  . Sepsis (HCC) [A41.9] 06/26/2015  . Cellulitis [L03.90] 06/24/2015  . Cellulitis of right leg [L03.115] 06/24/2015  . Acute kidney injury (HCC) [N17.9] 06/24/2015  . Leucocytosis [D72.829] 06/24/2015  . OSA (obstructive sleep apnea) [G47.33] 09/23/2014    Total Time spent with patient: 30 minutes  Musculoskeletal: Strength & Muscle Tone: within normal limits Gait & Station: normal Patient leans: N/A  Psychiatric Specialty Exam:   Blood pressure 117/76, pulse 66, temperature 97.4 F (36.3 C), temperature source Oral, resp. rate 18, SpO2 100 %.There is no height or weight on file to calculate BMI.  General Appearance: Casual  Eye Contact::  Good  Speech:  Normal Rate409  Volume:  Normal  Mood:  Hopeless and Worthless  Affect:  Congruent  Thought Process:  Coherent, Goal Directed and Linear  Orientation:  Full (Time, Place, and Person)  Thought Content:  Logical  Suicidal Thoughts:  No  Homicidal Thoughts:  No  Memory:  Immediate;   Good Recent;   Good Remote;   Fair  Judgement:  Fair  Insight:  Present  Psychomotor Activity:  Normal  Concentration:  Good  Recall:  Good  Fund of Knowledge:Good  Language: Good  Akathisia:  No  Handed:  Right  AIMS (if indicated):     Assets:  Communication Skills Desire for Improvement Financial Resources/Insurance Housing Physical Health Resilience Social Support Transportation Vocational/Educational  Sleep:     Cognition: WNL   ADL's:  Intact   Mental Status Per Nursing Assessment::   On Admission:     Demographic Factors:  Male, Caucasian and Living alone  Loss Factors: Loss of significant relationship  Historical Factors: NA  Risk Reduction Factors:   Employed  Continued Clinical Symptoms:  Alcohol/Substance Abuse/Dependencies  Cognitive Features That Contribute To Risk:  None    Suicide Risk:  Minimal: No identifiable suicidal ideation.  Patients presenting with no risk factors but with morbid ruminations; may be classified as minimal risk based on the severity of the depressive symptoms    Plan Of Care/Follow-up recommendations:  Activity:  as tolerated Diet:  Heart healthy  Laveda AbbeLaurie Britton Ranata Laughery, NP 11/22/2016, 11:02 AM

## 2016-11-22 NOTE — BH Assessment (Signed)
[facility] Assessment Progress Note  Per Thedore MinsMojeed Akintayo, MD, this pt does not require psychiatric hospitalization at this time.  [facility]  This has been included in pt's discharge instructions.  Pt's nurse has been notified.  Doylene Canninghomas Dustie Brittle, MA Triage Specialist [phone]

## 2016-11-22 NOTE — ED Triage Notes (Signed)
[facility] for alcohol detox Pt's last drink was about midnight

## 2016-11-22 NOTE — Discharge Instructions (Signed)
To help you maintain a sober lifestyle, a substance abuse treatment program may be beneficial to you.  Contact the [facility] at your earliest opportunity and ask about their Chemical Dependency Intensive Outpatient Program:       [facility]      [address]      [place], [place] [address]      [phone]

## 2016-11-22 NOTE — ED Provider Notes (Signed)
[facility]-EMERGENCY DEPT Provider Note   CSN: [id number] Arrival date & time: 11/22/16  0354     History   Chief Complaint Chief Complaint  Patient presents with  . Delirium Tremens (DTS)    HPI Richard Osborn is a 41 y.o. male.  Patient presents to the emergency department for evaluation of alcohol abuse. Patient reports that he has been drinking since he was 41 years old. He has never been through a detox program. He has never completely stopped drinking because he does get shaky and can't function and less he has alcohol. He has decided that he needs to stop drinking now. He was referred here for help. He has never had an alcohol-related seizure or full-blown DTs.  Patient also concerned because his blood pressure has been running high. He normally has low blood pressure. He notices swelling in his legs, this has been chronic for some time. He notices that it worsens when he drinks heavily. He has had a previous cellulitis in the right leg secondary to chronic edema. No evidence of redness, signs of infection currently. Patient also concerned because he had a physical at work recently and there was protein in his urine. He never followed up.      Past Medical History:  Diagnosis Date  . Sleep apnea     Patient Active Problem List   Diagnosis Date Noted  . Alcohol abuse with alcohol-induced mood disorder (HCC) 11/22/2016  . Chronic alcoholism (HCC)   . Hypotension 07/06/2015  . 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 Morbid obesity due to excess calories (HCC) 06/26/2015  . Sepsis (HCC) 06/26/2015  . Cellulitis 06/24/2015  . Cellulitis of right leg 06/24/2015  . Acute kidney injury (HCC) 06/24/2015  . Leucocytosis 06/24/2015  . OSA (obstructive sleep apnea) 09/23/2014    Past Surgical History:  Procedure Laterality Date  . NASAL SEPTUM SURGERY    . ORIF ULNAR / RADIAL SHAFT FRACTURE         Home Medications    Prior to Admission medications     Medication Sig Start Date End Date Taking? Authorizing Provider  acetaminophen (TYLENOL) 325 MG tablet Take 2 tablets (650 mg total) by mouth every 6 (six) hours as needed for mild pain (or Fever >/= 101). Patient not taking: Reported on 01/04/2016 06/29/15   Alison Murray, MD  cephALEXin (KEFLEX) 500 MG capsule Take 1 capsule (500 mg total) by mouth 2 (two) times daily. Patient not taking: Reported on 01/04/2016 07/09/15   Penny Pia, MD    Family History Family History  Problem Relation Age of Onset  . Diabetes Unknown        grandmother    Social History Social History  Substance Use Topics  . Smoking status: Current Every Day Smoker    Packs/day: 1.00    Years: 20.00    Types: Cigarettes  . Smokeless tobacco: Never Used  . Alcohol use 0.0 oz/week     Allergies   Patient has no known allergies.   Review of Systems Review of Systems  Cardiovascular: Positive for leg swelling.  Neurological: Positive for tremors.  All other systems reviewed and are negative.    Physical Exam Updated Vital Signs BP 127/80 (BP Location: Right Arm)   Pulse 71   Temp 97.4 F (36.3 C) (Oral)   Resp 18   SpO2 98%   Physical Exam  Constitutional: He is oriented to person, place, and time. He appears well-developed and well-nourished. No distress.  HENT:  Head: Normocephalic and atraumatic.  Right Ear: Hearing normal.  Left Ear: Hearing normal.  Nose: Nose normal.  Mouth/Throat: Oropharynx is clear and moist and mucous membranes are normal.  Eyes: Conjunctivae and EOM are normal. Pupils are equal, round, and reactive to light.  Neck: Normal range of motion. Neck supple.  Cardiovascular: Regular rhythm, S1 normal and S2 normal.  Exam reveals no gallop and no friction rub.   No murmur heard. Pulmonary/Chest: Effort normal and breath sounds normal. No respiratory distress. He exhibits no tenderness.  Abdominal: Soft. Normal appearance and bowel sounds are normal. There is no  hepatosplenomegaly. There is no tenderness. There is no rebound, no guarding, no tenderness at McBurney's point and negative Murphy's sign. No hernia.  Musculoskeletal: Normal range of motion. He exhibits edema.  Neurological: He is alert and oriented to person, place, and time. He has normal strength. No cranial nerve deficit or sensory deficit. Coordination normal. GCS eye subscore is 4. GCS verbal subscore is 5. GCS motor subscore is 6.  Skin: Skin is warm, dry and intact. No rash noted. No cyanosis.  Psychiatric: He has a normal mood and affect. His speech is normal and behavior is normal. Thought content normal.  Nursing note and vitals reviewed.    ED Treatments / Results  Labs (all labs ordered are listed, but only abnormal results are displayed) Labs Reviewed  COMPREHENSIVE METABOLIC PANEL - Abnormal; Notable for the following:       Result Value   Potassium 3.4 (*)    Calcium 8.3 (*)    Albumin 3.3 (*)    Total Bilirubin 2.1 (*)    All other components within normal limits  ETHANOL - Abnormal; Notable for the following:    Alcohol, Ethyl (B) 138 (*)    All other components within normal limits  CBC - Abnormal; Notable for the following:    Hemoglobin 17.3 (*)    MCV 111.0 (*)    MCH 38.7 (*)    All other components within normal limits  URINALYSIS, ROUTINE W REFLEX MICROSCOPIC - Abnormal; Notable for the following:    Color, Urine AMBER (*)    Bilirubin Urine SMALL (*)    Ketones, ur 5 (*)    All other components within normal limits  RAPID URINE DRUG SCREEN, HOSP PERFORMED    EKG  EKG Interpretation None       Radiology No results found.  Procedures Procedures (including critical care time)  Medications Ordered in ED Medications - No data to display   Initial Impression / Assessment and Plan / ED Course  I have reviewed the triage vital signs and the nursing notes.  Pertinent labs & imaging results that were available during my care of the patient  were reviewed by me and considered in my medical decision making (see chart for details).     Patient presents with concern over his alcoholism. He has been evaluated at behavioral health was referred here for medical clearance. His workup has been unremarkable, patient medically clear.  Final Clinical Impressions(s) / ED Diagnoses   Final diagnoses:  Chronic alcoholism (HCC)  Depression, unspecified depression type  Alcohol abuse with alcohol-induced mood disorder [facility](HCC)    New Prescriptions Discharge Medication List as of 11/22/2016 11:44 AM       Blinda LeatherwoodPollina, Canary Brimhristopher J, MD 11/27/16 2351

## 2016-11-22 NOTE — ED Provider Notes (Signed)
Care assumed from Dr. Blinda LeatherwoodPollina.   At time of transfer of care, patient is awaiting psychiatric disposition and evaluation this morning. He was medically cleared by previous provider.  According to documentation, Psychiatry team assessed patient and felt he was safe for discharge. Patient discharged by psychiatry team.      Richard Osborn, Canary Brimhristopher J, MD 11/22/16 [phone]

## 2017-01-09 ENCOUNTER — Telehealth: Payer: Self-pay | Admitting: Adult Health

## 2017-01-09 NOTE — Telephone Encounter (Signed)
Went out to lobby and [facility] Message routed to TP and copy of download placed in TP's red lookat folder

## 2017-01-13 NOTE — Telephone Encounter (Signed)
Per TP: good control and usage.  AHI is good.  No changes in Rx but he is overdue for sleep follow up.  Thank you.

## 2017-01-13 NOTE — Telephone Encounter (Signed)
lmtcb x1 for pt. 

## 2017-01-13 NOTE — Telephone Encounter (Signed)
TP please advise on download. Thanks.

## 2017-01-16 NOTE — Telephone Encounter (Signed)
lmtcb x2 for pt. 

## 2017-01-17 NOTE — Telephone Encounter (Signed)
Attempted to contact the pt. Received a recording that the number I was calling is not a working number.

## 2018-05-30 DIAGNOSIS — Z86711 Personal history of pulmonary embolism: Secondary | ICD-10-CM

## 2018-05-30 HISTORY — DX: Personal history of pulmonary embolism: Z86.711

## 2018-11-18 ENCOUNTER — Emergency Department (HOSPITAL_COMMUNITY): Payer: BC Managed Care – PPO

## 2018-11-18 ENCOUNTER — Encounter (HOSPITAL_COMMUNITY): Payer: Self-pay

## 2018-11-18 ENCOUNTER — Inpatient Hospital Stay (HOSPITAL_COMMUNITY)
Admission: EM | Admit: 2018-11-18 | Discharge: 2018-11-21 | DRG: 175 | Disposition: A | Discharge status: Home / Self Care | Payer: BC Managed Care – PPO | Attending: Family Medicine | Admitting: Family Medicine

## 2018-11-18 ENCOUNTER — Other Ambulatory Visit: Payer: Self-pay

## 2018-11-18 DIAGNOSIS — I2699 Other pulmonary embolism without acute cor pulmonale: Secondary | ICD-10-CM | POA: Diagnosis not present

## 2018-11-18 DIAGNOSIS — Z87891 Personal history of nicotine dependence: Secondary | ICD-10-CM

## 2018-11-18 DIAGNOSIS — N182 Chronic kidney disease, stage 2 (mild): Secondary | ICD-10-CM | POA: Diagnosis present

## 2018-11-18 DIAGNOSIS — N179 Acute kidney failure, unspecified: Secondary | ICD-10-CM | POA: Diagnosis present

## 2018-11-18 DIAGNOSIS — I2609 Other pulmonary embolism with acute cor pulmonale: Principal | ICD-10-CM | POA: Diagnosis present

## 2018-11-18 DIAGNOSIS — G4733 Obstructive sleep apnea (adult) (pediatric): Secondary | ICD-10-CM | POA: Diagnosis present

## 2018-11-18 DIAGNOSIS — I445 Left posterior fascicular block: Secondary | ICD-10-CM | POA: Diagnosis present

## 2018-11-18 DIAGNOSIS — Z6841 Body Mass Index (BMI) 40.0 and over, adult: Secondary | ICD-10-CM

## 2018-11-18 DIAGNOSIS — D7589 Other specified diseases of blood and blood-forming organs: Secondary | ICD-10-CM | POA: Diagnosis present

## 2018-11-18 DIAGNOSIS — I517 Cardiomegaly: Secondary | ICD-10-CM | POA: Diagnosis present

## 2018-11-18 DIAGNOSIS — R0602 Shortness of breath: Secondary | ICD-10-CM | POA: Diagnosis not present

## 2018-11-18 DIAGNOSIS — F1020 Alcohol dependence, uncomplicated: Secondary | ICD-10-CM | POA: Diagnosis present

## 2018-11-18 DIAGNOSIS — I82431 Acute embolism and thrombosis of right popliteal vein: Secondary | ICD-10-CM | POA: Diagnosis present

## 2018-11-18 DIAGNOSIS — Z20828 Contact with and (suspected) exposure to other viral communicable diseases: Secondary | ICD-10-CM | POA: Diagnosis present

## 2018-11-18 LAB — BASIC METABOLIC PANEL
Anion gap: 16 — ABNORMAL HIGH (ref 5–15)
BUN: 12 mg/dL (ref 6–20)
CO2: 15 mmol/L — ABNORMAL LOW (ref 22–32)
Calcium: 8.7 mg/dL — ABNORMAL LOW (ref 8.9–10.3)
Chloride: 104 mmol/L (ref 98–111)
Creatinine, Ser: 1.47 mg/dL — ABNORMAL HIGH (ref 0.61–1.24)
GFR calc Af Amer: >60 mL/min (ref >60)
GFR calc non Af Amer: 58 mL/min — ABNORMAL LOW (ref >60)
Glucose, Bld: 154 mg/dL — ABNORMAL HIGH (ref 70–99)
Potassium: 4.9 mmol/L (ref 3.5–5.1)
Sodium: 135 mmol/L (ref 135–145)

## 2018-11-18 LAB — CBC
HCT: 53.1 % — ABNORMAL HIGH (ref 39.0–52.0)
Hemoglobin: 19.2 g/dL — ABNORMAL HIGH (ref 13.0–17.0)
MCH: 42.5 pg — ABNORMAL HIGH (ref 26.0–34.0)
MCHC: 36.2 g/dL — ABNORMAL HIGH (ref 30.0–36.0)
MCV: 117.5 fL — ABNORMAL HIGH (ref 80.0–100.0)
Platelets: 246 10*3/uL (ref 150–400)
RBC: 4.52 MIL/uL (ref 4.22–5.81)
RDW: 13.7 % (ref 11.5–15.5)
WBC: 18.8 10*3/uL — ABNORMAL HIGH (ref 4.0–10.5)
nRBC: 0.4 % — ABNORMAL HIGH (ref 0.0–0.2)

## 2018-11-18 LAB — TROPONIN I: Troponin I: 0.46 ng/mL (ref <0.03)

## 2018-11-18 LAB — ACETAMINOPHEN LEVEL: Acetaminophen (Tylenol), Serum: <10 ug/mL — ABNORMAL LOW (ref 10–30)

## 2018-11-18 LAB — SALICYLATE LEVEL: Salicylate Lvl: <7 mg/dL (ref 2.8–30.0)

## 2018-11-18 MED ORDER — SODIUM CHLORIDE 0.9% FLUSH
3.0000 mL | Freq: Once | INTRAVENOUS | Status: AC
  Administered 2018-11-19: 3 mL via INTRAVENOUS

## 2018-11-18 MED ORDER — SODIUM CHLORIDE 0.9 % IV BOLUS
500.0000 mL | Freq: Once | INTRAVENOUS | Status: AC
  Administered 2018-11-19: 500 mL via INTRAVENOUS

## 2018-11-18 MED ORDER — IOHEXOL 350 MG/ML SOLN
75.0000 mL | Freq: Once | INTRAVENOUS | Status: AC | PRN
  Administered 2018-11-18: 75 mL via INTRAVENOUS

## 2018-11-18 MED ORDER — ALBUTEROL SULFATE (2.5 MG/3ML) 0.083% IN NEBU: 5.0000 mg | INHALATION_SOLUTION | Freq: Once | RESPIRATORY_TRACT | Status: DC

## 2018-11-18 NOTE — ED Provider Notes (Signed)
[facility] EMERGENCY DEPARTMENT Provider Note   CSN: [id number] Arrival date & time: 11/18/18  2144     History   Chief Complaint Chief Complaint  Patient presents with  . Shortness of Breath    HPI Richard Osborn is a 43 y.o. male w/ a hx of OSA, morbid obesity, & EtOH abuse who presents to the ED w/ complaints of exertional dyspnea x 12 days. Patient reports dyspnea on exertion which can occur w/ very minimal activity but further aggravated as level of exertion increases. Sxs initially were resolved with rest, however today his dyspnea has worsened and is now only somewhat alleviated with rest but does not completely resolve. Today he has also noted palpitations, lightheadedness, nausea, & diaphoresis with worsened shortness of breath with activity, feels as if he may pass out but has not had full syncope. Worsened sxs prompted ER visit. He states he has had minimal dry cough. Notes intermittent bilateral lower extremity swelling but no acute change w/ his dyspnea, no unilateral leg pain. Denies chest pain, fever, chills,, hemoptysis, recent surgery/trauma, recent long travel, hormone use, personal hx of cancer, or hx of DVT/PE. Patient admits to former tobacco abuse- has quit using chantix. Notes he use to be a heavy drinker, now drinking a few times per week 4-5 beers at a time. Somewhat poor PO intake today.       HPI  Past Medical History:  Diagnosis Date  . Sleep apnea     Patient Active Problem List   Diagnosis Date Noted  . Alcohol abuse with alcohol-induced mood disorder (HCC) 11/22/2016  . Chronic alcoholism (HCC)   . Hypotension 07/06/2015  . 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 Morbid obesity due to excess calories (HCC) 06/26/2015  . Sepsis (HCC) 06/26/2015  . Cellulitis 06/24/2015  . Cellulitis of right leg 06/24/2015  . Acute kidney injury (HCC) 06/24/2015  . Leucocytosis 06/24/2015  . OSA (obstructive sleep apnea) 09/23/2014     Past Surgical History:  Procedure Laterality Date  . NASAL SEPTUM SURGERY    . ORIF ULNAR / RADIAL SHAFT FRACTURE          Home Medications    Prior to Admission medications   Medication Sig Start Date End Date Taking? Authorizing Provider  acetaminophen (TYLENOL) 325 MG tablet Take 2 tablets (650 mg total) by mouth every 6 (six) hours as needed for mild pain (or Fever >/= 101). Patient not taking: Reported on 01/04/2016 06/29/15   Alison Murray, MD  cephALEXin (KEFLEX) 500 MG capsule Take 1 capsule (500 mg total) by mouth 2 (two) times daily. Patient not taking: Reported on 01/04/2016 07/09/15   Penny Pia, MD    Family History Family History  Problem Relation Age of Onset  . Diabetes Other        grandmother    Social History Social History   Tobacco Use  . Smoking status: Former Smoker    Packs/day: 1.00    Years: 20.00    Pack years: 20.00    Types: Cigarettes  . Smokeless tobacco: Never Used  Substance Use Topics  . Alcohol use: Yes    Alcohol/week: 0.0 standard drinks    Comment: occassionally  . Drug use: No     Allergies   Patient has no known allergies.   Review of Systems Review of Systems  Constitutional: Positive for diaphoresis. Negative for chills and fever.  Respiratory: Positive for cough and shortness of breath.  Cardiovascular: Positive for palpitations and leg swelling (BLE intermittent, no acute change). Negative for chest pain.  Gastrointestinal: Positive for nausea. Negative for abdominal pain, blood in stool, constipation, diarrhea and vomiting.  Neurological: Positive for light-headedness. Negative for syncope, speech difficulty and numbness.  All other systems reviewed and are negative.  Physical Exam Updated Vital Signs BP 128/71 (BP Location: Right Arm)   Pulse (!) 145   Temp (!) 97.5 F (36.4 C) (Oral)   Resp (!) 30   SpO2 96%   Physical Exam Vitals signs and nursing note reviewed.  Constitutional:      Appearance:  He is well-developed. He is obese. He is not toxic-appearing.  HENT:     Head: Normocephalic and atraumatic.  Eyes:     General:        Right eye: No discharge.        Left eye: No discharge.     Conjunctiva/sclera: Conjunctivae normal.  Neck:     Musculoskeletal: Neck supple.     Vascular: No JVD.  Cardiovascular:     Rate and Rhythm: Regular rhythm. Tachycardia present.  Pulmonary:     Breath sounds: No wheezing, rhonchi or rales.     Comments: SpO2 93-95% on RA. Improved to > 95% w/ 2L via [place] Abdominal:     General: There is no distension.     Palpations: Abdomen is soft.     Tenderness: There is no abdominal tenderness.  Musculoskeletal:     Right lower leg: He exhibits no tenderness. No edema.     Left lower leg: He exhibits no tenderness. No edema.  Skin:    General: Skin is warm and dry.     Findings: No rash.  Neurological:     Mental Status: He is alert.     Comments: Clear speech.   Psychiatric:        Behavior: Behavior normal.    ED Treatments / Results  Labs (all labs ordered are listed, but only abnormal results are displayed) Labs Reviewed  BASIC METABOLIC PANEL - Abnormal; Notable for the following components:      Result Value   CO2 15 (*)    Glucose, Bld 154 (*)    Creatinine, Ser 1.47 (*)    Calcium 8.7 (*)    GFR calc non Af Amer 58 (*)    Anion gap 16 (*)    All other components within normal limits  CBC - Abnormal; Notable for the following components:   WBC 18.8 (*)    Hemoglobin 19.2 (*)    HCT 53.1 (*)    MCV 117.5 (*)    MCH 42.5 (*)    MCHC 36.2 (*)    nRBC 0.4 (*)    All other components within normal limits  TROPONIN I - Abnormal; Notable for the following components:   Troponin I 0.46 (*)    All other components within normal limits  BRAIN NATRIURETIC PEPTIDE - Abnormal; Notable for the following components:   B Natriuretic Peptide 270.3 (*)    All other components within normal limits  ACETAMINOPHEN LEVEL - Abnormal; Notable  for the following components:   Acetaminophen (Tylenol), Serum <10 (*)    All other components within normal limits  SARS CORONAVIRUS 2 (HOSPITAL ORDER, PERFORMED IN [facility])  SALICYLATE LEVEL  HEPARIN LEVEL (UNFRACTIONATED)    EKG    EKG Interpretation  Date/Time:  Sunday November 18 2018 22:51:06 EDT Ventricular Rate:  142 PR Interval:  QRS Duration: 96 QT Interval:  359 QTC Calculation: 552 R Axis:   152 Text Interpretation:  Sinus tachycardia Left posterior fascicular block Probable anteroseptal infarct, recent Prolonged QT interval Confirmed by Zadie RhineWickline, Donald ([id number]) on 11/18/2018 11:08:15 PM  EKG Interpretation  Date/Time:  Sunday November 18 2018 23:16:26 EDT Ventricular Rate:  137 PR Interval:    QRS Duration: 93 QT Interval:  315 QTC Calculation: 476 R Axis:   147 Text Interpretation:  Sinus or ectopic atrial tachycardia Left posterior fascicular block Probable anteroseptal infarct, recent T wave inversion Abnormal ekg Confirmed by Zadie RhineWickline, Donald ([id number]) on 11/18/2018 11:25:19 PM    Radiology Ct Angio Chest Pe W/cm &/or Wo Cm  Result Date: 11/19/2018 CLINICAL DATA:  43 year old male with shortness of breath. History of sleep. EXAM: CT ANGIOGRAPHY CHEST WITH CONTRAST TECHNIQUE: Multidetector CT imaging of the chest was performed using the standard protocol during bolus administration of intravenous contrast. Multiplanar CT image reconstructions and MIPs were obtained to evaluate the vascular anatomy. CONTRAST:  75mL OMNIPAQUE IOHEXOL 350 MG/ML SOLN COMPARISON:  Chest radiograph dated 11/18/2018 FINDINGS: Cardiovascular: There is no cardiomegaly or pericardial effusion. There is dilatation of the right ventricle measuring approximately 5.2 mm in diameter. The left ventricle measures approximately 2.3 mm in diameter. There is flattening of the interventricular septum. The RV/LV ratio is 2.3. There is retrograde flow of contrast from the right atrium into the  IVC indicative of right heart dysfunction. The thoracic aorta is unremarkable for the degree of opacification. Large bilateral pulmonary artery emboli extending from the central pulmonary arteries into the lobar, segmental, and subsegmental branches of the upper and lower lobes bilaterally. Mediastinum/Nodes: There is no hilar or mediastinal adenopathy. The esophagus is grossly unremarkable. No mediastinal fluid collection or hematoma. Lungs/Pleura: The lungs are clear. There is no pleural effusion or pneumothorax. The central airways are patent. Upper Abdomen: No acute abnormality. Musculoskeletal: Mild degenerative changes. Review of the MIP images confirms the above findings. IMPRESSION: Large bilateral pulmonary artery emboli with CT evidence of right heart strain (RV/LV Ratio = 2.3) consistent with at least submassive (intermediate risk) PE. The presence of right heart strain has been associated with an increased risk of morbidity and mortality. Please activate Code PE by paging [phone]. These results were called by telephone at the time of interpretation on 11/19/2018 at 12:10 am to Dr. Bebe ShaggyWickline, who verbally acknowledged these results. Electronically Signed   By: Elgie CollardArash  Radparvar M.D.   On: 11/19/2018 00:18   Dg Chest Portable 1 View  Result Date: 11/18/2018 CLINICAL DATA:  43 year old male which shortness of breath. EXAM: PORTABLE CHEST 1 VIEW COMPARISON:  None. FINDINGS: There is mild cardiomegaly. There is mild prominence of the central and hilar vasculature. No focal consolidation, pleural effusion, or pneumothorax. No acute osseous pathology. IMPRESSION: Mild cardiomegaly with possible mild vascular congestion. No focal consolidation. Electronically Signed   By: Elgie CollardArash  Radparvar M.D.   On: 11/18/2018 22:57    Procedures Procedures (including critical care time)  CRITICAL CARE Performed by: Harvie HeckSamantha    Total critical care time: 50 minutes  Critical care time was exclusive of  separately billable procedures and treating other patients.  Critical care was necessary to treat or prevent imminent or life-threatening deterioration.  Critical care was time spent personally by me on the following activities: development of treatment plan with patient and/or surrogate as well as nursing, discussions with consultants, evaluation of patient's response to treatment, examination of patient, obtaining history from patient or surrogate, ordering and  performing treatments and interventions, ordering and review of laboratory studies, ordering and review of radiographic studies, pulse oximetry and re-evaluation of patient's condition.   Medications Ordered in ED Medications  sodium chloride flush (NS) 0.9 % injection 3 mL (has no administration in time range)     Initial Impression / Assessment and Plan / ED Course  I have reviewed the triage vital signs and the nursing notes.  Pertinent labs & imaging results that were available during my care of the patient were reviewed by me and considered in my medical decision making (see chart for details).   Patient w/ hx of OSA & EtOH abuse who presents to the ED w/ exertional dyspnea x 12 days. He appears to be in sinus tach on monitor, correlated w/ EKG but am considering aflutter, tachypnic w/ SpO2 93-95% on RA improved to > 51% w/ application of 2L via [place]. Afebrile. Intermittent soft pressures. Lungs clear. No significant peripheral edema or JVD. DDX: PE, ACS, CHF, pneumonia, COVID 76, PTX, anemia, electrolyte derangement, aflutter/afib (though initial EKG appears to be sinus tach), EtOH withdrawal (seems less likely). Start fluids for his tachycardia.   EKG: Sinus tachycardia, will repeat to assess for aflutter, but appears regular CXR:  Mild cardiomegaly with possible mild vascular congestion. No focal consolidation.--> will limit fluids to 500 cc bolus.  CBC: Leukocytosis @ 18.8. elevated Hgb/hct. Suspect some degree of  hemoconcentration.  BMP: Bicarb w/ anion gap elevation- Dr. Christy Gentles has added on acetaminophen/salicylate levels. Mild AKI w/ creatinine @ 1.47, prior 0.83 one year prior.  Troponin: elevated @ 0.46  Repeat EKG: Sinus tachycardia, recent T wave inversions.  BNP, tox screens, & CTA pending. Covid swab ordered as admission is anticipated.   Tox screens negative BNP: Mildly elevated @ 270.3   Dr. Christy Gentles spoke w/ radiology- patient has bilateral PE w/ R heart strain- will consult ICU for given CTA findings w/ his persistent tachycardia, mild hypoxia, and intermittent soft blood pressures. Heparin per pharmacy.   CTA: Large bilateral pulmonary artery emboli with CT evidence of right heart strain (RV/LV Ratio = 2.3) consistent with at least submassive (intermediate risk) PE. The presence of right heart strain has been associated with an increased risk of morbidity and mortality.   00:20: CONSULT: Discussed case with intensivist Dr. James Ivanoff, ICU team coming to the ER to assess patient & determine if candidate for intervention vs. Systemic tPA. In agreement w/ heparin.   Patient updated on results & plan of care. Provided opportunity for questions, patient confirmed understanding & is agreeable.   01:00: CONSULT: Discussed w/ ICU team Dr. Claudie Leach who has evaluated patient- recommends admission to hospitalist service w/ consultation by AM team to IR tomorrow morning regarding intervention, does not feel patient needs ICU care. ICU team available for re-consultation should patient's clinical status change.   01:21: CONSULT: Discussed w/ hospitalist Dr. Hal Hope who accepts admission.   This is a shared visit with supervising physician Dr. Christy Gentles who has independently evaluated patient & provided guidance in evaluation/management/disposition, in agreement with care   Valinda Hoar was evaluated in Emergency Department on 11/19/2018 for the symptoms described in the history of present illness. He/she  was evaluated in the context of the global COVID-19 pandemic, which necessitated consideration that the patient might be at risk for infection with the SARS-CoV-2 virus that causes COVID-19. Institutional protocols and algorithms that pertain to the evaluation of patients at risk for COVID-19 are in a state of rapid change based on information  released by regulatory bodies including the CDC and federal and state organizations. These policies and algorithms were followed during the patient's care in the ED.  Final Clinical Impressions(s) / ED Diagnoses   Final diagnoses:  Bilateral pulmonary embolism [facility](HCC)    ED Discharge Orders    None       Cherly Andersonetrucelli,  R, PA-C 11/19/18 0131    Zadie RhineWickline, Donald, MD 11/19/18 [phone]

## 2018-11-18 NOTE — ED Provider Notes (Signed)
Patient seen/examined in the Emergency Department in conjunction with Advanced Practice Provider  Patient reports sob and weakness Exam : awake/alert, tachycardic, mild tachypnea Plan: will need evaluation for PE and admission ekg abnormal, appears sinus tach at this time    Ripley Fraise, MD 11/18/18 2326

## 2018-11-18 NOTE — ED Notes (Signed)
Per lab ok to add BNP, salicylate and apap level

## 2018-11-18 NOTE — ED Triage Notes (Signed)
Pt from home w/ a c/o SOB x1 week without exertion and dizziness that began today. No LOC. Pt denies CP. Reports that he hiked 3 miles about 3 weeks ago, which is more activity than what is normal for him. Additional complaints of a dry cough that began one week ago.

## 2018-11-18 NOTE — ED Notes (Signed)
Pt had arrived w/ a HR in the upper 140s and complained of SOB at the time. His HR suddenly dropped down to the 70s and the SOB subsided. Unable to assess rhythm.

## 2018-11-19 ENCOUNTER — Inpatient Hospital Stay (HOSPITAL_COMMUNITY): Payer: BC Managed Care – PPO

## 2018-11-19 ENCOUNTER — Encounter (HOSPITAL_COMMUNITY): Payer: Self-pay | Admitting: Internal Medicine

## 2018-11-19 DIAGNOSIS — I82431 Acute embolism and thrombosis of right popliteal vein: Secondary | ICD-10-CM | POA: Diagnosis present

## 2018-11-19 DIAGNOSIS — N179 Acute kidney failure, unspecified: Secondary | ICD-10-CM | POA: Diagnosis present

## 2018-11-19 DIAGNOSIS — Z6841 Body Mass Index (BMI) 40.0 and over, adult: Secondary | ICD-10-CM | POA: Diagnosis not present

## 2018-11-19 DIAGNOSIS — I445 Left posterior fascicular block: Secondary | ICD-10-CM | POA: Diagnosis present

## 2018-11-19 DIAGNOSIS — F1020 Alcohol dependence, uncomplicated: Secondary | ICD-10-CM

## 2018-11-19 DIAGNOSIS — D7589 Other specified diseases of blood and blood-forming organs: Secondary | ICD-10-CM | POA: Diagnosis present

## 2018-11-19 DIAGNOSIS — I2609 Other pulmonary embolism with acute cor pulmonale: Secondary | ICD-10-CM | POA: Diagnosis present

## 2018-11-19 DIAGNOSIS — I2699 Other pulmonary embolism without acute cor pulmonale: Secondary | ICD-10-CM

## 2018-11-19 DIAGNOSIS — G4733 Obstructive sleep apnea (adult) (pediatric): Secondary | ICD-10-CM | POA: Diagnosis present

## 2018-11-19 DIAGNOSIS — Z87891 Personal history of nicotine dependence: Secondary | ICD-10-CM | POA: Diagnosis not present

## 2018-11-19 DIAGNOSIS — Z20828 Contact with and (suspected) exposure to other viral communicable diseases: Secondary | ICD-10-CM | POA: Diagnosis present

## 2018-11-19 DIAGNOSIS — N182 Chronic kidney disease, stage 2 (mild): Secondary | ICD-10-CM | POA: Diagnosis present

## 2018-11-19 DIAGNOSIS — R0602 Shortness of breath: Secondary | ICD-10-CM | POA: Diagnosis present

## 2018-11-19 DIAGNOSIS — I517 Cardiomegaly: Secondary | ICD-10-CM | POA: Diagnosis present

## 2018-11-19 LAB — ECHOCARDIOGRAM COMPLETE
Height: 70 in
Weight: 5114.67 oz

## 2018-11-19 LAB — BASIC METABOLIC PANEL
Anion gap: 11 (ref 5–15)
BUN: 11 mg/dL (ref 6–20)
CO2: 20 mmol/L — ABNORMAL LOW (ref 22–32)
Calcium: 8.1 mg/dL — ABNORMAL LOW (ref 8.9–10.3)
Chloride: 105 mmol/L (ref 98–111)
Creatinine, Ser: 1.46 mg/dL — ABNORMAL HIGH (ref 0.61–1.24)
GFR calc Af Amer: >60 mL/min (ref >60)
GFR calc non Af Amer: 58 mL/min — ABNORMAL LOW (ref >60)
Glucose, Bld: 127 mg/dL — ABNORMAL HIGH (ref 70–99)
Potassium: 4.9 mmol/L (ref 3.5–5.1)
Sodium: 136 mmol/L (ref 135–145)

## 2018-11-19 LAB — CBC
HCT: 49.8 % (ref 39.0–52.0)
Hemoglobin: 17.3 g/dL — ABNORMAL HIGH (ref 13.0–17.0)
MCH: 41.5 pg — ABNORMAL HIGH (ref 26.0–34.0)
MCHC: 34.7 g/dL (ref 30.0–36.0)
MCV: 119.4 fL — ABNORMAL HIGH (ref 80.0–100.0)
Platelets: 230 10*3/uL (ref 150–400)
RBC: 4.17 MIL/uL — ABNORMAL LOW (ref 4.22–5.81)
RDW: 13.9 % (ref 11.5–15.5)
WBC: 17.9 10*3/uL — ABNORMAL HIGH (ref 4.0–10.5)
nRBC: 0.4 % — ABNORMAL HIGH (ref 0.0–0.2)

## 2018-11-19 LAB — SARS CORONAVIRUS 2 BY RT PCR (HOSPITAL ORDER, PERFORMED IN ~~LOC~~ HOSPITAL LAB): SARS Coronavirus 2: NEGATIVE

## 2018-11-19 LAB — TROPONIN I
Troponin I: 0.37 ng/mL (ref <0.03)
Troponin I: 0.32 ng/mL (ref <0.03)
Troponin I: 0.2 ng/mL (ref <0.03)

## 2018-11-19 LAB — TSH: TSH: 2.463 u[IU]/mL (ref 0.350–4.500)

## 2018-11-19 LAB — HIV ANTIBODY (ROUTINE TESTING W REFLEX): HIV Screen 4th Generation wRfx: NONREACTIVE

## 2018-11-19 LAB — BRAIN NATRIURETIC PEPTIDE: B Natriuretic Peptide: 270.3 pg/mL — ABNORMAL HIGH (ref 0.0–100.0)

## 2018-11-19 LAB — HEPARIN LEVEL (UNFRACTIONATED)
Heparin Unfractionated: 0.47 IU/mL (ref 0.30–0.70)
Heparin Unfractionated: 0.32 IU/mL (ref 0.30–0.70)

## 2018-11-19 MED ORDER — ACETAMINOPHEN 650 MG RE SUPP: 650.0000 mg | Freq: Four times a day (QID) | RECTAL | Status: DC | PRN

## 2018-11-19 MED ORDER — HEPARIN (PORCINE) 25000 UT/250ML-% IV SOLN
2100.0000 [IU]/h | INTRAVENOUS | Status: DC
  Administered 2018-11-19 – 2018-11-20 (×5): 1900 [IU]/h via INTRAVENOUS
  Filled 2018-11-19 (×5): qty 250

## 2018-11-19 MED ORDER — FOLIC ACID 1 MG PO TABS
1.0000 mg | ORAL_TABLET | Freq: Every day | ORAL | Status: DC
  Administered 2018-11-19 – 2018-11-21 (×3): 1 mg via ORAL
  Filled 2018-11-19 (×3): qty 1

## 2018-11-19 MED ORDER — LORAZEPAM 2 MG/ML IJ SOLN: 1.0000 mg | Freq: Four times a day (QID) | INTRAMUSCULAR | Status: DC | PRN

## 2018-11-19 MED ORDER — THIAMINE HCL 100 MG/ML IJ SOLN: 100.0000 mg | Freq: Every day | INTRAMUSCULAR | Status: DC

## 2018-11-19 MED ORDER — VITAMIN B-1 100 MG PO TABS
100.0000 mg | ORAL_TABLET | Freq: Every day | ORAL | Status: DC
  Administered 2018-11-19 – 2018-11-21 (×3): 100 mg via ORAL
  Filled 2018-11-19 (×3): qty 1

## 2018-11-19 MED ORDER — LORAZEPAM 1 MG PO TABS: 0.0000 mg | ORAL_TABLET | Freq: Two times a day (BID) | ORAL | Status: DC

## 2018-11-19 MED ORDER — SODIUM CHLORIDE 0.9 % IV SOLN
INTRAVENOUS | Status: AC
  Administered 2018-11-19: 07:00:00 via INTRAVENOUS

## 2018-11-19 MED ORDER — LORAZEPAM 1 MG PO TABS
1.0000 mg | ORAL_TABLET | Freq: Four times a day (QID) | ORAL | Status: DC | PRN
  Administered 2018-11-20: 1 mg via ORAL
  Filled 2018-11-19: qty 1

## 2018-11-19 MED ORDER — ADULT MULTIVITAMIN W/MINERALS CH
1.0000 | ORAL_TABLET | Freq: Every day | ORAL | Status: DC
  Administered 2018-11-19 – 2018-11-21 (×3): 1 via ORAL
  Filled 2018-11-19 (×3): qty 1

## 2018-11-19 MED ORDER — LORAZEPAM 1 MG PO TABS
0.0000 mg | ORAL_TABLET | Freq: Four times a day (QID) | ORAL | Status: AC
  Administered 2018-11-20 (×2): 1 mg via ORAL
  Filled 2018-11-19: qty 2
  Filled 2018-11-19: qty 3

## 2018-11-19 MED ORDER — HEPARIN BOLUS VIA INFUSION
6000.0000 [IU] | Freq: Once | INTRAVENOUS | Status: AC
  Administered 2018-11-19: 6000 [IU] via INTRAVENOUS
  Filled 2018-11-19: qty 6000

## 2018-11-19 MED ORDER — PERFLUTREN LIPID MICROSPHERE
1.0000 mL | INTRAVENOUS | Status: AC | PRN
  Administered 2018-11-19: 4 mL via INTRAVENOUS
  Filled 2018-11-19: qty 10

## 2018-11-19 MED ORDER — ONDANSETRON HCL 4 MG/2ML IJ SOLN: 4.0000 mg | Freq: Four times a day (QID) | INTRAMUSCULAR | Status: DC | PRN

## 2018-11-19 MED ORDER — ONDANSETRON HCL 4 MG PO TABS: 4.0000 mg | ORAL_TABLET | Freq: Four times a day (QID) | ORAL | Status: DC | PRN

## 2018-11-19 MED ORDER — ACETAMINOPHEN 325 MG PO TABS
650.0000 mg | ORAL_TABLET | Freq: Four times a day (QID) | ORAL | Status: DC | PRN
  Administered 2018-11-21: 650 mg via ORAL
  Filled 2018-11-19: qty 2

## 2018-11-19 NOTE — Progress Notes (Signed)
ANTICOAGULATION CONSULT NOTE - Initial Consult  Pharmacy Consult for Heparin Indication: pulmonary embolus  No Known Allergies  Patient Measurements: Height: 5\' 10"  (177.8 cm) Weight: (!) 320 lb (145.2 kg) IBW/kg (Calculated) : 73 Heparin Dosing Weight: 105  Vital Signs: Temp: 97.5 F (36.4 C) (06/21 2158) Temp Source: Oral (06/21 2158) BP: 112/59 (06/21 2315) Pulse Rate: 138 (06/21 2315)  Labs: Recent Labs    11/18/18 2224  HGB 19.2*  HCT 53.1*  PLT 246  CREATININE 1.47*  TROPONINI 0.46*    Estimated Creatinine Clearance: 93.4 mL/min (A) (by C-G formula based on SCr of 1.47 mg/dL (H)).   Medical History: Past Medical History:  Diagnosis Date  . Sleep apnea     Medications:  No current facility-administered medications on file prior to encounter.    Current Outpatient Medications on File Prior to Encounter  Medication Sig Dispense Refill  . acetaminophen (TYLENOL) 325 MG tablet Take 2 tablets (650 mg total) by mouth every 6 (six) hours as needed for mild pain (or Fever >/= 101). (Patient not taking: Reported on 01/04/2016) 30 tablet 0  . cephALEXin (KEFLEX) 500 MG capsule Take 1 capsule (500 mg total) by mouth 2 (two) times daily. (Patient not taking: Reported on 01/04/2016) 14 capsule 0    Assessment: 43 y.o. male with large bilateral PE for heparin   Goal of Therapy:  Heparin level 0.3-0.7 units/ml Monitor platelets by anticoagulation protocol: Yes   Plan:  Heparin 6000 units IV bolus, then start heparin 1900 units/hr Check heparin level in 6 hours.    Caryl Pina 11/19/2018,12:42 AM

## 2018-11-19 NOTE — Consult Note (Signed)
Chief Complaint: Patient was seen in consultation today for possible PE thrombolysis Chief Complaint  Patient presents with   Shortness of Breath   at the request of Dr Blake DivineAkula   Supervising Physician: Oley BalmHassell, Armas  [facility]  History of Present Illness: Richard Osborn is a 43 y.o. male   Pt with chronic right leg swelling since cellulitis few yrs ago. 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 The presence of right heart strain has been associated with an increased risk of morbidity and mortality. Please activate Code PE by paging [phone].  Echo: 1. The left ventricle has normal systolic function, with an ejection fraction of 55-60%. The cavity size was normal. There is severely increased left ventricular wall thickness. Left ventricular diastolic Doppler parameters are consistent with impaired  relaxation.  2. The right ventricle has severely reduced systolic function. The cavity was mildly enlarged.  3. The mitral valve is grossly normal.  4. The aortic valve was not well visualized. Aortic valve regurgitation was not assessed by color flow Doppler.  5. The inferior vena cava was dilated in size with <50% respiratory variability.  6. Extremely limited; definity used; normal LV systolic function; probable mild RAE and RVE with severe RV dysfunction; no TR to estimate pulmonary pressure.  Doppler: Summary: Right: Findings consistent with acute  deep vein thrombosis involving the right popliteal vein. Left: There is no evidence of deep vein thrombosis in the lower extremity.  EKG:  ekg abnormal, appears sinus tach at this time Date/Time:                  Sunday November 18 2018 23:16:26 EDT Ventricular Rate:         137 PR Interval:                   QRS Duration: 93 QT Interval:                 315 QTC Calculation:        476 R Axis:                         14 7 Text Interpretation:       Sinus or ectopic atrial tachycardia Left posterior fascicular block Probable anteroseptal infarct, recent T wave inversion Abnormal ekg Confirmed by Zadie RhineWickline, Donald    Request made for possible PE thrombolysis Will discuss with Dr Deanne CofferHassell  Past Medical History:  Diagnosis Date   Sleep apnea     Past Surgical History:  Procedure Laterality Date   NASAL SEPTUM SURGERY     ORIF ULNAR / RADIAL SHAFT FRACTURE      Allergies: Patient has no known allergies.  Medications: Prior to Admission medications   Medication Sig Start Date End Date Taking? Authorizing Provider  CHANTIX 1 MG tablet Take 1 mg by mouth 2 (two) times a day. 10/04/18  Yes [provider]  testosterone cypionate (  DEPOTESTOSTERONE CYPIONATE) 200 MG/ML injection Inject 1 mL into the muscle See admin instructions. Every 10 days 07/18/18  Yes [provider]  cephALEXin (KEFLEX) 500 MG capsule Take 1 capsule (500 mg total) by mouth 2 (two) times daily. Patient not taking: Reported on 01/04/2016 07/09/15   Penny Pia, MD     Family History  Problem Relation Age of Onset   Diabetes Other        grandmother    Social History   Socioeconomic History   Marital status: Divorced    Spouse name: Not on file   Number of children: Y   Years of education: Not on file   Highest education level: Not on file  Occupational History   Occupation: bus driver    Comment: for [employer]  Ecologist strain: Not on file   Food  insecurity    Worry: Not on file    Inability: Not on file   Transportation needs    Medical: Not on file    Non-medical: Not on file  Tobacco Use   Smoking status: Former Smoker    Packs/day: 1.00    Years: 20.00    Pack years: 20.00    Types: Cigarettes   Smokeless tobacco: Never Used  Substance and Sexual Activity   Alcohol use: Yes    Alcohol/week: 0.0 standard drinks    Comment: occassionally   Drug use: No   Sexual activity: Not on file  Lifestyle   Physical activity    Days per week: Not on file    Minutes per session: Not on file   Stress: Not on file  Relationships   Social connections    Talks on phone: Not on file    Gets together: Not on file    Attends religious service: Not on file    Active member of club or organization: Not on file    Attends meetings of clubs or organizations: Not on file    Relationship status: Not on file  Other Topics Concern   Not on file  Social History Narrative   Not on file    Review of Systems: A 12 point ROS discussed and pertinent positives are indicated in the HPI above.  All other systems are negative.  Review of Systems  Constitutional: Positive for activity change. Negative for fatigue and fever.  Respiratory: Positive for shortness of breath. Negative for cough, choking and chest tightness.   Gastrointestinal: Negative for abdominal pain.  Musculoskeletal: Negative for back pain and gait problem.  Neurological: Negative for weakness.  Psychiatric/Behavioral: Negative for behavioral problems and confusion.    Vital Signs: BP 115/64    Pulse (!) 110    Temp (!) 97.5 F (36.4 C) (Oral)    Resp (!) 24    Ht  (1.778 m)    Wt (!) 319 lb 10.7 oz (145 kg)    SpO2 97%    BMI 45.87 kg/m   Physical Exam Vitals signs reviewed.  Constitutional:      Appearance: He is obese.     Comments: In NAD  HENT:     Head: Atraumatic.  Eyes:     Extraocular Movements: Extraocular movements intact.    Cardiovascular:     Rate and Rhythm: Regular rhythm. Tachycardia present.  Pulmonary:     Effort: Pulmonary effort is normal.     Breath sounds: Normal breath sounds. No decreased breath sounds or wheezing.  Abdominal:  General: Bowel sounds are normal.     Palpations: Abdomen is soft.  Musculoskeletal: Normal range of motion.     Right lower leg: Edema present.     Left lower leg: No edema.  Skin:    General: Skin is warm and dry.  Neurological:     Mental Status: He is alert and oriented to person, place, and time.  Psychiatric:        Behavior: Behavior normal.     Imaging: Ct Angio Chest Pe W/cm &/or Wo Cm  Result Date: 11/19/2018 CLINICAL DATA:  43 year old male with shortness of breath. History of sleep. EXAM: CT ANGIOGRAPHY CHEST WITH CONTRAST TECHNIQUE: Multidetector CT imaging of the chest was performed using the standard protocol during bolus administration of intravenous contrast. Multiplanar CT image reconstructions and MIPs were obtained to evaluate the vascular anatomy. CONTRAST:  75mL OMNIPAQUE IOHEXOL 350 MG/ML SOLN COMPARISON:  Chest radiograph dated 11/18/2018 FINDINGS: Cardiovascular: There is no cardiomegaly or pericardial effusion. There is dilatation of the right ventricle measuring approximately 5.2 mm in diameter. The left ventricle measures approximately 2.3 mm in diameter. There is flattening of the interventricular septum. The RV/LV ratio is 2.3. There is retrograde flow of contrast from the right atrium into the IVC indicative of right heart dysfunction. The thoracic aorta is unremarkable for the degree of opacification. Large bilateral pulmonary artery emboli extending from the central pulmonary arteries into the lobar, segmental, and subsegmental branches of the upper and lower lobes bilaterally. Mediastinum/Nodes: There is no hilar or mediastinal adenopathy. The esophagus is grossly unremarkable. No mediastinal fluid collection or hematoma. Lungs/Pleura: The  lungs are clear. There is no pleural effusion or pneumothorax. The central airways are patent. Upper Abdomen: No acute abnormality. Musculoskeletal: Mild degenerative changes. Review of the MIP images confirms the above findings. IMPRESSION: Large bilateral pulmonary artery emboli with CT evidence of right heart strain (RV/LV Ratio = 2.3) consistent with at least submassive (intermediate risk) PE. The presence of right heart strain has been associated with an increased risk of morbidity and mortality. Please activate Code PE by paging [phone]. These results were called by telephone at the time of interpretation on 11/19/2018 at 12:10 am to Dr. Bebe ShaggyWickline, who verbally acknowledged these results. Electronically Signed   By: Elgie CollardArash  Radparvar M.D.   On: 11/19/2018 00:18   Dg Chest Portable 1 View  Result Date: 11/18/2018 CLINICAL DATA:  43 year old male which shortness of breath. EXAM: PORTABLE CHEST 1 VIEW COMPARISON:  None. FINDINGS: There is mild cardiomegaly. There is mild prominence of the central and hilar vasculature. No focal consolidation, pleural effusion, or pneumothorax. No acute osseous pathology. IMPRESSION: Mild cardiomegaly with possible mild vascular congestion. No focal consolidation. Electronically Signed   By: Elgie CollardArash  Radparvar M.D.   On: 11/18/2018 22:57   Vas [place] Lower Extremity Venous (dvt)  Result Date: 11/19/2018  Lower Venous Study Indications: Pulmonary embolism.  Risk Factors: Confirmed PE. Comparison Study: Prior negative LLE duplex from 04/12/14, is available Performing Technologist: Sherren Kernsandace Kanady RVS  Examination Guidelines: A complete evaluation includes B-mode imaging, spectral Doppler, color Doppler, and power Doppler as needed of all accessible portions of each vessel. Bilateral testing is considered an integral part of a complete examination. Limited examinations for reoccurring indications may be performed as noted.   +---------+---------------+---------+-----------+----------+-------+  RIGHT     Compressibility Phasicity Spontaneity Properties Summary  +---------+---------------+---------+-----------+----------+-------+  CFV       Full            Yes  Yes                             +---------+---------------+---------+-----------+----------+-------+  SFJ       Full                                                      +---------+---------------+---------+-----------+----------+-------+  FV Prox   Full                                                      +---------+---------------+---------+-----------+----------+-------+  FV Mid    Full                                                      +---------+---------------+---------+-----------+----------+-------+  FV Distal Full                                                      +---------+---------------+---------+-----------+----------+-------+  PFV       Full                                                      +---------+---------------+---------+-----------+----------+-------+  POP       None            No        No                     Acute    +---------+---------------+---------+-----------+----------+-------+  PTV       Full                                                      +---------+---------------+---------+-----------+----------+-------+  PERO      Full                                                      +---------+---------------+---------+-----------+----------+-------+   +---------+---------------+---------+-----------+----------+-------+  LEFT      Compressibility Phasicity Spontaneity Properties Summary  +---------+---------------+---------+-----------+----------+-------+  CFV       Full            Yes       Yes                             +---------+---------------+---------+-----------+----------+-------+  SFJ       Full                                                      +---------+---------------+---------+-----------+----------+-------+  FV Prox   Full                                                       +---------+---------------+---------+-----------+----------+-------+  FV Mid    Full                                                      +---------+---------------+---------+-----------+----------+-------+  FV Distal Full                                                      +---------+---------------+---------+-----------+----------+-------+  PFV       Full                                                      +---------+---------------+---------+-----------+----------+-------+  POP       Full            Yes       Yes                             +---------+---------------+---------+-----------+----------+-------+  PTV       Full                                                      +---------+---------------+---------+-----------+----------+-------+  PERO      Full                                                      +---------+---------------+---------+-----------+----------+-------+     Summary: Right: Findings consistent with acute deep vein thrombosis involving the right popliteal vein. Left: There is no evidence of deep vein thrombosis in the lower extremity.  *See table(s) above for measurements and observations.    Preliminary     Labs:  CBC: Recent Labs    11/18/18 2224 11/19/18 0203  WBC 18.8* 17.9*  HGB 19.2* 17.3*  HCT 53.1* 49.8  PLT 246 230    COAGS: No results for input(s): INR, APTT in the last 8760 hours.  BMP: Recent Labs    11/18/18 2224 11/19/18 0203  NA 135 136  K 4.9 4.9  CL 104 105  CO2 15* 20*  GLUCOSE 154* 127*  BUN 12 11  CALCIUM 8.7* 8.1*  CREATININE 1.47* 1.46*  GFRNONAA 58* 58*  GFRAA >60 >60    LIVER FUNCTION TESTS: No results for input(s): BILITOT, AST, ALT, ALKPHOS, PROT, ALBUMIN in the last 8760 hours.  TUMOR MARKERS: No results for input(s): AFPTM, CEA, CA199, CHROMGRNA in the last  8760 hours.  Assessment and Plan:  SOB Covid neg Bilat PE with Rt heart strain +doppler Rt DVT; acute  popliteal vein DVT Echo : right ventricle reduced function Feeling better / breathing better on Heparin 96% 2L  Discussed with Dr Molli Knock Not inclined to move ahead with Thrombolysis at this point with pt stable and better in Heparin Can re order if needed. Pt is aware and agreeable   Thank you for this interesting consult.  I greatly enjoyed meeting Richard Osborn and look forward to participating in their care.  A copy of this report was sent to the requesting provider on this date.  Electronically Signed: Robet Leu, PA-C 11/19/2018, 2:22 PM   I spent a total of 40 Minutes    in face to face in clinical consultation, greater than 50% of which was counseling/coordinating care for consideration of PE thrombolysis

## 2018-11-19 NOTE — Progress Notes (Signed)
Richard Osborn is a 43 y.o. male with history of sleep apnea presents to the ER with complaints of worsening shortness of breath over the last 1 week.  Patient states that he has been progressively getting short of breath on exertion and to the point that he is been short of breath even at rest now.  Has chronic right lower extremity swelling.  He ws found to have bilateral PE and right popliteal DVT.   Pt seen and examined and is 4l it of [place] and speaking in full sentences..  Tachycardia improved.  Echo shows severe depression of the systolic function of the right ventricle.  IR consulted to see if he needs thrombolysis of the PE/ IVC filter.   Plan Continue with heparin gtt.  CIWA FOR alcohol withdrawals. None so far.   Hosie Poisson, MD [phone]

## 2018-11-19 NOTE — Progress Notes (Signed)
RT set up CPAP with 2L O2 bled into circuit and placed on patient. Patient tolerating well at this time with no respiratory distress noted. RT will monitor as needed

## 2018-11-19 NOTE — Progress Notes (Signed)
[place] for heparin Indication: pulmonary embolus  No Known Allergies  Patient Measurements: Height: 5\' 10"  (177.8 cm) Weight: (!) 319 lb 10.7 oz (145 kg) IBW/kg (Calculated) : 73 Heparin Dosing Weight: 105  Vital Signs: BP: 105/85 (06/22 1615) Pulse Rate: 109 (06/22 1615)  Labs: Recent Labs    11/18/18 2224 11/19/18 0202 11/19/18 0203 11/19/18 0808 11/19/18 1459  HGB 19.2*  --  17.3*  --   --   HCT 53.1*  --  49.8  --   --   PLT 246  --  230  --   --   HEPARINUNFRC  --   --   --  0.47 0.32  CREATININE 1.47*  --  1.46*  --   --   TROPONINI 0.46* 0.37*  --  0.32* 0.20*    Estimated Creatinine Clearance: 93.9 mL/min (A) (by C-G formula based on SCr of 1.46 mg/dL (H)).  Assessment: 43 y.o. male with large bilateral PE continues on IV heparin. Initial heparin level is therapeutic. CBC is ok with elevated Hgb. No bleeding noted.   6/22 PM Update: Confirmatory heparin level therapeutic at 0.32 on 1900 units/hr. Continue current rate.   Goal of Therapy:  Heparin level 0.3-0.7 units/ml Monitor platelets by anticoagulation protocol: Yes   Plan: Continue heparin gtt 1900 units/hr Daily heparin level and CBC F/u plans for oral anticoagulation  Thank you for involving pharmacy in this patient's care.  Janae Bridgeman, PharmD PGY1 Pharmacy Resident Phone: [phone] 11/19/2018 4:27 PM

## 2018-11-19 NOTE — H&P (Signed)
History and Physical    Richard ReadyDaniel S Wagman WGN:[id number] DOB: 05/22/1976 DOA: 11/18/2018  PCP: Salli QuarryZimmermann, Matt, PA-C  Patient coming from: Home.  Chief Complaint: Shortness of breath.  HPI: Richard Osborn is a 43 y.o. male with history of sleep apnea presents to the ER with complaints of worsening shortness of breath over the last 1 week.  Patient states that he has been progressively getting short of breath on exertion and to the point that he is been short of breath even at rest now.  Has chronic right lower extremity swelling.  Denies chest pain has been having some dizziness on standing.  Has not had any fever chills productive cough.  ED Course: In the ER patient was tachycardic blood pressures in the low normals.  EKG shows sinus tachycardia with nonspecific T wave changes anterolateral leads.  Creatinine 1.4 glucose 154 troponin I 0.46 BNP 270 and CT angiogram of the chest done shows bilateral pulmonary embolism with strain pattern.  Pulmonary critical care was consulted and at this time patient is being admitted for further management patient is being started on heparin for PE.  Review of Systems: As per HPI, rest all negative.   Past Medical History:  Diagnosis Date  . Sleep apnea     Past Surgical History:  Procedure Laterality Date  . NASAL SEPTUM SURGERY    . ORIF ULNAR / RADIAL SHAFT FRACTURE       reports that he has quit smoking. His smoking use included cigarettes. He has a 20.00 pack-year smoking history. He has never used smokeless tobacco. He reports current alcohol use. He reports that he does not use drugs.  No Known Allergies  Family History  Problem Relation Age of Onset  . Diabetes Other        grandmother    Prior to Admission medications   Medication Sig Start Date End Date Taking? Authorizing Provider  acetaminophen (TYLENOL) 325 MG tablet Take 2 tablets (650 mg total) by mouth every 6 (six) hours as needed for mild pain (or Fever >/= 101). Patient  not taking: Reported on 01/04/2016 06/29/15   Alison Murrayevine, Alma M, MD  cephALEXin (KEFLEX) 500 MG capsule Take 1 capsule (500 mg total) by mouth 2 (two) times daily. Patient not taking: Reported on 01/04/2016 07/09/15   Penny PiaVega, [place], MD    Physical Exam: Vitals:   11/19/18 0045 11/19/18 0051 11/19/18 0110 11/19/18 0115  BP:   110/72 107/76  Pulse: (!) 128 (!) 129 (!) 124 (!) 122  Resp: (!) 23 (!) 26 20 (!) 22  Temp:      TempSrc:      SpO2: 95% 96% 96% 96%  Weight:      Height:          Constitutional: Moderately built and nourished. Vitals:   11/19/18 0045 11/19/18 0051 11/19/18 0110 11/19/18 0115  BP:   110/72 107/76  Pulse: (!) 128 (!) 129 (!) 124 (!) 122  Resp: (!) 23 (!) 26 20 (!) 22  Temp:      TempSrc:      SpO2: 95% 96% 96% 96%  Weight:      Height:       Eyes: Anicteric no pallor. ENMT: No discharge from the ears eyes nose and mouth. Neck: No mass or.  No neck rigidity. Respiratory: No rhonchi or crepitations. Cardiovascular: S1-S2 heard. Abdomen: Soft nontender bowel sounds present. Musculoskeletal: Bilateral lower extremity more on the right side. Skin: No rash. Neurologic: Alert awake oriented  to time place and person.  Moves all extremities. Psychiatric: Appears normal per normal affect.   Labs on Admission: I have personally reviewed following labs and imaging studies  CBC: Recent Labs  Lab 11/18/18 2224  WBC 18.8*  HGB 19.2*  HCT 53.1*  MCV 117.5*  PLT 353   Basic Metabolic Panel: Recent Labs  Lab 11/18/18 2224  NA 135  K 4.9  CL 104  CO2 15*  GLUCOSE 154*  BUN 12  CREATININE 1.47*  CALCIUM 8.7*   GFR: Estimated Creatinine Clearance: 93.3 mL/min (A) (by C-G formula based on SCr of 1.47 mg/dL (H)). Liver Function Tests: No results for input(s): AST, ALT, ALKPHOS, BILITOT, PROT, ALBUMIN in the last 168 hours. No results for input(s): LIPASE, AMYLASE in the last 168 hours. No results for input(s): AMMONIA in the last 168 hours. Coagulation  Profile: No results for input(s): INR, PROTIME in the last 168 hours. Cardiac Enzymes: Recent Labs  Lab 11/18/18 2224  TROPONINI 0.46*   BNP (last 3 results) No results for input(s): PROBNP in the last 8760 hours. HbA1C: No results for input(s): HGBA1C in the last 72 hours. CBG: No results for input(s): GLUCAP in the last 168 hours. Lipid Profile: No results for input(s): CHOL, HDL, LDLCALC, TRIG, CHOLHDL, LDLDIRECT in the last 72 hours. Thyroid Function Tests: No results for input(s): TSH, T4TOTAL, FREET4, T3FREE, THYROIDAB in the last 72 hours. Anemia Panel: No results for input(s): VITAMINB12, FOLATE, FERRITIN, TIBC, IRON, RETICCTPCT in the last 72 hours. Urine analysis:    Component Value Date/Time   COLORURINE AMBER (A) 11/22/2016 0542   APPEARANCEUR CLEAR 11/22/2016 0542   LABSPEC 1.024 11/22/2016 0542   PHURINE 5.0 11/22/2016 0542   GLUCOSEU NEGATIVE 11/22/2016 0542   HGBUR NEGATIVE 11/22/2016 0542   BILIRUBINUR SMALL (A) 11/22/2016 0542   KETONESUR 5 (A) 11/22/2016 0542   PROTEINUR NEGATIVE 11/22/2016 0542   NITRITE NEGATIVE 11/22/2016 0542   LEUKOCYTESUR NEGATIVE 11/22/2016 0542   Sepsis Labs: @LABRCNTIP (procalcitonin:4,lacticidven:4) )No results found for this or any previous visit (from the past 240 hour(s)).   Radiological Exams on Admission: Ct Angio Chest Pe W/cm &/or Wo Cm  Result Date: 11/19/2018 CLINICAL DATA:  43 year old male with shortness of breath. History of sleep. EXAM: CT ANGIOGRAPHY CHEST WITH CONTRAST TECHNIQUE: Multidetector CT imaging of the chest was performed using the standard protocol during bolus administration of intravenous contrast. Multiplanar CT image reconstructions and MIPs were obtained to evaluate the vascular anatomy. CONTRAST:  93mL OMNIPAQUE IOHEXOL 350 MG/ML SOLN COMPARISON:  Chest radiograph dated 11/18/2018 FINDINGS: Cardiovascular: There is no cardiomegaly or pericardial effusion. There is dilatation of the right ventricle  measuring approximately 5.2 mm in diameter. The left ventricle measures approximately 2.3 mm in diameter. There is flattening of the interventricular septum. The RV/LV ratio is 2.3. There is retrograde flow of contrast from the right atrium into the IVC indicative of right heart dysfunction. The thoracic aorta is unremarkable for the degree of opacification. Large bilateral pulmonary artery emboli extending from the central pulmonary arteries into the lobar, segmental, and subsegmental branches of the upper and lower lobes bilaterally. Mediastinum/Nodes: There is no hilar or mediastinal adenopathy. The esophagus is grossly unremarkable. No mediastinal fluid collection or hematoma. Lungs/Pleura: The lungs are clear. There is no pleural effusion or pneumothorax. The central airways are patent. Upper Abdomen: No acute abnormality. Musculoskeletal: Mild degenerative changes. Review of the MIP images confirms the above findings. IMPRESSION: Large bilateral pulmonary artery emboli with CT evidence of right heart  strain (RV/LV Ratio = 2.3) consistent with at least submassive (intermediate risk) PE. The presence of right heart strain has been associated with an increased risk of morbidity and mortality. Please activate Code PE by paging [phone]. These results were called by telephone at the time of interpretation on 11/19/2018 at 12:10 am to Dr. Bebe ShaggyWickline, who verbally acknowledged these results. Electronically Signed   By: Elgie CollardArash  Radparvar M.D.   On: 11/19/2018 00:18   Dg Chest Portable 1 View  Result Date: 11/18/2018 CLINICAL DATA:  43 year old male which shortness of breath. EXAM: PORTABLE CHEST 1 VIEW COMPARISON:  None. FINDINGS: There is mild cardiomegaly. There is mild prominence of the central and hilar vasculature. No focal consolidation, pleural effusion, or pneumothorax. No acute osseous pathology. IMPRESSION: Mild cardiomegaly with possible mild vascular congestion. No focal consolidation. Electronically  Signed   By: Elgie CollardArash  Radparvar M.D.   On: 11/18/2018 22:57    EKG: Independently reviewed.  Sinus tachycardia.  Assessment/Plan Principal Problem:   Bilateral pulmonary embolism (HCC) Active Problems:   OSA (obstructive sleep apnea)   Chronic alcoholism (HCC)   ARF (acute renal failure) (HCC)    1. Acute bilateral pulmonary embolism with strain pattern -appreciate pulmonary consult.  Patient is on heparin.  Check cardiac markers 2D echo Dopplers of the lower extremity.  IR consult patient on 2D echo findings. 2. History of sleep apnea on CPAP. 3. History of chronic alcoholism patient states he has cut down alcohol a lot now only drinks in the weekend.  Closely monitor for any withdrawal. 4. Tobacco abuse quit in January.  Presently on Chantix. 5. Macrocytosis -suspect likely from alcoholism.  Follow CBC may need further work-up as outpatient.   DVT prophylaxis: Heparin. Code Status: Full code. Family Communication: Discussed with patient. Disposition Plan: Home. Consults called: Pulmonary critical care. Admission status: Inpatient.   Eduard ClosArshad N Merrick Maggio MD [facility] Pager [phone].  If 7PM-7AM, please contact night-coverage [email or web address] Password [email or web address]  11/19/2018, 1:24 AM

## 2018-11-19 NOTE — ED Notes (Signed)
Heart healthy lunch tray ordered 

## 2018-11-19 NOTE — Consult Note (Signed)
Name: Richard Osborn MRN: [id number] DOB: 02/25/1976 Date or service: 11/19/18   LOS: 0  [facility] / Critical Care Note   History of Present Illness: 43 yo with hx of obesity, etoh abuse , ex smoker p/w worsening SOB x 12 days , LE swelling, could not take a shower today as he was SOB, arrived in the ED with HR in the 140-150 , CTA chest + bilateral PE. During me evaluation patient is speaking in full sentences, feels well at rest but gets SOB on activity.  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 Denies any fevers, chills , chest pain.    Lines / Drains:PIV   Cultures:N/A   Antibiotics: NONE   Tests / Events: covid19 pending    Past Medical History:  Diagnosis Date  . Sleep apnea     Past Surgical History:  Procedure Laterality Date  . NASAL SEPTUM SURGERY    . ORIF ULNAR / RADIAL SHAFT FRACTURE      Prior to Admission medications   Medication Sig Start Date End Date Taking? Authorizing Provider  acetaminophen (TYLENOL) 325 MG tablet Take 2 tablets (650 mg total) by mouth every 6 (six) hours as needed for mild pain (or Fever >/= 101). Patient not taking: Reported on 01/04/2016 06/29/15   Alison Murrayevine, Alma M, MD  cephALEXin (KEFLEX) 500 MG capsule Take 1 capsule (500 mg total) by mouth 2 (two) times daily. Patient not taking: Reported on 01/04/2016 07/09/15   Penny PiaVega, [place], MD    Allergies No Known Allergies  Family History Family History  Problem Relation Age of Onset  . Diabetes Other        grandmother    Social History  reports that he has quit smoking. His smoking use included cigarettes. He has a 20.00 pack-year smoking history. He has never used smokeless tobacco. He reports current alcohol use. He reports that he does not use drugs.  Review Of Systems:   Vital Signs: Temp:  [97.5 F (36.4 C)] 97.5 F (36.4 C) (06/21 2158) Pulse Rate:  [128-147] 129 (06/22 0051) Resp:  [20-38] 26 (06/22 0051) BP: (99-132)/(59-84)  112/59 (06/21 2315) SpO2:  [94 %-97 %] 96 % (06/22 0051) Weight:  [145 kg-145.2 kg] 145 kg (06/22 0042) No intake/output data recorded.  Physical Examination: General:AAO3 , mild resp distress Neuro: moving all ext and following commands CV: tachy, s1s2 , no m/rg PULM: CTABL GI: BS+ nt nd Extremities: +1 ptting edema in the LE b/l   Ventilator settings: 4 L [place]    Labs    CBC Recent Labs  Lab 11/18/18 2224  HGB 19.2*  HCT 53.1*  WBC 18.8*  PLT 246     BMET Recent Labs  Lab 11/18/18 2224  NA 135  K 4.9  CL 104  CO2 15*  GLUCOSE 154*  BUN 12  CREATININE 1.47*  CALCIUM 8.7*    No results for input(s): INR in the last 168 hours.  No results for input(s): PHART, PCO2ART, PO2ART, HCO3, TCO2, O2SAT in the last 168 hours.  Invalid input(s): PCO2, PO2   Radiology: CTA chest bl submassive PE , some R heart strain.    Assessment and Plan: Principal Problem:   Pulmonary embolism (HCC)  Submassive PE b/l - HR down to 120's , on 4 L [place] speaking in full sentences , stable BP Recommend heparin drip , TTE , IR eval for lytics if depending on TTE findings. IVF  Admit to  PCU  Re-consult ICU if clinical picture changes. COVID19 pending Etoh withdrawal precautions- thiamine, folic acid   Best practices / Disposition: -->Code Status: full -->DVT [id number] drip -->GI Px:n/a -->Diet: per primary team  CC time : 35 minutes  11/19/2018, 1:06 AM

## 2018-11-19 NOTE — Progress Notes (Signed)
  Echocardiogram 2D Echocardiogram has been performed.  Richard Osborn 11/19/2018, 10:48 AM

## 2018-11-19 NOTE — ED Notes (Signed)
Report Given to RN P Pulliam. Pt stable and A&Ox4 at time of transfer of care. Pt w/ no acute complaints, condition stable.

## 2018-11-19 NOTE — ED Notes (Signed)
Pt provided water per his request.  Personal fan ordered for patient from materials management. Inpatient bed ordered for patient comfort while holding in the ED

## 2018-11-19 NOTE — ED Notes (Signed)
ED TO INPATIENT HANDOFF REPORT  ED Nurse Name and Phone #: Idalia Needle, RN [phone]  S Name/Age/Gender [name] 43 y.o. male Room/Bed: 014C/014C  Code Status   Code Status: Full Code  Home/SNF/Other Home Patient oriented to: self, place, time and situation Is this baseline? Yes   Triage Complete: Triage complete  Chief Complaint Shortness OF Breathe Dizziness  Triage Note Pt from home w/ a c/o SOB x1 week without exertion and dizziness that began today. No LOC. Pt denies CP. Reports that he hiked 3 miles about 3 weeks ago, which is more activity than what is normal for him. Additional complaints of a dry cough that began one week ago.    Allergies No Known Allergies  Level of Care/Admitting Diagnosis ED Disposition    ED Disposition Condition Comment   Admit  Hospital Area: [facility] [[id number]]  Level of Care: Progressive [102]  Covid Evaluation: Screening Protocol (No Symptoms)  Diagnosis: Pulmonary embolism (HCC) [241700]  Admitting Physician: Eduard Clos [phone]  Attending Physician: Eduard Clos [phone]  Estimated length of stay: past midnight tomorrow  Certification:: I certify this patient will need inpatient services for at least 2 midnights  PT Class (Do Not Modify): Inpatient [101]  PT Acc Code (Do Not Modify): Private [1]       B Medical/Surgery History Past Medical History:  Diagnosis Date  . Sleep apnea    Past Surgical History:  Procedure Laterality Date  . NASAL SEPTUM SURGERY    . ORIF ULNAR / RADIAL SHAFT FRACTURE       A IV Location/Drains/Wounds Patient Lines/Drains/Airways Status   Active Line/Drains/Airways    Name:   Placement date:   Placement time:   Site:   Days:   Peripheral IV 11/18/18 Right Antecubital   11/18/18    2223    Antecubital   1          Intake/Output Last 24 hours No intake or output data in the 24 hours ending 11/19/18 1626  Labs/Imaging Results for orders placed or performed  during the hospital encounter of 11/18/18 (from the past 48 hour(s))  Basic metabolic panel     Status: Abnormal   Collection Time: 11/18/18 10:24 PM  Result Value Ref Range   Sodium 135 135 - 145 mmol/L   Potassium 4.9 3.5 - 5.1 mmol/L    Comment: SPECIMEN HEMOLYZED. HEMOLYSIS MAY AFFECT INTEGRITY OF RESULTS.   Chloride 104 98 - 111 mmol/L   CO2 15 (L) 22 - 32 mmol/L   Glucose, Bld 154 (H) 70 - 99 mg/dL   BUN 12 6 - 20 mg/dL   Creatinine, Ser 5.62 (H) 0.61 - 1.24 mg/dL   Calcium 8.7 (L) 8.9 - 10.3 mg/dL   GFR calc non Af Amer 58 (L) >60 mL/min   GFR calc Af Amer >60 >60 mL/min   Anion gap 16 (H) 5 - 15    Comment: Performed at [facility], [address] [place], [place] [address]  CBC     Status: Abnormal   Collection Time: 11/18/18 10:24 PM  Result Value Ref Range   WBC 18.8 (H) 4.0 - 10.5 K/uL   RBC 4.52 4.22 - 5.81 MIL/uL   Hemoglobin 19.2 (H) 13.0 - 17.0 g/dL   HCT 57.8 (H) 46.9 - 62.9 %   MCV 117.5 (H) 80.0 - 100.0 fL   MCH 42.5 (H) 26.0 - 34.0 pg   MCHC 36.2 (H) 30.0 - 36.0 g/dL  RDW 13.7 11.5 - 15.5 %   Platelets 246 150 - 400 K/uL   nRBC 0.4 (H) 0.0 - 0.2 %    Comment: Performed at [facility], [address], [place], [place] [address]  Troponin I - ONCE - STAT     Status: Abnormal   Collection Time: 11/18/18 10:24 PM  Result Value Ref Range   Troponin I 0.46 (HH) <0.03 ng/mL    Comment: CRITICAL RESULT CALLED TO, READ BACK BY AND VERIFIED WITH: Mare Loan 11/18/18 2322 WAYK Performed at [facility], [address], [place], [place] [address]   Brain natriuretic peptide     Status: Abnormal   Collection Time: 11/18/18 11:00 PM  Result Value Ref Range   B Natriuretic Peptide 270.3 (H) 0.0 - 100.0 pg/mL    Comment: Performed at [facility], [address] [place], [place] [address]  Salicylate level     Status: None   Collection Time: 11/18/18 11:25 PM  Result Value Ref Range   Salicylate Lvl <7.0 2.8 - 30.0 mg/dL    Comment:  Performed at [facility], [address], [place], [place] [address]  Acetaminophen level     Status: Abnormal   Collection Time: 11/18/18 11:25 PM  Result Value Ref Range   Acetaminophen (Tylenol), Serum <10 (L) 10 - 30 ug/mL    Comment: (NOTE) Therapeutic concentrations vary significantly. A range of 10-30 ug/mL  may be an effective concentration for many patients. However, some  are best treated at concentrations outside of this range. Acetaminophen concentrations >150 ug/mL at 4 hours after ingestion  and >50 ug/mL at 12 hours after ingestion are often associated with  toxic reactions. Performed at [facility], [address], [place], [place] [address]   SARS Coronavirus 2 (CEPHEID - Performed in [facility] hospital lab), Hosp Order     Status: None   Collection Time: 11/19/18 12:36 AM   Specimen: Nasopharyngeal Swab  Result Value Ref Range   SARS Coronavirus 2 NEGATIVE NEGATIVE    Comment: (NOTE) If result is NEGATIVE SARS-CoV-2 target nucleic acids are NOT DETECTED. The SARS-CoV-2 RNA is generally detectable in upper and lower  respiratory specimens during the acute phase of infection. The lowest  concentration of SARS-CoV-2 viral copies this assay can detect is 250  copies / mL. A negative result does not preclude SARS-CoV-2 infection  and should not be used as the sole basis for treatment or other  patient management decisions.  A negative result may occur with  improper specimen collection / handling, submission of specimen other  than nasopharyngeal swab, presence of viral mutation(s) within the  areas targeted by this assay, and inadequate number of viral copies  (<250 copies / mL). A negative result must be combined with clinical  observations, patient history, and epidemiological information. If result is POSITIVE SARS-CoV-2 target nucleic acids are DETECTED. The SARS-CoV-2 RNA is generally detectable in upper and lower  respiratory specimens dur ing  the acute phase of infection.  Positive  results are indicative of active infection with SARS-CoV-2.  Clinical  correlation with patient history and other diagnostic information is  necessary to determine patient infection status.  Positive results do  not rule out bacterial infection or co-infection with other viruses. If result is PRESUMPTIVE POSTIVE SARS-CoV-2 nucleic acids MAY BE PRESENT.   A presumptive positive result was obtained on the submitted specimen  and confirmed on repeat testing.  While 2019  novel coronavirus  (SARS-CoV-2) nucleic acids may be present in the submitted sample  additional confirmatory testing may be necessary for epidemiological  and / or clinical management purposes  to differentiate between  SARS-CoV-2 and other Sarbecovirus currently known to infect humans.  If clinically indicated additional testing with an alternate test  methodology [phone]([id number]) is advised. The SARS-CoV-2 RNA is generally  detectable in upper and lower respiratory sp ecimens during the acute  phase of infection. The expected result is Negative. Fact Sheet for Patients:  BoilerBrush.com.cyhttps://www.fda.gov/media/136312/download Fact Sheet for Healthcare Providers: https://pope.com/https://www.fda.gov/media/136313/download This test is not yet approved or cleared by the [place] FDA and has been authorized for detection and/or diagnosis of SARS-CoV-2 by FDA under an Emergency Use Authorization (EUA).  This EUA will remain in effect (meaning this test can be used) for the duration of the COVID-19 declaration under Section 564(b)(1) of the Act, 21 U.S.C. section 360bbb-3(b)(1), unless the authorization is terminated or revoked sooner. Performed at [facility], [address] [place], [place] [address]   HIV antibody (Routine Testing)     Status: None   Collection Time: 11/19/18  2:02 AM  Result Value Ref Range   HIV Screen 4th Generation wRfx Non Reactive Non Reactive    Comment: (NOTE) Performed At: [facility] [address] [place], [place] [id number] Jolene SchimkeNagendra Sanjai MD WS:[id number]:[phone]   TSH     Status: None   Collection Time: 11/19/18  2:02 AM  Result Value Ref Range   TSH 2.463 0.350 - 4.500 uIU/mL    Comment: Performed by a 3rd Generation assay with a functional sensitivity of <=0.01 uIU/mL. Performed at [facility], [address] [place], [place] [address]   Troponin I - Now Then Q6H     Status: Abnormal   Collection Time: 11/19/18  2:02 AM  Result Value Ref Range   Troponin I 0.37 (HH) <0.03 ng/mL    Comment: CRITICAL VALUE NOTED.  VALUE IS CONSISTENT WITH PREVIOUSLY REPORTED AND CALLED VALUE. Performed at [facility], [address] [place], [place] [address]   Basic metabolic panel     Status: Abnormal   Collection Time: 11/19/18  2:03 AM  Result Value Ref Range   Sodium 136 135 - 145 mmol/L   Potassium 4.9 3.5 - 5.1 mmol/L   Chloride 105 98 - 111 mmol/L   CO2 20 (L) 22 - 32 mmol/L   Glucose, Bld 127 (H) 70 - 99 mg/dL   BUN 11 6 - 20 mg/dL   Creatinine, Ser 5.911.46 (H) 0.61 - 1.24 mg/dL   Calcium 8.1 (L) 8.9 - 10.3 mg/dL   GFR calc non Af Amer 58 (L) >60 mL/min   GFR calc Af Amer >60 >60 mL/min   Anion gap 11 5 - 15    Comment: Performed at [facility], [address] [place], [place] [address]  CBC     Status: Abnormal   Collection Time: 11/19/18  2:03 AM  Result Value Ref Range   WBC 17.9 (H) 4.0 - 10.5 K/uL   RBC 4.17 (L) 4.22 - 5.81 MIL/uL   Hemoglobin 17.3 (H) 13.0 - 17.0 g/dL   HCT 65.949.8 93.539.0 - 70.152.0 %   MCV 119.4 (H) 80.0 - 100.0 fL   MCH 41.5 (H) 26.0 - 34.0 pg   MCHC 34.7 30.0 - 36.0 g/dL   RDW 77.913.9 39.011.5 - 30.015.5 %   Platelets 230 150 - 400 K/uL   nRBC  0.4 (H) 0.0 - 0.2 %    Comment: Performed at [facility], [address] [place], [place] [address]  Heparin level (unfractionated)     Status: None   Collection Time: 11/19/18  8:08 AM  Result Value Ref Range   Heparin Unfractionated 0.47 0.30 - 0.70 IU/mL     Comment: (NOTE) If heparin results are below expected values, and patient dosage has  been confirmed, suggest follow up testing of antithrombin III levels. Performed at [facility], [address] [place], [place] [address]   Troponin I - Now Then Q6H     Status: Abnormal   Collection Time: 11/19/18  8:08 AM  Result Value Ref Range   Troponin I 0.32 (HH) <0.03 ng/mL    Comment: CRITICAL VALUE NOTED.  VALUE IS CONSISTENT WITH PREVIOUSLY REPORTED AND CALLED VALUE. Performed at [facility], [address] [place], [place] [address]   Troponin I - Now Then Q6H     Status: Abnormal   Collection Time: 11/19/18  2:59 PM  Result Value Ref Range   Troponin I 0.20 (HH) <0.03 ng/mL    Comment: CRITICAL VALUE NOTED.  VALUE IS CONSISTENT WITH PREVIOUSLY REPORTED AND CALLED VALUE. Performed at [facility], [address] [place], [place] [address]   Heparin level (unfractionated)     Status: None   Collection Time: 11/19/18  2:59 PM  Result Value Ref Range   Heparin Unfractionated 0.32 0.30 - 0.70 IU/mL    Comment: (NOTE) If heparin results are below expected values, and patient dosage has  been confirmed, suggest follow up testing of antithrombin III levels. Performed at [facility], [address] [place], [place] [address]    Ct Angio Chest Pe W/cm &/or Wo Cm  Result Date: 11/19/2018 CLINICAL DATA:  43 year old male with shortness of breath. History of sleep. EXAM: CT ANGIOGRAPHY CHEST WITH CONTRAST TECHNIQUE: Multidetector CT imaging of the chest was performed using the standard protocol during bolus administration of intravenous contrast. Multiplanar CT image reconstructions and MIPs were obtained to evaluate the vascular anatomy. CONTRAST:  75mL OMNIPAQUE IOHEXOL 350 MG/ML SOLN COMPARISON:  Chest radiograph dated 11/18/2018 FINDINGS: Cardiovascular: There is no cardiomegaly or pericardial effusion. There is dilatation of the right ventricle measuring  approximately 5.2 mm in diameter. The left ventricle measures approximately 2.3 mm in diameter. There is flattening of the interventricular septum. The RV/LV ratio is 2.3. There is retrograde flow of contrast from the right atrium into the IVC indicative of right heart dysfunction. The thoracic aorta is unremarkable for the degree of opacification. Large bilateral pulmonary artery emboli extending from the central pulmonary arteries into the lobar, segmental, and subsegmental branches of the upper and lower lobes bilaterally. Mediastinum/Nodes: There is no hilar or mediastinal adenopathy. The esophagus is grossly unremarkable. No mediastinal fluid collection or hematoma. Lungs/Pleura: The lungs are clear. There is no pleural effusion or pneumothorax. The central airways are patent. Upper Abdomen: No acute abnormality. Musculoskeletal: Mild degenerative changes. Review of the MIP images confirms the above findings. IMPRESSION: Large bilateral pulmonary artery emboli with CT evidence of right heart strain (RV/LV Ratio = 2.3) consistent with at least submassive (intermediate risk) PE. The presence of right heart strain has been associated with an increased risk of morbidity and mortality. Please activate Code PE by paging [phone]. These results were called by telephone at the time of interpretation on 11/19/2018 at 12:10 am to Dr. Bebe ShaggyWickline, who verbally acknowledged  these results. Electronically Signed   By: Elgie CollardArash  Radparvar M.D.   On: 11/19/2018 00:18   Dg Chest Portable 1 View  Result Date: 11/18/2018 CLINICAL DATA:  43 year old male which shortness of breath. EXAM: PORTABLE CHEST 1 VIEW COMPARISON:  None. FINDINGS: There is mild cardiomegaly. There is mild prominence of the central and hilar vasculature. No focal consolidation, pleural effusion, or pneumothorax. No acute osseous pathology. IMPRESSION: Mild cardiomegaly with possible mild vascular congestion. No focal consolidation. Electronically Signed    By: Elgie CollardArash  Radparvar M.D.   On: 11/18/2018 22:57   Vas [place] Lower Extremity Venous (dvt)  Result Date: 11/19/2018  Lower Venous Study Indications: Pulmonary embolism.  Risk Factors: Confirmed PE. Comparison Study: Prior negative LLE duplex from 04/12/14, is available Performing Technologist: Sherren Kernsandace Kanady RVS  Examination Guidelines: A complete evaluation includes B-mode imaging, spectral Doppler, color Doppler, and power Doppler as needed of all accessible portions of each vessel. Bilateral testing is considered an integral part of a complete examination. Limited examinations for reoccurring indications may be performed as noted.  +---------+---------------+---------+-----------+----------+-------+ RIGHT    CompressibilityPhasicitySpontaneityPropertiesSummary +---------+---------------+---------+-----------+----------+-------+ CFV      Full           Yes      Yes                          +---------+---------------+---------+-----------+----------+-------+ SFJ      Full                                                 +---------+---------------+---------+-----------+----------+-------+ FV Prox  Full                                                 +---------+---------------+---------+-----------+----------+-------+ FV Mid   Full                                                 +---------+---------------+---------+-----------+----------+-------+ FV DistalFull                                                 +---------+---------------+---------+-----------+----------+-------+ PFV      Full                                                 +---------+---------------+---------+-----------+----------+-------+ POP      None           No       No                   Acute   +---------+---------------+---------+-----------+----------+-------+ PTV      Full                                                  +---------+---------------+---------+-----------+----------+-------+  PERO     Full                                                 +---------+---------------+---------+-----------+----------+-------+   +---------+---------------+---------+-----------+----------+-------+ LEFT     CompressibilityPhasicitySpontaneityPropertiesSummary +---------+---------------+---------+-----------+----------+-------+ CFV      Full           Yes      Yes                          +---------+---------------+---------+-----------+----------+-------+ SFJ      Full                                                 +---------+---------------+---------+-----------+----------+-------+ FV Prox  Full                                                 +---------+---------------+---------+-----------+----------+-------+ FV Mid   Full                                                 +---------+---------------+---------+-----------+----------+-------+ FV DistalFull                                                 +---------+---------------+---------+-----------+----------+-------+ PFV      Full                                                 +---------+---------------+---------+-----------+----------+-------+ POP      Full           Yes      Yes                          +---------+---------------+---------+-----------+----------+-------+ PTV      Full                                                 +---------+---------------+---------+-----------+----------+-------+ PERO     Full                                                 +---------+---------------+---------+-----------+----------+-------+     Summary: Right: Findings consistent with acute deep vein thrombosis involving the right popliteal vein. Left: There is no evidence of deep vein thrombosis in the lower extremity.  *See table(s) above for measurements and observations.    Preliminary     Pending Labs [employer]  (From admission,  onward)    Start     Ordered   11/20/18 0500  CBC  Daily,   R     11/19/18 0044   11/19/18 0800  Heparin level (unfractionated)  Daily,   R     11/19/18 0044          Vitals/Pain Today's Vitals   11/19/18 1530 11/19/18 1545 11/19/18 1600 11/19/18 1615  BP:  106/88 102/84 105/85  Pulse: (!) 107 95 (!) 101 (!) 109  Resp: (!) 24 19 (!) 21 (!) 24  Temp:      TempSrc:      SpO2: 95% 96% 95% 95%  Weight:      Height:      PainSc:        Isolation Precautions No active isolations  Medications Medications  heparin ADULT infusion 100 units/mL (25000 units/258mL sodium chloride 0.45%) (1,900 Units/hr Intravenous New Bag/Given 11/19/18 1110)  LORazepam (ATIVAN) tablet 1 mg (has no administration in time range)    Or  LORazepam (ATIVAN) injection 1 mg (has no administration in time range)  thiamine (VITAMIN B-1) tablet 100 mg (100 mg Oral Given 11/19/18 0925)    Or  thiamine (B-1) injection 100 mg ( Intravenous See Alternative 11/19/18 0925)  folic acid (FOLVITE) tablet 1 mg (1 mg Oral Given 11/19/18 0925)  multivitamin with minerals tablet 1 tablet (1 tablet Oral Given 11/19/18 0925)  acetaminophen (TYLENOL) tablet 650 mg (has no administration in time range)    Or  acetaminophen (TYLENOL) suppository 650 mg (has no administration in time range)  ondansetron (ZOFRAN) tablet 4 mg (has no administration in time range)    Or  ondansetron (ZOFRAN) injection 4 mg (has no administration in time range)  LORazepam (ATIVAN) tablet 0-4 mg (0 mg Oral Not Given 11/19/18 1441)    Followed by  LORazepam (ATIVAN) tablet 0-4 mg (has no administration in time range)  0.9 %  sodium chloride infusion ( Intravenous New Bag/Given 11/19/18 0713)  perflutren lipid microspheres (DEFINITY) IV suspension (4 mLs Intravenous Given 11/19/18 1032)  sodium chloride flush (NS) 0.9 % injection 3 mL (3 mLs Intravenous Given 11/19/18 0116)  sodium chloride 0.9 % bolus 500 mL (0 mLs Intravenous Stopped  11/19/18 0147)  iohexol (OMNIPAQUE) 350 MG/ML injection 75 mL (75 mLs Intravenous Contrast Given 11/18/18 2347)  heparin bolus via infusion 6,000 Units (6,000 Units Intravenous Bolus from Bag 11/19/18 0114)    Mobility walks Low fall risk   Focused Assessments Pulmonary Assessment Handoff:  Lung sounds: Bilateral Breath Sounds: Clear L Breath Sounds: Clear R Breath Sounds: Clear O2 Device: Room Air        R Recommendations: See Admitting Provider Note  Report given to: 6E  Additional Notes:

## 2018-11-19 NOTE — Progress Notes (Signed)
[place] for Heparin Indication: pulmonary embolus  No Known Allergies  Patient Measurements: Height: 5\' 10"  (177.8 cm) Weight: (!) 319 lb 10.7 oz (145 kg) IBW/kg (Calculated) : 73 Heparin Dosing Weight: 105  Vital Signs: Temp: 97.5 F (36.4 C) (06/21 2158) Temp Source: Oral (06/21 2158) BP: 124/76 (06/22 0710) Pulse Rate: 118 (06/22 0710)  Labs: Recent Labs    11/18/18 2224 11/19/18 0202 11/19/18 0203 11/19/18 0808  HGB 19.2*  --  17.3*  --   HCT 53.1*  --  49.8  --   PLT 246  --  230  --   HEPARINUNFRC  --   --   --  0.47  CREATININE 1.47*  --  1.46*  --   TROPONINI 0.46* 0.37*  --   --     Estimated Creatinine Clearance: 93.9 mL/min (A) (by C-G formula based on SCr of 1.46 mg/dL (H)).  Assessment: 43 y.o. male with large bilateral PE continues on IV heparin. Initial heparin level is therapeutic. CBC is ok with elevated Hgb. No bleeding noted.    Goal of Therapy:  Heparin level 0.3-0.7 units/ml Monitor platelets by anticoagulation protocol: Yes   Plan: Continue heparin gtt 1900 units/hr Will confirm dosing with an afternoon level Daily heparin level and CBC F/u plans for oral anticoagulation  Nyiesha Beever, Rande Lawman 11/19/2018,9:03 AM

## 2018-11-19 NOTE — Progress Notes (Addendum)
VASCULAR LAB PRELIMINARY  PRELIMINARY  PRELIMINARY  PRELIMINARY  Bilateral lower extremity venous duplex completed.    Preliminary report:  See CV proc for preliminary results.  Text paged Dr. Karleen Hampshire at [phone]. Waiting for call back. No call by 9:14. Will attempt to message.  Dracen Reigle, RVT 11/19/2018, 9:09 AM

## 2018-11-19 NOTE — ED Notes (Signed)
ORDERED BFAST TRAY  

## 2018-11-20 DIAGNOSIS — G4733 Obstructive sleep apnea (adult) (pediatric): Secondary | ICD-10-CM

## 2018-11-20 LAB — BASIC METABOLIC PANEL
Anion gap: 9 (ref 5–15)
BUN: 12 mg/dL (ref 6–20)
CO2: 21 mmol/L — ABNORMAL LOW (ref 22–32)
Calcium: 8.2 mg/dL — ABNORMAL LOW (ref 8.9–10.3)
Chloride: 107 mmol/L (ref 98–111)
Creatinine, Ser: 1.2 mg/dL (ref 0.61–1.24)
GFR calc Af Amer: >60 mL/min (ref >60)
GFR calc non Af Amer: >60 mL/min (ref >60)
Glucose, Bld: 89 mg/dL (ref 70–99)
Potassium: 4.2 mmol/L (ref 3.5–5.1)
Sodium: 137 mmol/L (ref 135–145)

## 2018-11-20 LAB — CBC
HCT: 44.9 % (ref 39.0–52.0)
Hemoglobin: 15.7 g/dL (ref 13.0–17.0)
MCH: 42 pg — ABNORMAL HIGH (ref 26.0–34.0)
MCHC: 35 g/dL (ref 30.0–36.0)
MCV: 120.1 fL — ABNORMAL HIGH (ref 80.0–100.0)
Platelets: 177 10*3/uL (ref 150–400)
RBC: 3.74 MIL/uL — ABNORMAL LOW (ref 4.22–5.81)
RDW: 13.4 % (ref 11.5–15.5)
WBC: 13.1 10*3/uL — ABNORMAL HIGH (ref 4.0–10.5)
nRBC: 0.2 % (ref 0.0–0.2)

## 2018-11-20 LAB — HEPARIN LEVEL (UNFRACTIONATED): Heparin Unfractionated: 0.4 IU/mL (ref 0.30–0.70)

## 2018-11-20 MED ORDER — FLUTICASONE PROPIONATE 50 MCG/ACT NA SUSP
1.0000 | Freq: Every day | NASAL | Status: DC
  Administered 2018-11-20 – 2018-11-21 (×2): 1 via NASAL
  Filled 2018-11-20: qty 16

## 2018-11-20 NOTE — Progress Notes (Signed)
PROGRESS NOTE    Richard Osborn  OVF:[id number] DOB: 03/02/1976 DOA: 11/18/2018 PCP: Salli QuarryZimmermann, Matt, PA-C   Brief Narrative:   Richard AuerbachDaniel S Gaydonis a 43 y.o.malewithhistory of sleep apnea presents to the ER with complaints of worsening shortness of breath over the last 1 week. Patient states that he has been progressively getting short of breath on exertion and to the point that he is been short of breath even at rest now. Has chronic right lower extremity swelling.  He ws found to have bilateral PE and right popliteal DVT.   Assessment & Plan:   Principal Problem:   Bilateral pulmonary embolism (HCC) Active Problems:   OSA (obstructive sleep apnea)   Chronic alcoholism (HCC)   ARF (acute renal failure) (HCC)   Bilateral Pulmonary Embolism and right popliteal DVT:  Recommend to continue with IV heparin for another 24 hours and Transition to NOAC on discharge.  Will put case manager consult to check benefits and co pay for NOAC.  TTE reviewed showed severe systolic dysfunction of the RV.  Requested IR to see if he is a candidate for thrombolysis.  He is currently stable, weaned his oxygen to 2 lit of [place].  His tachycardia improved and sob improved.  No chest pain .     OSA: Resume CPAP.    Chronic alcoholism:   Resume CIWA. Watch for withdrawal symptoms.    ARF Resolved with hydration.         DVT prophylaxis: heparin.  Code Status: full code.  Family Communication: none at bedside.  Disposition Plan: Pending clinical improvement.    Consultants:   PCCM  IR     Procedures: echocardiogram.    Antimicrobials: none.    Subjective: Breathing is better.   Objective: Vitals:   11/19/18 2245 11/20/18 0437 11/20/18 0800 11/20/18 1200  BP:  110/84 101/80 104/74  Pulse:  91 98 88  Resp: 18     Temp:  97.9 F (36.6 C) 98.5 F (36.9 C)   TempSrc:  Oral Oral   SpO2: 97% 97% 95% 95%  Weight:  (!) 152 kg    Height:        Intake/Output  Summary (Last 24 hours) at 11/20/2018 1248 Last data filed at 11/20/2018 0900 Gross per 24 hour  Intake 2604.98 ml  Output --  Net 2604.98 ml   Filed Weights   11/19/18 0000 11/19/18 0042 11/20/18 0437  Weight: (!) 145.2 kg (!) 145 kg (!) 152 kg    Examination:  General exam: Appears calm and comfortable on 2 lit of  oxygen.  Respiratory system: Clear to auscultation. Respiratory effort normal. Cardiovascular system: S1 & S2 heard, RRR. No JVD, murmurs, rubs, gallops or clicks. No pedal edema. Gastrointestinal system: Abdomen is nondistended, soft and nontender. No organomegaly or masses felt. Normal bowel sounds heard. Central nervous system: Alert and oriented. No focal neurological deficits. Extremities right lower extremity swelling.  Skin: No rashes, lesions or ulcers Psychiatry:  Mood & affect appropriate.     Data Reviewed: I have personally reviewed following labs and imaging studies  CBC: Recent Labs  Lab 11/18/18 2224 11/19/18 0203 11/20/18 0616  WBC 18.8* 17.9* 13.1*  HGB 19.2* 17.3* 15.7  HCT 53.1* 49.8 44.9  MCV 117.5* 119.4* 120.1*  PLT 246 230 177   Basic Metabolic Panel: Recent Labs  Lab 11/18/18 2224 11/19/18 0203 11/20/18 0616  NA 135 136 137  K 4.9 4.9 4.2  CL 104 105 107  CO2 15* 20* 21*  GLUCOSE 154* 127* 89  BUN CREATININE 1.47* 1.46* 1.20  CALCIUM 8.7* 8.1* 8.2*   GFR: Estimated Creatinine Clearance: 117.4 mL/min (by C-G formula based on SCr of 1.2 mg/dL). Liver Function Tests: No results for input(s): AST, ALT, ALKPHOS, BILITOT, PROT, ALBUMIN in the last 168 hours. No results for input(s): LIPASE, AMYLASE in the last 168 hours. No results for input(s): AMMONIA in the last 168 hours. Coagulation Profile: No results for input(s): INR, PROTIME in the last 168 hours. Cardiac Enzymes: Recent Labs  Lab 11/18/18 2224 11/19/18 0202 11/19/18 0808 11/19/18 1459  TROPONINI 0.46* 0.37* 0.32* 0.20*   BNP (last 3 results) No  results for input(s): PROBNP in the last 8760 hours. HbA1C: No results for input(s): HGBA1C in the last 72 hours. CBG: No results for input(s): GLUCAP in the last 168 hours. Lipid Profile: No results for input(s): CHOL, HDL, LDLCALC, TRIG, CHOLHDL, LDLDIRECT in the last 72 hours. Thyroid Function Tests: Recent Labs    11/19/18 0202  TSH 2.463   Anemia Panel: No results for input(s): VITAMINB12, FOLATE, FERRITIN, TIBC, IRON, RETICCTPCT in the last 72 hours. Sepsis Labs: No results for input(s): PROCALCITON, LATICACIDVEN in the last 168 hours.  Recent Results (from the past 240 hour(s))  SARS Coronavirus 2 (CEPHEID - Performed in [facility] hospital lab), Hosp Order     Status: None   Collection Time: 11/19/18 12:36 AM   Specimen: Nasopharyngeal Swab  Result Value Ref Range Status   SARS Coronavirus 2 NEGATIVE NEGATIVE Final    Comment: (NOTE) If result is NEGATIVE SARS-CoV-2 target nucleic acids are NOT DETECTED. The SARS-CoV-2 RNA is generally detectable in upper and lower  respiratory specimens during the acute phase of infection. The lowest  concentration of SARS-CoV-2 viral copies this assay can detect is 250  copies / mL. A negative result does not preclude SARS-CoV-2 infection  and should not be used as the sole basis for treatment or other  patient management decisions.  A negative result may occur with  improper specimen collection / handling, submission of specimen other  than nasopharyngeal swab, presence of viral mutation(s) within the  areas targeted by this assay, and inadequate number of viral copies  (<250 copies / mL). A negative result must be combined with clinical  observations, patient history, and epidemiological information. If result is POSITIVE SARS-CoV-2 target nucleic acids are DETECTED. The SARS-CoV-2 RNA is generally detectable in upper and lower  respiratory specimens dur ing the acute phase of infection.  Positive  results are indicative of  active infection with SARS-CoV-2.  Clinical  correlation with patient history and other diagnostic information is  necessary to determine patient infection status.  Positive results do  not rule out bacterial infection or co-infection with other viruses. If result is PRESUMPTIVE POSTIVE SARS-CoV-2 nucleic acids MAY BE PRESENT.   A presumptive positive result was obtained on the submitted specimen  and confirmed on repeat testing.  While 2019 novel coronavirus  (SARS-CoV-2) nucleic acids may be present in the submitted sample  additional confirmatory testing may be necessary for epidemiological  and / or clinical management purposes  to differentiate between  SARS-CoV-2 and other Sarbecovirus currently known to infect humans.  If clinically indicated additional testing with an alternate test  methodology [phone]) is advised. The SARS-CoV-2 RNA is generally  detectable in upper and lower respiratory sp ecimens during the acute  phase of infection. The expected result is Negative. Fact Sheet for Patients:  BoilerBrush.com.cy Fact  Sheet for Healthcare Providers: https://pope.com/https://www.fda.gov/media/136313/download This test is not yet approved or cleared by the [place] FDA and has been authorized for detection and/or diagnosis of SARS-CoV-2 by FDA under an Emergency Use Authorization (EUA).  This EUA will remain in effect (meaning this test can be used) for the duration of the COVID-19 declaration under Section 564(b)(1) of the Act, 21 U.S.C. section 360bbb-3(b)(1), unless the authorization is terminated or revoked sooner. Performed at [facility], [address] [place], [place] [address]          Radiology Studies: Ct Angio Chest Pe W/cm &/or Wo Cm  Result Date: 11/19/2018 CLINICAL DATA:  43 year old male with shortness of breath. History of sleep. EXAM: CT ANGIOGRAPHY CHEST WITH CONTRAST TECHNIQUE: Multidetector CT imaging of the chest was performed  using the standard protocol during bolus administration of intravenous contrast. Multiplanar CT image reconstructions and MIPs were obtained to evaluate the vascular anatomy. CONTRAST:  75mL OMNIPAQUE IOHEXOL 350 MG/ML SOLN COMPARISON:  Chest radiograph dated 11/18/2018 FINDINGS: Cardiovascular: There is no cardiomegaly or pericardial effusion. There is dilatation of the right ventricle measuring approximately 5.2 mm in diameter. The left ventricle measures approximately 2.3 mm in diameter. There is flattening of the interventricular septum. The RV/LV ratio is 2.3. There is retrograde flow of contrast from the right atrium into the IVC indicative of right heart dysfunction. The thoracic aorta is unremarkable for the degree of opacification. Large bilateral pulmonary artery emboli extending from the central pulmonary arteries into the lobar, segmental, and subsegmental branches of the upper and lower lobes bilaterally. Mediastinum/Nodes: There is no hilar or mediastinal adenopathy. The esophagus is grossly unremarkable. No mediastinal fluid collection or hematoma. Lungs/Pleura: The lungs are clear. There is no pleural effusion or pneumothorax. The central airways are patent. Upper Abdomen: No acute abnormality. Musculoskeletal: Mild degenerative changes. Review of the MIP images confirms the above findings. IMPRESSION: Large bilateral pulmonary artery emboli with CT evidence of right heart strain (RV/LV Ratio = 2.3) consistent with at least submassive (intermediate risk) PE. The presence of right heart strain has been associated with an increased risk of morbidity and mortality. Please activate Code PE by paging [phone]. These results were called by telephone at the time of interpretation on 11/19/2018 at 12:10 am to Dr. Bebe ShaggyWickline, who verbally acknowledged these results. Electronically Signed   By: Elgie CollardArash  Radparvar M.D.   On: 11/19/2018 00:18   Dg Chest Portable 1 View  Result Date: 11/18/2018 CLINICAL DATA:   43 year old male which shortness of breath. EXAM: PORTABLE CHEST 1 VIEW COMPARISON:  None. FINDINGS: There is mild cardiomegaly. There is mild prominence of the central and hilar vasculature. No focal consolidation, pleural effusion, or pneumothorax. No acute osseous pathology. IMPRESSION: Mild cardiomegaly with possible mild vascular congestion. No focal consolidation. Electronically Signed   By: Elgie CollardArash  Radparvar M.D.   On: 11/18/2018 22:57   Vas [place] Lower Extremity Venous (dvt)  Result Date: 11/20/2018  Lower Venous Study Indications: Pulmonary embolism.  Risk Factors: Confirmed PE. Comparison Study: Prior negative LLE duplex from 04/12/14, is available Performing Technologist: Sherren Kernsandace Kanady RVS  Examination Guidelines: A complete evaluation includes B-mode imaging, spectral Doppler, color Doppler, and power Doppler as needed of all accessible portions of each vessel. Bilateral testing is considered an integral part of a complete examination. Limited examinations for reoccurring indications may be performed as noted.  +---------+---------------+---------+-----------+----------+-------+  RIGHT     Compressibility Phasicity Spontaneity Properties Summary  +---------+---------------+---------+-----------+----------+-------+  CFV  Full            Yes       Yes                             +---------+---------------+---------+-----------+----------+-------+  SFJ       Full                                                      +---------+---------------+---------+-----------+----------+-------+  FV Prox   Full                                                      +---------+---------------+---------+-----------+----------+-------+  FV Mid    Full                                                      +---------+---------------+---------+-----------+----------+-------+  FV Distal Full                                                      +---------+---------------+---------+-----------+----------+-------+  PFV        Full                                                      +---------+---------------+---------+-----------+----------+-------+  POP       None            No        No                     Acute    +---------+---------------+---------+-----------+----------+-------+  PTV       Full                                                      +---------+---------------+---------+-----------+----------+-------+  PERO      Full                                                      +---------+---------------+---------+-----------+----------+-------+   +---------+---------------+---------+-----------+----------+-------+  LEFT      Compressibility Phasicity Spontaneity Properties Summary  +---------+---------------+---------+-----------+----------+-------+  CFV       Full            Yes       Yes                             +---------+---------------+---------+-----------+----------+-------+  SFJ       Full                                                      +---------+---------------+---------+-----------+----------+-------+  FV Prox   Full                                                      +---------+---------------+---------+-----------+----------+-------+  FV Mid    Full                                                      +---------+---------------+---------+-----------+----------+-------+  FV Distal Full                                                      +---------+---------------+---------+-----------+----------+-------+  PFV       Full                                                      +---------+---------------+---------+-----------+----------+-------+  POP       Full            Yes       Yes                             +---------+---------------+---------+-----------+----------+-------+  PTV       Full                                                      +---------+---------------+---------+-----------+----------+-------+  PERO      Full                                                       +---------+---------------+---------+-----------+----------+-------+     Summary: Right: Findings consistent with acute deep vein thrombosis involving the right popliteal vein. Left: There is no evidence of deep vein thrombosis in the lower extremity.  *See table(s) above for measurements and observations. Electronically signed by Waverly Ferrarihristopher Dickson MD on 11/20/2018 at 11:46:01 AM.    Final         Scheduled Meds:  fluticasone  1 spray Each Nare Daily   folic acid  1 mg Oral Daily   LORazepam  0-4 mg Oral Q6H   Followed by   Melene Muller[START ON 11/21/2018] LORazepam  0-4 mg Oral Q12H   multivitamin with minerals  1 tablet Oral  Daily   thiamine  100 mg Oral Daily   Continuous Infusions:  heparin 1,900 Units/hr (11/20/18 0925)     LOS: 1 day    Time spent: 38 minutes.     Kathlen Mody, MD [facility] Pager [phone]   If 7PM-7AM, please contact night-coverage [email or web address] Password [facility] 11/20/2018, 12:48 PM

## 2018-11-20 NOTE — Progress Notes (Signed)
[place] for heparin Indication: pulmonary embolus  No Known Allergies  Patient Measurements: Height: 5\' 10"  (177.8 cm) Weight: (!) 335 lb 1.6 oz (152 kg) IBW/kg (Calculated) : 73 Heparin Dosing Weight: 105  Vital Signs: Temp: 97.9 F (36.6 C) (06/23 0437) Temp Source: Oral (06/23 0437) BP: 110/84 (06/23 0437) Pulse Rate: 91 (06/23 0437)  Labs: Recent Labs    11/18/18 2224 11/19/18 0202 11/19/18 0203 11/19/18 0808 11/19/18 1459 11/20/18 0616  HGB 19.2*  --  17.3*  --   --  15.7  HCT 53.1*  --  49.8  --   --  44.9  PLT 246  --  230  --   --  177  HEPARINUNFRC  --   --   --  0.47 0.32 0.40  CREATININE 1.47*  --  1.46*  --   --   --   TROPONINI 0.46* 0.37*  --  0.32* 0.20*  --     Estimated Creatinine Clearance: 96.5 mL/min (A) (by C-G formula based on SCr of 1.46 mg/dL (H)).  Assessment: 43 y.o. male with large bilateral PE continues on IV heparin. Initial heparin level is therapeutic. CBC is ok with elevated Hgb. No bleeding noted.   Heparin level therapeutic    Goal of Therapy:  Heparin level 0.3-0.7 units/ml Monitor platelets by anticoagulation protocol: Yes   Plan: Continue heparin gtt 1900 units/hr Daily heparin level and CBC F/u plans for oral anticoagulation  Thank you for involving pharmacy in this patient's care.  Anette Guarneri, PharmD [phone] 11/20/2018 8:56 AM

## 2018-11-21 DIAGNOSIS — I82431 Acute embolism and thrombosis of right popliteal vein: Secondary | ICD-10-CM

## 2018-11-21 LAB — CBC
HCT: 46.9 % (ref 39.0–52.0)
Hemoglobin: 16.1 g/dL (ref 13.0–17.0)
MCH: 41.6 pg — ABNORMAL HIGH (ref 26.0–34.0)
MCHC: 34.3 g/dL (ref 30.0–36.0)
MCV: 121.2 fL — ABNORMAL HIGH (ref 80.0–100.0)
Platelets: 190 10*3/uL (ref 150–400)
RBC: 3.87 MIL/uL — ABNORMAL LOW (ref 4.22–5.81)
RDW: 13.4 % (ref 11.5–15.5)
WBC: 12.7 10*3/uL — ABNORMAL HIGH (ref 4.0–10.5)
nRBC: 0 % (ref 0.0–0.2)

## 2018-11-21 LAB — HEPARIN LEVEL (UNFRACTIONATED): Heparin Unfractionated: 0.22 IU/mL — ABNORMAL LOW (ref 0.30–0.70)

## 2018-11-21 MED ORDER — ELIQUIS 5 MG VTE STARTER PACK: ORAL_TABLET | ORAL | 0 refills | Status: DC

## 2018-11-21 MED ORDER — APIXABAN 5 MG PO TABS: 5.0000 mg | ORAL_TABLET | Freq: Two times a day (BID) | ORAL | Status: DC

## 2018-11-21 MED ORDER — APIXABAN 5 MG PO TABS
10.0000 mg | ORAL_TABLET | Freq: Two times a day (BID) | ORAL | Status: DC
  Administered 2018-11-21: 10 mg via ORAL
  Filled 2018-11-21: qty 2

## 2018-11-21 MED FILL — ELIQUIS STARTER PACK 5 MG T: 5 | 30 days supply | Qty: 74 | Fill #0

## 2018-11-21 NOTE — Discharge Instructions (Addendum)
Richard Osborn,  You were in the hospital because of a blood clot in your lung arteries and in a vein in your leg. This is being treated with blood thinners. You are still having some trouble breathing but this has improved. If it worsens, please return for reevaluation. Your heart was strained but hopefully this will improve with treatment of your blood clot. Please follow-up with your primary care physician.   Information on my medicine - ELIQUIS (apixaban)  This medication education was reviewed with me or my healthcare representative as part of my discharge preparation.  The pharmacist that spoke with me during my hospital stay was:  Dareen Piano, [facility]  Why was Eliquis prescribed for you? Eliquis was prescribed to treat blood clots that may have been found in the veins of your legs (deep vein thrombosis) or in your lungs (pulmonary embolism) and to reduce the risk of them occurring again.  What do You need to know about Eliquis ? The starting dose is 10 mg (two 5 mg tablets) taken TWICE daily for the FIRST SEVEN (7) DAYS, then on 11/28/18  the dose is reduced to ONE 5 mg tablet taken TWICE daily.  Eliquis may be taken with or without food.   Try to take the dose about the same time in the morning and in the evening. If you have difficulty swallowing the tablet whole please discuss with your pharmacist how to take the medication safely.  Take Eliquis exactly as prescribed and DO NOT stop taking Eliquis without talking to the doctor who prescribed the medication.  Stopping may increase your risk of developing a new blood clot.  Refill your prescription before you run out.  After discharge, you should have regular check-up appointments with your healthcare provider that is prescribing your Eliquis.    What do you do if you miss a dose? If a dose of ELIQUIS is not taken at the scheduled time, take it as soon as possible on the same day and twice-daily administration should be  resumed. The dose should not be doubled to make up for a missed dose.  Important Safety Information A possible side effect of Eliquis is bleeding. You should call your healthcare provider right away if you experience any of the following: ? Bleeding from an injury or your nose that does not stop. ? Unusual colored urine (red or dark brown) or unusual colored stools (red or black). ? Unusual bruising for unknown reasons. ? A serious fall or if you hit your head (even if there is no bleeding).  Some medicines may interact with Eliquis and might increase your risk of bleeding or clotting while on Eliquis. To help avoid this, consult your healthcare provider or pharmacist prior to using any new prescription or non-prescription medications, including herbals, vitamins, non-steroidal anti-inflammatory drugs (NSAIDs) and supplements.  This website has more information on Eliquis (apixaban): http://www.eliquis.com/eliquis/home

## 2018-11-21 NOTE — Discharge Summary (Signed)
Physician Discharge Summary  Richard Osborn ZOX:[id number] DOB: 07/11/1975 DOA: 11/18/2018  PCP: Salli QuarryZimmermann, Matt, PA-C  Admit date: 11/18/2018 Discharge date: 11/21/2018  Admitted From: Home Disposition: Home  Recommendations for Outpatient Follow-up:  1. Follow up with PCP in 1 week 2. Please obtain BMP/CBC in one week 3. Please follow up on the following pending results: None  Home Health: None Equipment/Devices: None  Discharge Condition: Stable CODE STATUS: Full code Diet recommendation: Regular   Brief/Interim Summary:  Admission HPI written by Eduard ClosArshad N Kakrakandy, MD   Chief Complaint: Shortness of breath.  HPI: Richard Osborn is a 43 y.o. male with history of sleep apnea presents to the ER with complaints of worsening shortness of breath over the last 1 week.  Patient states that he has been progressively getting short of breath on exertion and to the point that he is been short of breath even at rest now.  Has chronic right lower extremity swelling.  Denies chest pain has been having some dizziness on standing.  Has not had any fever chills productive cough.  ED Course: In the ER patient was tachycardic blood pressures in the low normals.  EKG shows sinus tachycardia with nonspecific T wave changes anterolateral leads.  Creatinine 1.4 glucose 154 troponin I 0.46 BNP 270 and CT angiogram of the chest done shows bilateral pulmonary embolism with strain pattern.  Pulmonary critical care was consulted and at this time patient is being admitted for further management patient is being started on heparin for PE.    Hospital course:  Acute bilateral PE Acute right popliteal DVT Unsure fully of etiology. Patient is more sedentary than usual which may contribute. First DVT/PE. Hypercoagulable workup not performed, but could be considered if this recurs. He was treated with IV heparin for just over 48 hours. Transthoracic Echocardiogram significant for severely reduced RV  function with some RV dilation. Lysis was discussed with interventional radiology and critical care but decided against secondary to improvement of symptoms and stability of vitals. Patient was transitioned to Eliquis on discharge.  OSA Continue CPAP  Chronic alcoholism CIWA while inpatient.  Acute kidney injury Unknown baseline. Creatinine of 1.47 on admission and improved to 1.2 prior to discharge. Outpatient follow-up.  Discharge Diagnoses:  Principal Problem:   Bilateral pulmonary embolism (HCC) Active Problems:   OSA (obstructive sleep apnea)   Chronic alcoholism (HCC)   ARF (acute renal failure) [facility](HCC)    Discharge Instructions  Discharge Instructions    Call MD for:  difficulty breathing, headache or visual disturbances   Complete by: As directed    Call MD for:  persistant dizziness or light-headedness   Complete by: As directed    Call MD for:  severe uncontrolled pain   Complete by: As directed    Increase activity slowly   Complete by: As directed      Allergies as of 11/21/2018   No Known Allergies     Medication List    STOP taking these medications   cephALEXin 500 MG capsule Commonly known as: KEFLEX     TAKE these medications   Chantix 1 MG tablet Generic drug: varenicline Take 1 mg by mouth 2 (two) times a day.   Eliquis DVT/PE Starter Pack 5 MG Tabs Take as directed on package: start with two-5mg  tablets twice daily for 7 days. On day 8, switch to one-5mg  tablet twice daily.   testosterone cypionate 200 MG/ML injection Commonly known as: DEPOTESTOSTERONE CYPIONATE Inject 1 mL into the  muscle See admin instructions. Every 10 days      Follow-up Information    Edmonia James, PA-C. Go on 11/27/2018.   Specialty: Physician Assistant Why: Hospital follow-up Contact information: [address] [place] [address] [phone]          No Known Allergies  Consultations:  Interventional radiology   Procedures/Studies: Ct  Angio Chest Pe W/cm &/or Wo Cm  Result Date: 11/19/2018 CLINICAL DATA:  43 year old male with shortness of breath. History of sleep. EXAM: CT ANGIOGRAPHY CHEST WITH CONTRAST TECHNIQUE: Multidetector CT imaging of the chest was performed using the standard protocol during bolus administration of intravenous contrast. Multiplanar CT image reconstructions and MIPs were obtained to evaluate the vascular anatomy. CONTRAST:  39mL OMNIPAQUE IOHEXOL 350 MG/ML SOLN COMPARISON:  Chest radiograph dated 11/18/2018 FINDINGS: Cardiovascular: There is no cardiomegaly or pericardial effusion. There is dilatation of the right ventricle measuring approximately 5.2 mm in diameter. The left ventricle measures approximately 2.3 mm in diameter. There is flattening of the interventricular septum. The RV/LV ratio is 2.3. There is retrograde flow of contrast from the right atrium into the IVC indicative of right heart dysfunction. The thoracic aorta is unremarkable for the degree of opacification. Large bilateral pulmonary artery emboli extending from the central pulmonary arteries into the lobar, segmental, and subsegmental branches of the upper and lower lobes bilaterally. Mediastinum/Nodes: There is no hilar or mediastinal adenopathy. The esophagus is grossly unremarkable. No mediastinal fluid collection or hematoma. Lungs/Pleura: The lungs are clear. There is no pleural effusion or pneumothorax. The central airways are patent. Upper Abdomen: No acute abnormality. Musculoskeletal: Mild degenerative changes. Review of the MIP images confirms the above findings. IMPRESSION: Large bilateral pulmonary artery emboli with CT evidence of right heart strain (RV/LV Ratio = 2.3) consistent with at least submassive (intermediate risk) PE. The presence of right heart strain has been associated with an increased risk of morbidity and mortality. Please activate Code PE by paging [phone]. These results were called by telephone at the time of  interpretation on 11/19/2018 at 12:10 am to Dr. Christy Gentles, who verbally acknowledged these results. Electronically Signed   By: Anner Crete M.D.   On: 11/19/2018 00:18   Dg Chest Portable 1 View  Result Date: 11/18/2018 CLINICAL DATA:  43 year old male which shortness of breath. EXAM: PORTABLE CHEST 1 VIEW COMPARISON:  None. FINDINGS: There is mild cardiomegaly. There is mild prominence of the central and hilar vasculature. No focal consolidation, pleural effusion, or pneumothorax. No acute osseous pathology. IMPRESSION: Mild cardiomegaly with possible mild vascular congestion. No focal consolidation. Electronically Signed   By: Anner Crete M.D.   On: 11/18/2018 22:57   Vas [place] Lower Extremity Venous (dvt)  Result Date: 11/20/2018  Lower Venous Study Indications: Pulmonary embolism.  Risk Factors: Confirmed PE. Comparison Study: Prior negative LLE duplex from 04/12/14, is available Performing Technologist: Sharion Dove RVS  Examination Guidelines: A complete evaluation includes B-mode imaging, spectral Doppler, color Doppler, and power Doppler as needed of all accessible portions of each vessel. Bilateral testing is considered an integral part of a complete examination. Limited examinations for reoccurring indications may be performed as noted.  +---------+---------------+---------+-----------+----------+-------+  RIGHT     Compressibility Phasicity Spontaneity Properties Summary  +---------+---------------+---------+-----------+----------+-------+  CFV       Full            Yes       Yes                             +---------+---------------+---------+-----------+----------+-------+  SFJ       Full                                                      +---------+---------------+---------+-----------+----------+-------+  FV Prox   Full                                                      +---------+---------------+---------+-----------+----------+-------+  FV Mid    Full                                                       +---------+---------------+---------+-----------+----------+-------+  FV Distal Full                                                      +---------+---------------+---------+-----------+----------+-------+  PFV       Full                                                      +---------+---------------+---------+-----------+----------+-------+  POP       None            No        No                     Acute    +---------+---------------+---------+-----------+----------+-------+  PTV       Full                                                      +---------+---------------+---------+-----------+----------+-------+  PERO      Full                                                      +---------+---------------+---------+-----------+----------+-------+   +---------+---------------+---------+-----------+----------+-------+  LEFT      Compressibility Phasicity Spontaneity Properties Summary  +---------+---------------+---------+-----------+----------+-------+  CFV       Full            Yes       Yes                             +---------+---------------+---------+-----------+----------+-------+  SFJ       Full                                                      +---------+---------------+---------+-----------+----------+-------+  FV Prox   Full                                                      +---------+---------------+---------+-----------+----------+-------+  FV Mid    Full                                                      +---------+---------------+---------+-----------+----------+-------+  FV Distal Full                                                      +---------+---------------+---------+-----------+----------+-------+  PFV       Full                                                      +---------+---------------+---------+-----------+----------+-------+  POP       Full            Yes       Yes                              +---------+---------------+---------+-----------+----------+-------+  PTV       Full                                                      +---------+---------------+---------+-----------+----------+-------+  PERO      Full                                                      +---------+---------------+---------+-----------+----------+-------+     Summary: Right: Findings consistent with acute deep vein thrombosis involving the right popliteal vein. Left: There is no evidence of deep vein thrombosis in the lower extremity.  *See table(s) above for measurements and observations. Electronically signed by Waverly Ferrari MD on 11/20/2018 at 11:46:01 AM.    Final      6/22: Transthoracic Echocardiogram IMPRESSIONS    1. The left ventricle has normal systolic function, with an ejection fraction of 55-60%. The cavity size was normal. There is severely increased left ventricular wall thickness. Left ventricular diastolic Doppler parameters are consistent with impaired  relaxation.  2. The right ventricle has severely reduced systolic function. The cavity was mildly enlarged.  3. The mitral valve is grossly normal.  4. The aortic valve was not well visualized. Aortic valve regurgitation was not assessed by color flow Doppler.  5. The inferior vena cava was dilated in size with <50% respiratory variability.  6. Extremely limited; definity used; normal LV systolic function; probable mild RAE and RVE with  severe RV dysfunction; no TR to estimate pulmonary pressure.   Subjective: Some dyspnea with ambulation. Lightheadedness when getting up, but overall feels much better than previous.  Discharge Exam: Vitals:   11/21/18 0815 11/21/18 1200  BP: (!) 145/97 129/83  Pulse: (!) 114 86  Resp:    Temp:    SpO2: 99% 98%   Vitals:   11/21/18 0615 11/21/18 0800 11/21/18 0815 11/21/18 1200  BP: (!) 131/99 128/87 (!) 145/97 129/83  Pulse: 85 85 (!) 114 86  Resp:      Temp: 98 F (36.7 C) 97.8 F  (36.6 C)    TempSrc: Oral Oral    SpO2: 98% 98% 99% 98%  Weight: (!) 152.1 kg     Height:        General: Pt is alert, awake, not in acute distress Cardiovascular: RRR, S1/S2 +, no rubs, no gallops Respiratory: CTA bilaterally, no wheezing, no rhonchi Abdominal: Soft, NT, ND, bowel sounds + Extremities: no edema, no cyanosis    The results of significant diagnostics from this hospitalization (including imaging, microbiology, ancillary and laboratory) are listed below for reference.     Microbiology: Recent Results (from the past 240 hour(s))  SARS Coronavirus 2 (CEPHEID - Performed in [facility] hospital lab), Hosp Order     Status: None   Collection Time: 11/19/18 12:36 AM   Specimen: Nasopharyngeal Swab  Result Value Ref Range Status   SARS Coronavirus 2 NEGATIVE NEGATIVE Final    Comment: (NOTE) If result is NEGATIVE SARS-CoV-2 target nucleic acids are NOT DETECTED. The SARS-CoV-2 RNA is generally detectable in upper and lower  respiratory specimens during the acute phase of infection. The lowest  concentration of SARS-CoV-2 viral copies this assay can detect is 250  copies / mL. A negative result does not preclude SARS-CoV-2 infection  and should not be used as the sole basis for treatment or other  patient management decisions.  A negative result may occur with  improper specimen collection / handling, submission of specimen other  than nasopharyngeal swab, presence of viral mutation(s) within the  areas targeted by this assay, and inadequate number of viral copies  (<250 copies / mL). A negative result must be combined with clinical  observations, patient history, and epidemiological information. If result is POSITIVE SARS-CoV-2 target nucleic acids are DETECTED. The SARS-CoV-2 RNA is generally detectable in upper and lower  respiratory specimens dur ing the acute phase of infection.  Positive  results are indicative of active infection with SARS-CoV-2.  Clinical    correlation with patient history and other diagnostic information is  necessary to determine patient infection status.  Positive results do  not rule out bacterial infection or co-infection with other viruses. If result is PRESUMPTIVE POSTIVE SARS-CoV-2 nucleic acids MAY BE PRESENT.   A presumptive positive result was obtained on the submitted specimen  and confirmed on repeat testing.  While 2019 novel coronavirus  (SARS-CoV-2) nucleic acids may be present in the submitted sample  additional confirmatory testing may be necessary for epidemiological  and / or clinical management purposes  to differentiate between  SARS-CoV-2 and other Sarbecovirus currently known to infect humans.  If clinically indicated additional testing with an alternate test  methodology [phone]) is advised. The SARS-CoV-2 RNA is generally  detectable in upper and lower respiratory sp ecimens during the acute  phase of infection. The expected result is Negative. Fact Sheet for Patients:  BoilerBrush.com.cy Fact Sheet for Healthcare Providers: https://pope.com/ This test is not yet approved  or cleared by the [place] FDA and has been authorized for detection and/or diagnosis of SARS-CoV-2 by FDA under an Emergency Use Authorization (EUA).  This EUA will remain in effect (meaning this test can be used) for the duration of the COVID-19 declaration under Section 564(b)(1) of the Act, 21 U.S.C. section 360bbb-3(b)(1), unless the authorization is terminated or revoked sooner. Performed at [facility], [address] [place], [place] [address]      Labs: BNP (last 3 results) Recent Labs    11/18/18 2300  BNP 270.3*   Basic Metabolic Panel: Recent Labs  Lab 11/18/18 2224 11/19/18 0203 11/20/18 0616  NA 135 136 137  K 4.9 4.9 4.2  CL 104 105 107  CO2 15* 20* 21*  GLUCOSE 154* 127* 89  BUN 12 11 12   CREATININE 1.47* 1.46* 1.20  CALCIUM 8.7*  8.1* 8.2*   Liver Function Tests: No results for input(s): AST, ALT, ALKPHOS, BILITOT, PROT, ALBUMIN in the last 168 hours. No results for input(s): LIPASE, AMYLASE in the last 168 hours. No results for input(s): AMMONIA in the last 168 hours. CBC: Recent Labs  Lab 11/18/18 2224 11/19/18 0203 11/20/18 0616 11/21/18 0543  WBC 18.8* 17.9* 13.1* 12.7*  HGB 19.2* 17.3* 15.7 16.1  HCT 53.1* 49.8 44.9 46.9  MCV 117.5* 119.4* 120.1* 121.2*  PLT 246 230 177 190   Cardiac Enzymes: Recent Labs  Lab 11/18/18 2224 11/19/18 0202 11/19/18 0808 11/19/18 1459  TROPONINI 0.46* 0.37* 0.32* 0.20*   BNP: Invalid input(s): POCBNP CBG: No results for input(s): GLUCAP in the last 168 hours. D-Dimer No results for input(s): DDIMER in the last 72 hours. Hgb A1c No results for input(s): HGBA1C in the last 72 hours. Lipid Profile No results for input(s): CHOL, HDL, LDLCALC, TRIG, CHOLHDL, LDLDIRECT in the last 72 hours. Thyroid function studies Recent Labs    11/19/18 0202  TSH 2.463   Anemia work up No results for input(s): VITAMINB12, FOLATE, FERRITIN, TIBC, IRON, RETICCTPCT in the last 72 hours. Urinalysis    Component Value Date/Time   COLORURINE AMBER (A) 11/22/2016 0542   APPEARANCEUR CLEAR 11/22/2016 0542   LABSPEC 1.024 11/22/2016 0542   PHURINE 5.0 11/22/2016 0542   GLUCOSEU NEGATIVE 11/22/2016 0542   HGBUR NEGATIVE 11/22/2016 0542   BILIRUBINUR SMALL (A) 11/22/2016 0542   KETONESUR 5 (A) 11/22/2016 0542   PROTEINUR NEGATIVE 11/22/2016 0542   NITRITE NEGATIVE 11/22/2016 0542   LEUKOCYTESUR NEGATIVE 11/22/2016 0542   Sepsis Labs Invalid input(s): PROCALCITONIN,  WBC,  LACTICIDVEN Microbiology Recent Results (from the past 240 hour(s))  SARS Coronavirus 2 (CEPHEID - Performed in [facility] hospital lab), Hosp Order     Status: None   Collection Time: 11/19/18 12:36 AM   Specimen: Nasopharyngeal Swab  Result Value Ref Range Status   SARS Coronavirus 2 NEGATIVE  NEGATIVE Final    Comment: (NOTE) If result is NEGATIVE SARS-CoV-2 target nucleic acids are NOT DETECTED. The SARS-CoV-2 RNA is generally detectable in upper and lower  respiratory specimens during the acute phase of infection. The lowest  concentration of SARS-CoV-2 viral copies this assay can detect is 250  copies / mL. A negative result does not preclude SARS-CoV-2 infection  and should not be used as the sole basis for treatment or other  patient management decisions.  A negative result may occur with  improper specimen collection / handling, submission of specimen other  than nasopharyngeal swab, presence of viral mutation(s) within the  areas targeted by  this assay, and inadequate number of viral copies  (<250 copies / mL). A negative result must be combined with clinical  observations, patient history, and epidemiological information. If result is POSITIVE SARS-CoV-2 target nucleic acids are DETECTED. The SARS-CoV-2 RNA is generally detectable in upper and lower  respiratory specimens dur ing the acute phase of infection.  Positive  results are indicative of active infection with SARS-CoV-2.  Clinical  correlation with patient history and other diagnostic information is  necessary to determine patient infection status.  Positive results do  not rule out bacterial infection or co-infection with other viruses. If result is PRESUMPTIVE POSTIVE SARS-CoV-2 nucleic acids MAY BE PRESENT.   A presumptive positive result was obtained on the submitted specimen  and confirmed on repeat testing.  While 2019 novel coronavirus  (SARS-CoV-2) nucleic acids may be present in the submitted sample  additional confirmatory testing may be necessary for epidemiological  and / or clinical management purposes  to differentiate between  SARS-CoV-2 and other Sarbecovirus currently known to infect humans.  If clinically indicated additional testing with an alternate test  methodology [phone]([id number]) is  advised. The SARS-CoV-2 RNA is generally  detectable in upper and lower respiratory sp ecimens during the acute  phase of infection. The expected result is Negative. Fact Sheet for Patients:  BoilerBrush.com.cyhttps://www.fda.gov/media/136312/download Fact Sheet for Healthcare Providers: https://pope.com/https://www.fda.gov/media/136313/download This test is not yet approved or cleared by the [place] FDA and has been authorized for detection and/or diagnosis of SARS-CoV-2 by FDA under an Emergency Use Authorization (EUA).  This EUA will remain in effect (meaning this test can be used) for the duration of the COVID-19 declaration under Section 564(b)(1) of the Act, 21 U.S.C. section 360bbb-3(b)(1), unless the authorization is terminated or revoked sooner. Performed at [facility], [address] [place], [place] [address]     Time coordinating discharge: 35 minutes  SIGNED:   Jacquelin Hawkingalph Lenette Rau, MD [facility] 11/21/2018, 1:24 PM

## 2018-11-21 NOTE — TOC Benefit Eligibility Note (Signed)
Transition of Care [facility]) Benefit Eligibility Note    Patient Details  Name: Richard Osborn MRN: [id number] Date of Birth: 1976/05/25   Medication/Dose: Eliguis 5mg  twice a day fpr 30 day  Covered?: Yes  Tier: 2 Drug  Prescription Coverage Preferred Pharmacy: Any retail pharmacy  Spoke with Person/Company/Phone Number:: Edison Nasuti  Co-Pay: 35.00  Prior Approval: No  Deductible: (Patient has not deductible)  Additional Notes: As per Edison Nasuti eliquis dosn't come in 10mg  co pay will remain the same after the 30.00 supply    Orbie Pyo Phone Number: 11/21/2018, 12:26 PM

## 2018-11-21 NOTE — Progress Notes (Signed)
ANTICOAGULATION CONSULT NOTE  Pharmacy Consult for Heparin >> apixaban Indication: pulmonary embolus, DVT  No Known Allergies  Patient Measurements: Height: 5\' 10"  (177.8 cm) Weight: (!) 335 lb 6.4 oz (152.1 kg) IBW/kg (Calculated) : 73 Heparin Dosing Weight: 105  Vital Signs: Temp: 97.8 F (36.6 C) (06/24 0800) Temp Source: Oral (06/24 0800) BP: 145/97 (06/24 0815) Pulse Rate: 114 (06/24 0815)  Labs: Recent Labs    11/18/18 2224 11/19/18 0202 11/19/18 0203  11/19/18 0808 11/19/18 1459 11/20/18 0616 11/21/18 0543  HGB 19.2*  --  17.3*  --   --   --  15.7 16.1  HCT 53.1*  --  49.8  --   --   --  44.9 46.9  PLT 246  --  230  --   --   --  177 190  HEPARINUNFRC  --   --   --    < > 0.47 0.32 0.40 0.22*  CREATININE 1.47*  --  1.46*  --   --   --  1.20  --   TROPONINI 0.46* 0.37*  --   --  0.32* 0.20*  --   --    < > = values in this interval not displayed.    Estimated Creatinine Clearance: 117.4 mL/min (by C-G formula based on SCr of 1.2 mg/dL).  Assessment: 43 y.o. male with large bilateral PE/DVT  on IV heparin. Pharmacy consulted to transition to apixaban -hg= 16.1, SCr= 1.2    Goal of Therapy:  Heparin level 0.3-0.7 units/ml Monitor platelets by anticoagulation protocol: Yes   Plan: -Discontinue heparin  -apixaban 10mg  bid for 7 days then 5mg  po bid -Will provide patient education  Hildred Laser, PharmD Clinical Pharmacist **Pharmacist phone directory can now be found on [email or web address] ([email or web address]).  Listed under [place].

## 2018-11-21 NOTE — Progress Notes (Signed)
ANTICOAGULATION CONSULT NOTE  Pharmacy Consult for Heparin  Indication: pulmonary embolus, DVT  No Known Allergies  Patient Measurements: Height: 5\' 10"  (177.8 cm) Weight: (!) 335 lb 6.4 oz (152.1 kg) IBW/kg (Calculated) : 73 Heparin Dosing Weight: 105  Vital Signs: Temp: 98 F (36.7 C) (06/24 0615) Temp Source: Oral (06/24 0615) BP: 131/99 (06/24 0615) Pulse Rate: 85 (06/24 0615)  Labs: Recent Labs    11/18/18 2224 11/19/18 0202 11/19/18 0203  11/19/18 0808 11/19/18 1459 11/20/18 0616 11/21/18 0543  HGB 19.2*  --  17.3*  --   --   --  15.7 16.1  HCT 53.1*  --  49.8  --   --   --  44.9 46.9  PLT 246  --  230  --   --   --  177 190  HEPARINUNFRC  --   --   --    < > 0.47 0.32 0.40 0.22*  CREATININE 1.47*  --  1.46*  --   --   --  1.20  --   TROPONINI 0.46* 0.37*  --   --  0.32* 0.20*  --   --    < > = values in this interval not displayed.    Estimated Creatinine Clearance: 117.4 mL/min (by C-G formula based on SCr of 1.2 mg/dL).  Assessment: 43 y.o. male with large bilateral PE/DVT continues on IV heparin. Initial heparin level is therapeutic. CBC is ok with elevated Hgb. No bleeding noted.   6/24 AM update:  Heparin level low  No issues per RN   Goal of Therapy:  Heparin level 0.3-0.7 units/ml Monitor platelets by anticoagulation protocol: Yes   Plan: Inc heparin to 2100 units/hr Re-check heparin level in 6-8 hours F/u plans for oral anticoagulation  Narda Bonds, PharmD, BCPS Clinical Pharmacist Phone: [phone]

## 2018-11-21 NOTE — Care Management (Signed)
11-21-18 1329 CM did provide patient with Eliquis discount card. No further needs from CM at this time. Bethena Roys, RN,BSN Case Manager [phone]

## 2018-11-21 NOTE — Progress Notes (Addendum)
Pt ambulated in hall with RN. Pt complain of SHOB and dizziness. Pt stated "I feel much better than I did when I came in, but I still feel SHOB and slightly dizzy." Pt had no difficulty walking in hall, only assessment finding was [facility]. O2 Sat. remained above 95% on RA.   Pre Ambulation:  BP: 128/87  HR: 110 O2 Sat: 98 RA  Post Ambulation:  BP: 145/97 HR: 113 O2 Sat: 99% RA

## 2019-01-22 ENCOUNTER — Ambulatory Visit: Payer: BC Managed Care – PPO | Admitting: Adult Health

## 2019-01-29 ENCOUNTER — Ambulatory Visit: Payer: BC Managed Care – PPO | Admitting: Pulmonary Disease

## 2019-01-29 ENCOUNTER — Encounter: Payer: Self-pay | Admitting: Pulmonary Disease

## 2019-01-29 ENCOUNTER — Other Ambulatory Visit: Payer: Self-pay

## 2019-01-29 VITALS — BP 120/82 | HR 83 | Temp 97.6°F | Ht 69.0 in | Wt 338.2 lb

## 2019-01-29 DIAGNOSIS — G4733 Obstructive sleep apnea (adult) (pediatric): Secondary | ICD-10-CM

## 2019-01-29 NOTE — Progress Notes (Signed)
Richard Osborn    [id number]    02/09/1976  Primary Care Physician:Zimmermann, Zachery Dauer  Referring Physician: Edmonia James, PA-C [address] [place],  [place] [address]  Chief complaint:   Patient with a history of obstructive sleep apnea diagnosed about 18 years ago Has been using CPAP on a regular basis Machine is falling apart  HPI:  Diagnosed about age 43 Was compliant with CPAP use Did notice significant improvement in symptoms with CPAP use  Lately has not been able to use machine on a regular basis as the machine is falling apart  Denies any morning headaches Occasional dryness of his mouth in the mornings Memory has been poor lately  Weight has fluctuated  Recent hospitalization with PE  No other significant issues at present Tries to get about 6 hours of sleep every night   Outpatient Encounter Medications as of 01/29/2019  Medication Sig  . apixaban (ELIQUIS) 5 MG TABS tablet Take 5 mg by mouth 2 (two) times daily.   Marland Kitchen testosterone cypionate (DEPOTESTOSTERONE CYPIONATE) 200 MG/ML injection Inject 1 mL into the muscle See admin instructions. Every 10 days  . varenicline (CHANTIX) 1 MG tablet Take 1 mg by mouth 2 (two) times daily.  . [DISCONTINUED] CHANTIX 1 MG tablet Take 1 mg by mouth 2 (two) times a day.  . [DISCONTINUED] Eliquis DVT/PE Starter Pack (ELIQUIS STARTER PACK) 5 MG TABS Take as directed on package: start with two-5mg  tablets twice daily for 7 days. On day 8, switch to one-5mg  tablet twice daily.   No facility-administered encounter medications on file as of 01/29/2019.     Allergies as of 01/29/2019  . (No Known Allergies)    Past Medical History:  Diagnosis Date  . Sleep apnea     Past Surgical History:  Procedure Laterality Date  . NASAL SEPTUM SURGERY    . ORIF ULNAR / RADIAL SHAFT FRACTURE      Family History  Problem Relation Age of Onset  . Diabetes Other        grandmother    Social History    Socioeconomic History  . Marital status: Divorced    Spouse name: Not on file  . Number of children: Y  . Years of education: Not on file  . Highest education level: Not on file  Occupational History  . Occupation: bus driver    Comment: for [employer]  . Financial resource strain: Not on file  . Food insecurity    Worry: Not on file    Inability: Not on file  . Transportation needs    Medical: Not on file    Non-medical: Not on file  Tobacco Use  . Smoking status: Former Smoker    Packs/day: 1.00    Years: 20.00    Pack years: 20.00    Types: Cigarettes  . Smokeless tobacco: Never Used  Substance and Sexual Activity  . Alcohol use: Yes    Alcohol/week: 0.0 standard drinks    Comment: occassionally  . Drug use: No  . Sexual activity: Not on file  Lifestyle  . Physical activity    Days per week: Not on file    Minutes per session: Not on file  . Stress: Not on file  Relationships  . Social Herbalist on phone: Not on file    Gets together: Not on file    Attends religious service: Not on file  Active member of club or organization: Not on file    Attends meetings of clubs or organizations: Not on file    Relationship status: Not on file  . Intimate partner violence    Fear of current or ex partner: Not on file    Emotionally abused: Not on file    Physically abused: Not on file    Forced sexual activity: Not on file  Other Topics Concern  . Not on file  Social History Narrative  . Not on file    Review of Systems  Constitutional: Negative.   HENT: Negative.   Eyes: Negative.   Respiratory: Positive for apnea.   Cardiovascular: Negative.   Gastrointestinal: Negative.   Psychiatric/Behavioral: Positive for sleep disturbance.  All other systems reviewed and are negative.   Vitals:   01/29/19 1419 01/29/19 1420  BP:  120/82  Pulse:  83  Temp: 97.6 F (36.4 C)   SpO2:  96%     Physical Exam  Constitutional: He appears  well-developed and well-nourished.  Obese  HENT:  Head: Normocephalic and atraumatic.  Eyes: Pupils are equal, round, and reactive to light. Conjunctivae are normal. Right eye exhibits no discharge.  Neck: Normal range of motion. Neck supple. No tracheal deviation present. No thyromegaly present.  Cardiovascular: Normal rate and regular rhythm.  Pulmonary/Chest: Effort normal and breath sounds normal. No respiratory distress. He has no wheezes. He has no rales. He exhibits no tenderness.  Abdominal: Soft. Bowel sounds are normal. He exhibits no distension. There is no abdominal tenderness.     Data Reviewed: Compliance data does reveal good compliance, AHI of 3.7, machine set at 11  Assessment:  Obstructive sleep apnea -Adequately treated with CPAP -Machine is becoming dysfunctional  Morbid obesity -Working on weight loss  PE -On anticoagulation and doing well  Plan/Recommendations: We will get a home sleep study  Encouraged to continue working on weight loss  Continue anticoagulation  I will see him back in the office at about 6 months following set up Was doing very well and compliant with CPAP use   Virl DiamondAdewale  MD [facility] 01/29/2019, 2:29 PM  CC: Salli QuarryZimmermann, Matt, PA-C

## 2019-01-29 NOTE — Patient Instructions (Signed)
History of obstructive sleep apnea with dysfunctional machine  We will set you up with a home sleep study Contact DME company once results available  We will see you back in the office in about 6 months following set up

## 2019-02-20 ENCOUNTER — Other Ambulatory Visit: Payer: Self-pay

## 2019-02-20 ENCOUNTER — Ambulatory Visit: Payer: BC Managed Care – PPO

## 2019-02-20 DIAGNOSIS — G4733 Obstructive sleep apnea (adult) (pediatric): Secondary | ICD-10-CM

## 2019-03-04 ENCOUNTER — Telehealth: Payer: Self-pay | Admitting: Pulmonary Disease

## 2019-03-04 DIAGNOSIS — G4733 Obstructive sleep apnea (adult) (pediatric): Secondary | ICD-10-CM

## 2019-03-04 NOTE — Telephone Encounter (Signed)
Dr. Ander Slade has reviewed the home sleep test this showed severe sleep apnea.   Recommendations   Treatment options are CPAP with the settings auto 5 to 20.    Weight loss measures .   Advise against driving while sleepy & against medication with sedative side effects.    Make appointment for 3 months for compliance with download with Dr. Ander Slade.   Called and spoke with Patient.  Dr. Judson Roch results and recommendations given.  Understanding stated. Patient scheduled 05/15/19 at 2pm for follow up. DME order placed.  Nothing further at this time.

## 2019-05-02 ENCOUNTER — Ambulatory Visit: Payer: BC Managed Care – PPO | Admitting: Physical Therapy

## 2019-05-08 ENCOUNTER — Ambulatory Visit: Payer: BC Managed Care – PPO | Attending: Physician Assistant | Admitting: Physical Therapy

## 2019-05-08 ENCOUNTER — Encounter: Payer: Self-pay | Admitting: Physical Therapy

## 2019-05-08 ENCOUNTER — Other Ambulatory Visit: Payer: Self-pay

## 2019-05-08 DIAGNOSIS — M25562 Pain in left knee: Secondary | ICD-10-CM | POA: Insufficient documentation

## 2019-05-08 DIAGNOSIS — G8929 Other chronic pain: Secondary | ICD-10-CM | POA: Diagnosis present

## 2019-05-08 NOTE — Therapy (Signed)
[facility] [address] [place], [place], [address] Phone: [phone]   Fax:  [phone]  Physical Therapy Evaluation  Patient Details  Name: Richard Osborn MRN: [id number] Date of Birth: 1975-06-13 Referring Provider (PT): Salli Quarry, [place]   Encounter Date: 05/08/2019  PT End of Session - 05/08/19 1402    Visit Number  1    Number of Visits  10    Date for PT Re-Evaluation  06/19/19    Authorization Type  BCBS    PT Start Time  1311    PT Stop Time  1355    PT Time Calculation (min)  44 min    Activity Tolerance  Patient tolerated treatment well    Behavior During Therapy  [facility] for tasks assessed/performed       Past Medical History:  Diagnosis Date  . Sleep apnea     Past Surgical History:  Procedure Laterality Date  . NASAL SEPTUM SURGERY    . ORIF ULNAR / RADIAL SHAFT FRACTURE      There were no vitals filed for this visit.   Subjective Assessment - 05/08/19 1305    Subjective  Pt. is a 43 y/o male referred to PT with c/o chronic left knee pain. He reports he gradually developed knee pain associated with playing ice hockey about 2 years ago-no specific incident of injury noted. He reports minimal pain with walking but has particular difficulty with stair navigation for ascending > descending with knee feeling unstable. Primary pain is inferior to patella and "deep" in the joint. He has also noted pain with eccentric motion such as with the lowering phase of using knee extension machine at the gym (since no longer performing) but no significant difficulty with squats.    Pertinent History  pulmonary embolism (on Eliquis), obesity, OSA, right LE cellulitis    Limitations  Standing;Walking;House hold activities;Lifting   stairs   Patient Stated Goals  Improve knee pain    Currently in Pain?  No/denies   6-7/10 at worst with stairs described as sharp in nature        [facility] PT Assessment - 05/08/19 0001       Assessment   Medical Diagnosis  Chronic left knee pain    Referring Provider (PT)  Salli Quarry, PA-C    Onset Date/Surgical Date  04/29/17   estimated per subjective report   Hand Dominance  Right    Prior Therapy  none for knee, past PT for forearm fracture      Precautions   Precautions  --    Precaution Comments  History PE on Eliquis      Restrictions   Weight Bearing Restrictions  No      Balance Screen   Has the patient fallen in the past 6 months  Yes    How many times?  1      Home Environment   Living Environment  Private residence    Living Arrangements  Non-relatives/Friends    Type of Home  House    Home Access  Stairs to enter    Entrance Stairs-Number of Steps  2    Entrance Stairs-Rails  None    Home Layout  Two level    Alternate Level Stairs-Number of Steps  --   1 flight without rails     Prior Function   Level of Independence  Independent with community mobility without device      Cognition   Overall Cognitive Status  Within Functional Limits for tasks assessed      Observation/Other Assessments   Focus on Therapeutic Outcomes (FOTO)   29% limited      Posture/Postural Control   Posture Comments  observed squat and step up-no significant issues with squat mechanics or associated pain with difficulty with step up to 6 in. step due to knee pain      ROM / Strength   AROM / PROM / Strength  AROM;PROM;Strength      AROM   AROM Assessment Site  Knee    Right/Left Knee  Right;Left    Right Knee Extension  0    Right Knee Flexion  114    Left Knee Extension  0    Left Knee Flexion  112      PROM   PROM Assessment Site  Knee    Right/Left Knee  Left      Strength   Strength Assessment Site  Hip;Knee    Right/Left Hip  Right;Left    Right Hip Flexion  5/5    Right Hip Extension  4+/5    Right Hip External Rotation   4+/5    Right Hip Internal Rotation  4+/5    Right Hip ABduction  5/5    Right Hip ADduction  5/5    Left Hip Flexion   5/5    Left Hip Extension  4+/5    Left Hip External Rotation  4/5    Left Hip Internal Rotation  4+/5    Left Hip ABduction  4+/5    Left Hip ADduction  5/5    Right/Left Knee  Right;Left    Right Knee Flexion  5/5    Right Knee Extension  5/5    Left Knee Flexion  5/5    Left Knee Extension  5/5      Flexibility   Soft Tissue Assessment /Muscle Length  --   hamstring tightness SLR 70 deg, mild quad tightness     Palpation   Palpation comment  Mild medial joint line tenderness, tender to palpation along patellar tendon with eccentric motion from LAQ but no TTP at rest      Special Tests   Other special tests  McMurray's (-), anterior and posterior drawer (-), varus and valgius stress tests (-)                Objective measurements completed on examination: See above findings.      [facility] Adult PT Treatment/Exercise - 05/08/19 0001      Exercises   Exercises  --   HEP handout review-see chart copy            PT Education - 05/08/19 1401    Education Details  Knee anatomy, potential symptom etiology, eval findings, HEP    Person(s) Educated  Patient    Methods  Explanation;Demonstration;Verbal cues;Handout    Comprehension  Verbalized understanding          PT Long Term Goals - 05/08/19 1407      PT LONG TERM GOAL #1   Title  Independent with HEP    Baseline  needs HEP    Time  6    Period  Weeks    Status  New    Target Date  06/19/19      PT LONG TERM GOAL #2   Title  Improve FOTO outcome measure score to 22% or less impairment    Baseline  29% limited    Time  6  Period  Weeks    Status  New    Target Date  06/19/19      PT LONG TERM GOAL #3   Title  Increase left hip strength to grossly 5/5 to improve LLE stability for stair navigation    Baseline  see objective    Time  6    Period  Weeks    Status  New    Target Date  06/19/19      PT LONG TERM GOAL #4   Title  Ascend/descend stairs at home to second story with reciprocal  gait with left knee pain <3/10    Baseline  pain up to 7/10    Time  6    Period  Weeks    Status  New    Target Date  06/19/19             Plan - 05/08/19 1402    Clinical Impression Statement  Pt. presents with left knee pain with hip>knee weakness, muscle tightness in quads and hamstrings. Differential diagnosis could include patellar tendinopathy vs. patellofemoral pain and/or OA-no specific injury noted to suggest fracture ot other pathology but if symptoms persist despite tx. efforts recommend follow up with referring provider for further assessment.    Personal Factors and Comorbidities  Comorbidity 1;Time since onset of injury/illness/exacerbation    Comorbidities  obesity    Examination-Activity Limitations  Stairs    Stability/Clinical Decision Making  Evolving/Moderate complexity    Clinical Decision Making  Moderate    Rehab Potential  Good    PT Frequency  --   1-2x/week   PT Duration  6 weeks    PT Treatment/Interventions  ADLs/Self Care Home Management;Cryotherapy;Ultrasound;Iontophoresis 4mg /ml Dexamethasone;Moist Heat;Therapeutic activities;Neuromuscular re-education;Functional mobility training;Patient/family education;Therapeutic exercise;Dry needling;Manual techniques;Taping    PT Next Visit Plan  review HEP as needed, consider [place] and/or ionto left patellar tendon region, quad stretches, open vs. closed chain strengthening but hold step ups until able without significant pain.    PT Home Exercise Plan  quad stretch, quad sets, supine SLR, hip bridge, hip abd SLR with Theraband, clamshell, hamstring stretch, standing TKE, squat    Consulted and Agree with Plan of Care  Patient       Patient will benefit from skilled therapeutic intervention in order to improve the following deficits and impairments:  Decreased strength, Pain, Impaired flexibility  Visit Diagnosis: Chronic pain of left knee     Problem List Patient Active Problem List   Diagnosis Date  Noted  . Bilateral pulmonary embolism ([place]) 11/19/2018  . ARF (acute renal failure) ([place]) 11/19/2018  . Alcohol abuse with alcohol-induced mood disorder ([place]) 11/22/2016  . Chronic alcoholism ([place])   . Hypotension 07/06/2015  . 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 Morbid obesity due to excess calories ([place]) 06/26/2015  . Sepsis ([place]) 06/26/2015  . Cellulitis 06/24/2015  . Cellulitis of right leg 06/24/2015  . Acute kidney injury ([place]) 06/24/2015  . Leucocytosis 06/24/2015  . OSA (obstructive sleep apnea) 09/23/2014    Beaulah Dinning, PT, DPT 05/08/19 2:09 PM  [facility] [address] [place], [place], [address] Phone: [phone]   Fax:  [phone]  Name: Richard Osborn MRN: [id number] Date of Birth: 07/20/1975

## 2019-05-15 ENCOUNTER — Other Ambulatory Visit: Payer: Self-pay

## 2019-05-15 ENCOUNTER — Encounter: Payer: Self-pay | Admitting: Pulmonary Disease

## 2019-05-15 ENCOUNTER — Telehealth: Payer: Self-pay | Admitting: Pulmonary Disease

## 2019-05-15 ENCOUNTER — Ambulatory Visit: Payer: BC Managed Care – PPO | Admitting: Pulmonary Disease

## 2019-05-15 VITALS — BP 118/76 | HR 75 | Temp 97.0°F | Ht 69.0 in | Wt 362.2 lb

## 2019-05-15 DIAGNOSIS — G4733 Obstructive sleep apnea (adult) (pediatric): Secondary | ICD-10-CM

## 2019-05-15 DIAGNOSIS — Z9989 Dependence on other enabling machines and devices: Secondary | ICD-10-CM | POA: Diagnosis not present

## 2019-05-15 NOTE — Patient Instructions (Signed)
Obstructive sleep apnea Good some compliance with CPAP  We will see you again in about 6 months  Call with any significant concerns

## 2019-05-15 NOTE — Telephone Encounter (Signed)
Per [facility], we have 54 days of available data on pt's cpap.  Spoke with pt to make aware of this, and advised that insurance requires a visit to our office between 31-90 days of initial start.  Pt will keep appt today at 2:00.  Nothing further needed at this time- will close encounter.

## 2019-05-15 NOTE — Progress Notes (Signed)
Richard Osborn    [id number]    14-Jul-1975  Primary Care Physician:Zimmermann, Donovan Kail  Referring Physician: Salli Quarry, PA-C [address] [place],  [place] [address]  Chief complaint:   Patient with a history of obstructive sleep apnea diagnosed about 18 years ago Has been using CPAP on a regular basis Recently had a home sleep study Started on auto titrating CPAP  HPI:  CPAP between 5-20  He feels well, symptoms well controlled Continues to notice significant improvement in symptoms with CPAP use  Diagnosed about age 29 Was compliant with CPAP use Did notice significant improvement in symptoms with CPAP use  Lately has not been able to use machine on a regular basis as the machine is falling apart  Denies any morning headaches Occasional dryness of his mouth in the mornings Memory has been poor lately  Weight has fluctuated  Recent hospitalization with PE  No other significant issues at present Tries to get about 6 hours of sleep every night   Outpatient Encounter Medications as of 05/15/2019  Medication Sig  . apixaban (ELIQUIS) 5 MG TABS tablet Take 5 mg by mouth 2 (two) times daily.   Marland Kitchen testosterone cypionate (DEPOTESTOSTERONE CYPIONATE) 200 MG/ML injection Inject 1 mL into the muscle See admin instructions. Every 10 days  . varenicline (CHANTIX) 1 MG tablet Take 1 mg by mouth 2 (two) times daily.   No facility-administered encounter medications on file as of 05/15/2019.    Allergies as of 05/15/2019  . (No Known Allergies)    Past Medical History:  Diagnosis Date  . Sleep apnea     Past Surgical History:  Procedure Laterality Date  . NASAL SEPTUM SURGERY    . ORIF ULNAR / RADIAL SHAFT FRACTURE      Family History  Problem Relation Age of Onset  . Diabetes Other        grandmother    Social History   Socioeconomic History  . Marital status: Divorced    Spouse name: Not on file  . Number of children: Y  . Years of  education: Not on file  . Highest education level: Not on file  Occupational History  . Occupation: bus driver    Comment: for [employer]  Tobacco Use  . Smoking status: Former Smoker    Packs/day: 1.00    Years: 20.00    Pack years: 20.00    Types: Cigarettes  . Smokeless tobacco: Never Used  Substance and Sexual Activity  . Alcohol use: Yes    Alcohol/week: 0.0 standard drinks    Comment: occassionally  . Drug use: No  . Sexual activity: Not on file  Other Topics Concern  . Not on file  Social History Narrative  . Not on file   Social Determinants of Health   Financial Resource Strain:   . Difficulty of Paying Living Expenses: Not on file  Food Insecurity:   . Worried About Programme researcher, broadcasting/film/video in the Last Year: Not on file  . Ran Out of Food in the Last Year: Not on file  Transportation Needs:   . Lack of Transportation (Medical): Not on file  . Lack of Transportation (Non-Medical): Not on file  Physical Activity:   . Days of Exercise per Week: Not on file  . Minutes of Exercise per Session: Not on file  Stress:   . Feeling of Stress : Not on file  Social Connections:   . Frequency of  Communication with Friends and Family: Not on file  . Frequency of Social Gatherings with Friends and Family: Not on file  . Attends Religious Services: Not on file  . Active Member of Clubs or Organizations: Not on file  . Attends Archivist Meetings: Not on file  . Marital Status: Not on file  Intimate Partner Violence:   . Fear of Current or Ex-Partner: Not on file  . Emotionally Abused: Not on file  . Physically Abused: Not on file  . Sexually Abused: Not on file    Review of Systems  Constitutional: Negative.   HENT: Negative.   Eyes: Negative.   Respiratory: Positive for apnea.   Cardiovascular: Negative.   Gastrointestinal: Negative.   Psychiatric/Behavioral: Positive for sleep disturbance.  All other systems reviewed and are negative.   Vitals:    05/15/19 1400  BP: 118/76  Pulse: 75  Temp: (!) 97 F (36.1 C)  SpO2: 97%     Physical Exam  Constitutional: He appears well-developed and well-nourished.  Obese  HENT:  Head: Normocephalic and atraumatic.  Eyes: Pupils are equal, round, and reactive to light. Conjunctivae are normal. Right eye exhibits no discharge.  Neck: No tracheal deviation present. No thyromegaly present.  Cardiovascular: Normal rate and regular rhythm.  Pulmonary/Chest: Effort normal and breath sounds normal. No respiratory distress. He has no wheezes.  Musculoskeletal:     Cervical back: Normal range of motion and neck supple.     Data Reviewed: Compliance data does reveal good compliance, AHI less than 3 AHI of 3  Assessment:  Obstructive sleep apnea -Adequately treated with CPAP -Tolerating new machine much better -Sleeping well, more energy  Morbid obesity -Working on weight loss -Has gained more weight since the pandemic  PE -On anticoagulation  Plan/Recommendations: Continue CPAP use  Encouraged to continue working on weight loss  Encouraged to keep using CPAP on a regular basis every night and every hour of sleep  Continue anticoagulation  We will see him back in the office in 6 months  Sherrilyn Rist MD [facility] 05/15/2019, 2:10 PM  CC: Edmonia James, PA-C

## 2019-05-17 ENCOUNTER — Encounter

## 2019-05-21 ENCOUNTER — Other Ambulatory Visit: Payer: Self-pay

## 2019-05-21 ENCOUNTER — Encounter: Payer: Self-pay | Admitting: Physical Therapy

## 2019-05-21 ENCOUNTER — Ambulatory Visit: Payer: BC Managed Care – PPO | Admitting: Physical Therapy

## 2019-05-21 DIAGNOSIS — M25562 Pain in left knee: Secondary | ICD-10-CM | POA: Diagnosis not present

## 2019-05-21 DIAGNOSIS — G8929 Other chronic pain: Secondary | ICD-10-CM

## 2019-05-21 NOTE — Therapy (Signed)
[facility] Outpatient Rehabilitation [facility] [address] [place], [place], [address] Phone: [phone]   Fax:  [phone]  Physical Therapy Treatment  Patient Details  Name: Richard Osborn MRN: [id number] Date of Birth: 02-Jul-1975 Referring Provider (PT): Salli Quarry, [place]   Encounter Date: 05/21/2019  PT End of Session - 05/21/19 1504    Visit Number  2    Number of Visits  10    Date for PT Re-Evaluation  06/19/19    Authorization Type  BCBS    PT Start Time  1446    PT Stop Time  1525    PT Time Calculation (min)  39 min    Activity Tolerance  Patient tolerated treatment well    Behavior During Therapy  [facility] for tasks assessed/performed       Past Medical History:  Diagnosis Date  . Sleep apnea     Past Surgical History:  Procedure Laterality Date  . NASAL SEPTUM SURGERY    . ORIF ULNAR / RADIAL SHAFT FRACTURE      There were no vitals filed for this visit.  Subjective Assessment - 05/21/19 1449    Subjective  Not in pain today, went to the gym and walked on the treadmill, about 30 min.    Currently in Pain?  No/denies         [facility] Adult PT Treatment/Exercise - 05/21/19 0001      Knee/Hip Exercises: Stretches   Active Hamstring Stretch  Left;3 reps    Active Hamstring Stretch Limitations  seated     Quad Stretch  Left;2 reps    Piriformis Stretch  Both;2 reps      Knee/Hip Exercises: Standing   Heel Raises  Both;1 set;20 reps    Hip Abduction  Stengthening;Both;1 set;15 reps    Abduction Limitations  on foam pad     Forward Step Up Limitations  done x 2 post tape, still pain severe     Step Down Limitations  mini step ups foam pad x 10 to SLS    Wall Squat  2 sets;10 reps    Wall Squat Limitations  worked on a more narrow squat, painful both knees     SLS with Vectors  hip abd x 15 each side       Knee/Hip Exercises: Supine   Bridges  Strengthening;Both;1 set;15 reps    Straight Leg Raises  Strengthening;Both;1 set;15  reps      Manual Therapy   Manual Therapy  Taping    McConnell  1 strip pulling patella medially            PT Long Term Goals - 05/21/19 1525      PT LONG TERM GOAL #1   Title  Independent with HEP    Baseline  has done minimally    Status  On-going      PT LONG TERM GOAL #2   Title  Improve FOTO outcome measure score to 22% or less impairment    Status  On-going      PT LONG TERM GOAL #3   Title  Increase left hip strength to grossly 5/5 to improve LLE stability for stair navigation    Status  On-going      PT LONG TERM GOAL #4   Title  Ascend/descend stairs at home to second story with reciprocal gait with left knee pain <3/10    Status  On-going            Plan -  05/21/19 1504    Clinical Impression Statement  Patient continues to have knee pain with extension (steps). Trial of tape (McConnell) did not improve pain although he was able to do a very small step up onto foam pad.  Controlling hip ER aggravted pain in squatting (wall). HE does not lie to lie flat due to breathing but can do for short periods.   Reminded him of the importance of an HEP to isolate muscle activation.    PT Treatment/Interventions  ADLs/Self Care Home Management;Cryotherapy;Ultrasound;Iontophoresis 4mg /ml Dexamethasone;Moist Heat;Therapeutic activities;Neuromuscular re-education;Functional mobility training;Patient/family education;Therapeutic exercise;Dry needling;Manual techniques;Taping    PT Next Visit Plan  ITB stretching, review HEP as needed, consider [place] and/or ionto left patellar tendon region, quad stretches, open vs. closed chain strengthening but hold step ups until able without significant pain.    PT Home Exercise Plan  quad stretch, quad sets, supine SLR, hip bridge, hip abd SLR with Theraband, clamshell, hamstring stretch, standing TKE, squat    Consulted and Agree with Plan of Care  Patient       Patient will benefit from skilled therapeutic intervention in order to improve  the following deficits and impairments:  Decreased strength, Pain, Impaired flexibility  Visit Diagnosis: Chronic pain of left knee     Problem List Patient Active Problem List   Diagnosis Date Noted  . Bilateral pulmonary embolism ([place]) 11/19/2018  . ARF (acute renal failure) ([place]) 11/19/2018  . Alcohol abuse with alcohol-induced mood disorder ([place]) 11/22/2016  . Chronic alcoholism ([place])   . Hypotension 07/06/2015  . 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 Morbid obesity due to excess calories ([place]) 06/26/2015  . Sepsis ([place]) 06/26/2015  . Cellulitis 06/24/2015  . Cellulitis of right leg 06/24/2015  . Acute kidney injury ([place]) 06/24/2015  . Leucocytosis 06/24/2015  . OSA (obstructive sleep apnea) 09/23/2014    Richard Osborn 05/21/2019, 7:51 PM  [facility] [address] [place], [place], [address] Phone: [phone]   Fax:  [phone]  Name: Richard Osborn MRN: [id number] Date of Birth: 18-Dec-1975  Raeford Razor, PT 05/21/19 7:51 PM Phone: [phone] Fax: [phone]

## 2019-05-27 ENCOUNTER — Encounter: Payer: Self-pay | Admitting: Physical Therapy

## 2019-05-27 ENCOUNTER — Other Ambulatory Visit: Payer: Self-pay

## 2019-05-27 ENCOUNTER — Ambulatory Visit: Payer: BC Managed Care – PPO | Admitting: Physical Therapy

## 2019-05-27 DIAGNOSIS — M25562 Pain in left knee: Secondary | ICD-10-CM | POA: Diagnosis not present

## 2019-05-27 DIAGNOSIS — G8929 Other chronic pain: Secondary | ICD-10-CM

## 2019-05-27 NOTE — Therapy (Signed)
[facility] Outpatient Rehabilitation [facility] [address] [place], [place], [address] Phone: [phone]   Fax:  [phone]  Physical Therapy Treatment  Patient Details  Name: Richard Osborn MRN: [id number] Date of Birth: 1976-05-01 Referring Provider (PT): Salli Quarry, [place]   Encounter Date: 05/27/2019  PT End of Session - 05/27/19 1717    Visit Number  3    Number of Visits  10    Date for PT Re-Evaluation  06/19/19    Authorization Type  BCBS    PT Start Time  1627    PT Stop Time  1708    PT Time Calculation (min)  41 min    Activity Tolerance  Patient tolerated treatment well   see plan-brief pain with [place] otherwise well-tolerated   Behavior During Therapy  [facility] for tasks assessed/performed       Past Medical History:  Diagnosis Date  . Sleep apnea     Past Surgical History:  Procedure Laterality Date  . NASAL SEPTUM SURGERY    . ORIF ULNAR / RADIAL SHAFT FRACTURE      There were no vitals filed for this visit.  Subjective Assessment - 05/27/19 1716    Subjective  No signficant benefit with taping but otherwise notes possible mild improvement with knee pain. Stairs remain primary cause of pain exacerbation but also reports had some pain at the gym with static partial squat held for bent over row exercise.    Patient Stated Goals  Improve knee pain                       [facility] Adult PT Treatment/Exercise - 05/27/19 0001      Knee/Hip Exercises: Stretches   Passive Hamstring Stretch  Left;3 reps;30 seconds    Quad Stretch  Left;3 reps;30 seconds    ITB Stretch  Left;3 reps;30 seconds      Knee/Hip Exercises: Standing   Other Standing Knee Exercises  HEP instruction and brief practice monster walks with black band proximal to knees x 15 feet      Knee/Hip Exercises: Supine   Bridges  AROM;Strengthening;2 sets;10 reps    Bridges Limitations  legs on 55 cm P-ball      Modalities   Modalities  Iontophoresis;Ultrasound       Ultrasound   Ultrasound Location  left patellar tendon    Ultrasound Parameters  1 MHZ 100% 1.0 W/cm2   see plan   Ultrasound Goals  Pain      Iontophoresis   Type of Iontophoresis  Dexamethasone    Location  left patellar tendon    Dose  4 mg/mL    Time  8   take home patch     Manual Therapy   Manual Therapy  Joint mobilization;Soft tissue mobilization    Joint Mobilization  patellar mobs grade I-II focus medial glides    Soft tissue mobilization  IASTM/foam roll left quad             PT Education - 05/27/19 1717    Education Details  iontophoresis, potential symptom etiology, POC    Person(s) Educated  Patient    Methods  Explanation    Comprehension  Verbalized understanding          PT Long Term Goals - 05/21/19 1525      PT LONG TERM GOAL #1   Title  Independent with HEP    Baseline  has done minimally    Status  On-going  PT LONG TERM GOAL #2   Title  Improve FOTO outcome measure score to 22% or less impairment    Status  On-going      PT LONG TERM GOAL #3   Title  Increase left hip strength to grossly 5/5 to improve LLE stability for stair navigation    Status  On-going      PT LONG TERM GOAL #4   Title  Ascend/descend stairs at home to second story with reciprocal gait with left knee pain <3/10    Status  On-going            Plan - 05/27/19 1718    Clinical Impression Statement  More focus modalities to inferior aspect of left knee/patellar tendon region. Trial [place] which was initially well-tolerated but pt. reported pain at 6 min so stopped-uncertain if due to settings vs. possible concern for underlying fracture due to pain with [place] so will continue to monitor/no recent trauma noted. Also trial iontophoresis today to left patellar tendon region-differential diagnosis given location could include patellar tendinopathy vs. intrinsic joint issue so monitoring of status will be ongoing.    Personal Factors and Comorbidities  Comorbidity  1;Time since onset of injury/illness/exacerbation    Comorbidities  obesity    Examination-Activity Limitations  Stairs    Stability/Clinical Decision Making  Evolving/Moderate complexity    Clinical Decision Making  Moderate    Rehab Potential  Good    PT Frequency  --   1-2x/week   PT Duration  6 weeks    PT Treatment/Interventions  ADLs/Self Care Home Management;Cryotherapy;Ultrasound;Iontophoresis 4mg /ml Dexamethasone;Moist Heat;Therapeutic activities;Neuromuscular re-education;Functional mobility training;Patient/family education;Therapeutic exercise;Dry needling;Manual techniques;Taping    PT Next Visit Plan  hold [place] given pain with this today, check response ionto and continue as found beneficial, continue knee/hip stretches and strengthening as tolerated    PT Home Exercise Plan  quad stretch, quad sets, supine SLR, hip bridge, hip abd SLR with Theraband, clamshell, hamstring stretch, standing TKE, squat    Consulted and Agree with Plan of Care  Patient       Patient will benefit from skilled therapeutic intervention in order to improve the following deficits and impairments:  Decreased strength, Pain, Impaired flexibility  Visit Diagnosis: Chronic pain of left knee     Problem List Patient Active Problem List   Diagnosis Date Noted  . Bilateral pulmonary embolism ([place]) 11/19/2018  . ARF (acute renal failure) ([place]) 11/19/2018  . Alcohol abuse with alcohol-induced mood disorder ([place]) 11/22/2016  . Chronic alcoholism ([place])   . Hypotension 07/06/2015  . 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 Morbid obesity due to excess calories ([place]) 06/26/2015  . Sepsis ([place]) 06/26/2015  . Cellulitis 06/24/2015  . Cellulitis of right leg 06/24/2015  . Acute kidney injury ([place]) 06/24/2015  . Leucocytosis 06/24/2015  . OSA (obstructive sleep apnea) 09/23/2014    Beaulah Dinning, PT, DPT 05/27/19 5:24 PM  [place] [address] [place], [place], [address] Phone: [phone]   Fax:  [phone]  Name: Richard Osborn MRN: [id number] Date of Birth: 01/06/1976

## 2019-05-30 ENCOUNTER — Ambulatory Visit: Payer: BC Managed Care – PPO | Admitting: Physical Therapy

## 2019-05-30 ENCOUNTER — Other Ambulatory Visit: Payer: Self-pay

## 2019-05-30 ENCOUNTER — Encounter: Payer: Self-pay | Admitting: Physical Therapy

## 2019-05-30 DIAGNOSIS — G8929 Other chronic pain: Secondary | ICD-10-CM

## 2019-05-30 DIAGNOSIS — M25562 Pain in left knee: Secondary | ICD-10-CM

## 2019-05-30 NOTE — Therapy (Signed)
[facility] Outpatient Rehabilitation [facility] [address] [place], [place], [address] Phone: [phone]   Fax:  [phone]  Physical Therapy Treatment  Patient Details  Name: Richard Osborn MRN: [id number] Date of Birth: 1975-12-30 Referring Provider (PT): Salli Quarry, [place]   Encounter Date: 05/30/2019  PT End of Session - 05/30/19 1403    Visit Number  4    Number of Visits  10    Date for PT Re-Evaluation  06/19/19    Authorization Type  BCBS    PT Start Time  1333    PT Stop Time  1357   session abbreviated-see subjective   PT Time Calculation (min)  24 min    Activity Tolerance  Patient tolerated treatment well    Behavior During Therapy  [facility] for tasks assessed/performed       Past Medical History:  Diagnosis Date  . Sleep apnea     Past Surgical History:  Procedure Laterality Date  . NASAL SEPTUM SURGERY    . ORIF ULNAR / RADIAL SHAFT FRACTURE      There were no vitals filed for this visit.  Subjective Assessment - 05/30/19 1359    Subjective  Pt. reports ionto patch seemed to help. He reports almost cancelled visit today due to stomach issues and requests passive tx. only/avoid exercises.    Currently in Pain?  No/denies                       [facility] Adult PT Treatment/Exercise - 05/30/19 0001      Knee/Hip Exercises: Stretches   Quad Stretch  Left;3 reps;30 seconds      Iontophoresis   Type of Iontophoresis  Dexamethasone    Location  left patellar tendon    Dose  4 mg/mL    Time  8   take home patch     Manual Therapy   Manual Therapy  Joint mobilization;Soft tissue mobilization    Joint Mobilization  patellar mobs grade I-II focus medial glides    Soft tissue mobilization  IASTM/foam roll left quad             PT Education - 05/30/19 1402    Education Details  POC    Person(s) Educated  Patient    Methods  Explanation    Comprehension  Verbalized understanding          PT Long Term Goals  - 05/21/19 1525      PT LONG TERM GOAL #1   Title  Independent with HEP    Baseline  has done minimally    Status  On-going      PT LONG TERM GOAL #2   Title  Improve FOTO outcome measure score to 22% or less impairment    Status  On-going      PT LONG TERM GOAL #3   Title  Increase left hip strength to grossly 5/5 to improve LLE stability for stair navigation    Status  On-going      PT LONG TERM GOAL #4   Title  Ascend/descend stairs at home to second story with reciprocal gait with left knee pain <3/10    Status  On-going            Plan - 05/30/19 1403    Clinical Impression Statement  Positive response to trial ionto to patellar tendon region so continued with another application. Tx. focus passive today per pt. request/abbreviated session due to not performing exercises. Mild  improvement from previous/baseline status but still with functional limitations for stairs.    Personal Factors and Comorbidities  Comorbidity 1;Time since onset of injury/illness/exacerbation    Comorbidities  obesity    Examination-Activity Limitations  Stairs    Stability/Clinical Decision Making  Evolving/Moderate complexity    Clinical Decision Making  Moderate    Rehab Potential  Good    PT Frequency  --   1-2x/week   PT Duration  6 weeks    PT Treatment/Interventions  ADLs/Self Care Home Management;Cryotherapy;Ultrasound;Iontophoresis 4mg /ml Dexamethasone;Moist Heat;Therapeutic activities;Neuromuscular re-education;Functional mobility training;Patient/family education;Therapeutic exercise;Dry needling;Manual techniques;Taping    PT Next Visit Plan  continue ionto, manual, stretches, progress knee and hip strengthening as tolerated pending pain, hold [place]    PT Home Exercise Plan  quad stretch, quad sets, supine SLR, hip bridge, hip abd SLR with Theraband, clamshell, hamstring stretch, standing TKE, squat    Consulted and Agree with Plan of Care  Patient       Patient will benefit from  skilled therapeutic intervention in order to improve the following deficits and impairments:  Decreased strength, Pain, Impaired flexibility  Visit Diagnosis: Chronic pain of left knee     Problem List Patient Active Problem List   Diagnosis Date Noted  . Bilateral pulmonary embolism ([place]) 11/19/2018  . ARF (acute renal failure) ([place]) 11/19/2018  . Alcohol abuse with alcohol-induced mood disorder ([place]) 11/22/2016  . Chronic alcoholism ([place])   . Hypotension 07/06/2015  . 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 Morbid obesity due to excess calories ([place]) 06/26/2015  . Sepsis ([place]) 06/26/2015  . Cellulitis 06/24/2015  . Cellulitis of right leg 06/24/2015  . Acute kidney injury ([place]) 06/24/2015  . Leucocytosis 06/24/2015  . OSA (obstructive sleep apnea) 09/23/2014    Beaulah Dinning, PT, DPT 05/30/19 2:07 PM  [place] [facility] [address] [place], [place], [address] Phone: [phone]   Fax:  [phone]  Name: Richard Osborn MRN: [id number] Date of Birth: [birth year]

## 2019-06-03 ENCOUNTER — Ambulatory Visit: Payer: BC Managed Care – PPO | Attending: Physician Assistant | Admitting: Physical Therapy

## 2019-06-03 ENCOUNTER — Telehealth: Payer: Self-pay | Admitting: Physical Therapy

## 2019-06-03 DIAGNOSIS — M25562 Pain in left knee: Secondary | ICD-10-CM | POA: Insufficient documentation

## 2019-06-03 DIAGNOSIS — G8929 Other chronic pain: Secondary | ICD-10-CM | POA: Insufficient documentation

## 2019-06-03 NOTE — Telephone Encounter (Signed)
Called and spoke with patient regarding missed appointment this PM-he reports forgot appointment. Confirmed next appointment 06/05/19.

## 2019-06-05 ENCOUNTER — Ambulatory Visit: Payer: Self-pay | Admitting: Physical Therapy

## 2019-06-05 ENCOUNTER — Other Ambulatory Visit: Payer: Self-pay

## 2019-06-05 ENCOUNTER — Encounter: Payer: Self-pay | Admitting: Physical Therapy

## 2019-06-05 DIAGNOSIS — G8929 Other chronic pain: Secondary | ICD-10-CM

## 2019-06-05 NOTE — Therapy (Signed)
[facility] Outpatient Rehabilitation [facility] [address] [place], [place], [address] Phone: [phone]   Fax:  [phone]  Physical Therapy Treatment  Patient Details  Name: Richard Osborn MRN: [id number] Date of Birth: 26-Oct-1975 Referring Provider (PT): Salli Quarry, [place]   Encounter Date: 06/05/2019  PT End of Session - 06/05/19 1543    Visit Number  5    Number of Visits  10    Date for PT Re-Evaluation  06/19/19    Authorization Type  BCBS    PT Start Time  1501    PT Stop Time  1539    PT Time Calculation (min)  38 min    Activity Tolerance  Patient tolerated treatment well    Behavior During Therapy  [facility] for tasks assessed/performed       Past Medical History:  Diagnosis Date  . Sleep apnea     Past Surgical History:  Procedure Laterality Date  . NASAL SEPTUM SURGERY    . ORIF ULNAR / RADIAL SHAFT FRACTURE      There were no vitals filed for this visit.  Subjective Assessment - 06/05/19 1503    Subjective  Only pain issue with knee is still having pain with stairs.    Currently in Pain?  No/denies                       [facility] Adult PT Treatment/Exercise - 06/05/19 0001      Exercises   Exercises  Knee/Hip      Knee/Hip Exercises: Stretches   Quad Stretch  Left;3 reps;30 seconds      Knee/Hip Exercises: Aerobic   Recumbent Bike  L2 x 5 min      Knee/Hip Exercises: Standing   Other Standing Knee Exercises  squat from decline with heels on 2" step-brief practice and HEP instruction      Knee/Hip Exercises: Seated   Long Arc Quad  AROM;Strengthening;Left;2 sets;10 reps    Long Arc Quad Limitations  eccentrics with blue Theraband      Iontophoresis   Type of Iontophoresis  Dexamethasone    Location  left patellar tendon    Dose  4 mg/mL    Time  8   take home patch     Manual Therapy   Joint Mobilization  patellar mobs grade I-II focus medial glides    Soft tissue mobilization  IASTM/foam roll left quad              PT Education - 06/05/19 1542    Education Details  knee anatomy/potential symptom etiology, HEP, POC    Person(s) Educated  Patient    Methods  Explanation;Demonstration;Verbal cues    Comprehension  Verbalized understanding;Returned demonstration          PT Long Term Goals - 05/21/19 1525      PT LONG TERM GOAL #1   Title  Independent with HEP    Baseline  has done minimally    Status  On-going      PT LONG TERM GOAL #2   Title  Improve FOTO outcome measure score to 22% or less impairment    Status  On-going      PT LONG TERM GOAL #3   Title  Increase left hip strength to grossly 5/5 to improve LLE stability for stair navigation    Status  On-going      PT LONG TERM GOAL #4   Title  Ascend/descend stairs at home to second  story with reciprocal gait with left knee pain <3/10    Status  On-going            Plan - 06/05/19 1543    Clinical Impression Statement  Fair progress with therapy with continued pain with stairs. Still somewhat unclear potential etiology with potential patellofemoral pain vs. patellar tendinopathy. Plan continue POC for now but ultimately if not having further improvement in the next several visits plan pt. to follow up with MD for further assessment.    Personal Factors and Comorbidities  Comorbidity 1;Time since onset of injury/illness/exacerbation    Comorbidities  obesity    Examination-Activity Limitations  Stairs    Stability/Clinical Decision Making  Evolving/Moderate complexity    Clinical Decision Making  Moderate    Rehab Potential  Good    PT Frequency  --   1-2x/week   PT Duration  6 weeks    PT Treatment/Interventions  ADLs/Self Care Home Management;Cryotherapy;Ultrasound;Iontophoresis 4mg /ml Dexamethasone;Moist Heat;Therapeutic activities;Neuromuscular re-education;Functional mobility training;Patient/family education;Therapeutic exercise;Dry needling;Manual techniques;Taping    PT Next Visit Plan  continue  ionto, manual, stretches, progress knee and hip strengthening as tolerated pending pain, hold [place]    PT Home Exercise Plan  quad stretch, quad sets, supine SLR, hip bridge, hip abd SLR with Theraband, clamshell, hamstring stretch, standing TKE, squat    Consulted and Agree with Plan of Care  Patient       Patient will benefit from skilled therapeutic intervention in order to improve the following deficits and impairments:  Decreased strength, Pain, Impaired flexibility  Visit Diagnosis: Chronic pain of left knee     Problem List Patient Active Problem List   Diagnosis Date Noted  . Bilateral pulmonary embolism ([place]) 11/19/2018  . ARF (acute renal failure) ([place]) 11/19/2018  . Alcohol abuse with alcohol-induced mood disorder ([place]) 11/22/2016  . Chronic alcoholism ([place])   . Hypotension 07/06/2015  . 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 Morbid obesity due to excess calories ([place]) 06/26/2015  . Sepsis ([place]) 06/26/2015  . Cellulitis 06/24/2015  . Cellulitis of right leg 06/24/2015  . Acute kidney injury ([place]) 06/24/2015  . Leucocytosis 06/24/2015  . OSA (obstructive sleep apnea) 09/23/2014    Beaulah Dinning, PT, DPT 06/05/19 3:46 PM  [place] [facility] [address] [place], [place], [address] Phone: [phone]   Fax:  [phone]  Name: Richard Osborn MRN: [id number] Date of Birth: 12-28-1975

## 2019-06-10 ENCOUNTER — Ambulatory Visit: Payer: BC Managed Care – PPO | Admitting: Physical Therapy

## 2019-06-10 ENCOUNTER — Encounter: Payer: Self-pay | Admitting: Physical Therapy

## 2019-06-10 ENCOUNTER — Other Ambulatory Visit: Payer: Self-pay

## 2019-06-10 DIAGNOSIS — M25562 Pain in left knee: Secondary | ICD-10-CM

## 2019-06-10 DIAGNOSIS — G8929 Other chronic pain: Secondary | ICD-10-CM

## 2019-06-10 NOTE — Therapy (Signed)
[place], [place], [address] Phone: [phone]   Fax:  [phone]  Physical Therapy Treatment  Patient Details  Name: Richard Osborn MRN: [id number] Date of Birth: Feb 19, 1976 Referring Provider (PT): Edmonia James, [place]   Encounter Date: 06/10/2019  PT End of Session - 06/10/19 1456    Visit Number  6    Number of Visits  10    Date for PT Re-Evaluation  06/19/19    Authorization Type  [employer]    PT Start Time  4627    PT Stop Time  1453    PT Time Calculation (min)  38 min    Activity Tolerance  Patient tolerated treatment well    Behavior During Therapy  [facility] for tasks assessed/performed       Past Medical History:  Diagnosis Date  . Sleep apnea     Past Surgical History:  Procedure Laterality Date  . NASAL SEPTUM SURGERY    . ORIF ULNAR / RADIAL SHAFT FRACTURE      There were no vitals filed for this visit.  Subjective Assessment - 06/10/19 1418    Subjective  No knee pain pre-tx. but hasn't "tested" knee with stairs since last week.    Pertinent History  pulmonary embolism (on Eliquis), obesity, OSA, right LE cellulitis    Currently in Pain?  No/denies         [facility] PT Assessment - 06/10/19 0001      Strength   Left Knee Flexion  5/5    Left Knee Extension  5/5                   [facility] Adult PT Treatment/Exercise - 06/10/19 0001      Knee/Hip Exercises: Stretches   Quad Stretch  Left;3 reps;30 seconds      Knee/Hip Exercises: Aerobic   Recumbent Bike  L2 x 5 min      Knee/Hip Exercises: Machines for Strengthening   Cybex Leg Press  Bilat. "press" with unilat. eccentric lowering on left 60 lbs. 2x10      Knee/Hip Exercises: Standing   Functional Squat Limitations  TRX abd squat with blue band 2x10 eccentric emphasis      Knee/Hip Exercises: Seated   Long Arc Quad  AROM;Strengthening;Left;2 sets;10 reps    Long Arc Quad Weight  5 lbs.    Long Therapist, sports with RLE assist concentric      Iontophoresis   Type of Iontophoresis  Dexamethasone    Location  left patellar tendon    Dose  4 mg/mL    Time  8   take home patch     Manual Therapy   Joint Mobilization  patellar mobs grade I-II focus medial glides    Soft tissue mobilization  IASTM/foam roll left quad             PT Education - 06/10/19 1455    Education Details  POC    Person(s) Educated  Patient    Methods  Explanation    Comprehension  Verbalized understanding          PT Long Term Goals - 05/21/19 1525      PT LONG TERM GOAL #1   Title  Independent with HEP    Baseline  has done minimally    Status  On-going      PT LONG TERM GOAL #2   Title  Improve FOTO outcome measure score to  22% or less impairment    Status  On-going      PT LONG TERM GOAL #3   Title  Increase left hip strength to grossly 5/5 to improve LLE stability for stair navigation    Status  On-going      PT LONG TERM GOAL #4   Title  Ascend/descend stairs at home to second story with reciprocal gait with left knee pain <3/10    Status  On-going            Plan - 06/10/19 1456    Clinical Impression Statement  Doing well in terms of minimal symptoms but as noted in subjective limited recent stair navigation to "test" status. Discussed to try stairs again before next visit to assess progress/tx/ effect. See plan below.    Personal Factors and Comorbidities  Comorbidity 1;Time since onset of injury/illness/exacerbation    Comorbidities  obesity    Examination-Activity Limitations  Stairs    Stability/Clinical Decision Making  Evolving/Moderate complexity    Clinical Decision Making  Moderate    Rehab Potential  Good    PT Frequency  --   1-2x/week   PT Treatment/Interventions  ADLs/Self Care Home Management;Cryotherapy;Ultrasound;Iontophoresis 4mg /ml Dexamethasone;Moist Heat;Therapeutic activities;Neuromuscular re-education;Functional mobility  training;Patient/family education;Therapeutic exercise;Dry needling;Manual techniques;Taping    PT Next Visit Plan  pending status possible d/c to HEP next visit with pt. to follow up with MD for further assessment if pain and associated limitations continue, continue ionto, manual, stretches, progress knee and hip strengthening as tolerated pending pain, hold    PT Home Exercise Plan  quad stretch, quad sets, supine SLR, hip bridge, hip abd SLR with Theraband, clamshell, hamstring stretch, standing TKE, squat    Consulted and Agree with Plan of Care  Patient       Patient will benefit from skilled therapeutic intervention in order to improve the following deficits and impairments:  Decreased strength, Pain, Impaired flexibility  Visit Diagnosis: Chronic pain of left knee     Problem List Patient Active Problem List   Diagnosis Date Noted  . Bilateral pulmonary embolism (HCC) 11/19/2018  . ARF (acute renal failure) (HCC) 11/19/2018  . Alcohol abuse with alcohol-induced mood disorder (HCC) 11/22/2016  . Chronic alcoholism (HCC)   . Hypotension 07/06/2015  . 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 Morbid obesity due to excess calories (HCC) 06/26/2015  . Sepsis (HCC) 06/26/2015  . Cellulitis 06/24/2015  . Cellulitis of right leg 06/24/2015  . Acute kidney injury (HCC) 06/24/2015  . Leucocytosis 06/24/2015  . OSA (obstructive sleep apnea) 09/23/2014    09/25/2014, PT, DPT 06/10/19 2:59 PM  [facility] [address] [place], [place], [place] Phone: [phone]   Fax:  [phone]  Name: Richard Osborn MRN: [name] Date of Birth: Dec 08, 1975

## 2019-06-12 ENCOUNTER — Ambulatory Visit: Payer: BC Managed Care – PPO | Admitting: Physical Therapy

## 2019-07-11 NOTE — Therapy (Signed)
[place], [place], [address] Phone: [phone]   Fax:  [phone]  Physical Therapy Treatment/Discharge  Patient Details  Name: Richard Osborn MRN: [id number] Date of Birth: [birth year] Referring Provider (PT): Edmonia James, [place]   Encounter Date: 06/10/2019    Past Medical History:  Diagnosis Date  . Sleep apnea     Past Surgical History:  Procedure Laterality Date  . NASAL SEPTUM SURGERY    . ORIF ULNAR / RADIAL SHAFT FRACTURE      There were no vitals filed for this visit.                                 PT Long Term Goals - 05/21/19 1525      PT LONG TERM GOAL #1   Title  Independent with HEP    Baseline  has done minimally    Status  On-going      PT LONG TERM GOAL #2   Title  Improve FOTO outcome measure score to 22% or less impairment    Status  On-going      PT LONG TERM GOAL #3   Title  Increase left hip strength to grossly 5/5 to improve LLE stability for stair navigation    Status  On-going      PT LONG TERM GOAL #4   Title  Ascend/descend stairs at home to second story with reciprocal gait with left knee pain <3/10    Status  On-going              Patient will benefit from skilled therapeutic intervention in order to improve the following deficits and impairments:  Decreased strength, Pain, Impaired flexibility  Visit Diagnosis: Chronic pain of left knee     Problem List Patient Active Problem List   Diagnosis Date Noted  . Bilateral pulmonary embolism ([place]) 11/19/2018  . ARF (acute renal failure) ([place]) 11/19/2018  . Alcohol abuse with alcohol-induced mood disorder ([place]) 11/22/2016  . Chronic alcoholism ([place])   . Hypotension 07/06/2015  . 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 Morbid obesity due to excess calories ([place]) 06/26/2015  . Sepsis ([place]) 06/26/2015  . Cellulitis 06/24/2015  . Cellulitis of right  leg 06/24/2015  . Acute kidney injury ([place]) 06/24/2015  . Leucocytosis 06/24/2015  . OSA (obstructive sleep apnea) 09/23/2014        PHYSICAL THERAPY DISCHARGE SUMMARY  Visits from Start of Care: 6  Current functional level related to goals / functional outcomes: Patient did not return for further therapy after 06/10/19. Last scheduled session was cancelled so unable to update objective status.   Remaining deficits: Status unknown   Education / Equipment: HEP Plan: Patient agrees to discharge.  Patient goals were not met. Patient is being discharged due to not returning since the last visit.  ?????          Beaulah Dinning, PT, DPT 07/11/19 3:50 PM    [facility] [address] [place], [place], [address] Phone: [phone]   Fax:  [phone]  Name: Richard Osborn MRN: [id number] Date of Birth: 1975/12/24

## 2020-06-22 ENCOUNTER — Emergency Department (HOSPITAL_COMMUNITY)
Admission: EM | Admit: 2020-06-22 | Discharge: 2020-06-22 | Disposition: A | Discharge status: Home / Self Care | Payer: Self-pay | Attending: Emergency Medicine | Admitting: Emergency Medicine

## 2020-06-22 ENCOUNTER — Ambulatory Visit: Payer: Self-pay

## 2020-06-22 ENCOUNTER — Emergency Department (HOSPITAL_COMMUNITY): Payer: Self-pay

## 2020-06-22 ENCOUNTER — Other Ambulatory Visit: Payer: Self-pay

## 2020-06-22 ENCOUNTER — Other Ambulatory Visit: Payer: Self-pay | Admitting: Family Medicine

## 2020-06-22 ENCOUNTER — Encounter (HOSPITAL_COMMUNITY): Payer: Self-pay | Admitting: *Deleted

## 2020-06-22 DIAGNOSIS — S129XXA Fracture of neck, unspecified, initial encounter: Secondary | ICD-10-CM | POA: Insufficient documentation

## 2020-06-22 DIAGNOSIS — W01198A Fall on same level from slipping, tripping and stumbling with subsequent striking against other object, initial encounter: Secondary | ICD-10-CM | POA: Insufficient documentation

## 2020-06-22 DIAGNOSIS — M542 Cervicalgia: Secondary | ICD-10-CM

## 2020-06-22 DIAGNOSIS — Y9301 Activity, walking, marching and hiking: Secondary | ICD-10-CM | POA: Insufficient documentation

## 2020-06-22 DIAGNOSIS — S0990XA Unspecified injury of head, initial encounter: Secondary | ICD-10-CM | POA: Insufficient documentation

## 2020-06-22 DIAGNOSIS — Z5321 Procedure and treatment not carried out due to patient leaving prior to being seen by health care provider: Secondary | ICD-10-CM | POA: Insufficient documentation

## 2020-06-22 DIAGNOSIS — S59902A Unspecified injury of left elbow, initial encounter: Secondary | ICD-10-CM | POA: Insufficient documentation

## 2020-06-22 NOTE — ED Triage Notes (Signed)
Pt was walking down a slippery sidewalk to pick up a patient for transport.  He slipped and fell backwards hitting the back of his head, heard his neck pop, and injury left elbow.  Pt denies LOC or blood thinners.  Pt has no numbness or tingling of extremities and states pain in neck increases with movement.  Diagnostic results question fracture to neck and showed to Dr. Particia Nearing.  Pt has been placed in [place] J collar and EDP ordered CTs

## 2020-06-22 NOTE — ED Notes (Signed)
Patient called x3 for vitals recheck with no response 

## 2020-09-14 ENCOUNTER — Emergency Department (HOSPITAL_COMMUNITY)
Admission: EM | Admit: 2020-09-14 | Discharge: 2020-09-15 | Disposition: A | Discharge status: Other Acute Inpatient Hospital | Payer: BC Managed Care – PPO | Source: Home / Self Care | Attending: Emergency Medicine | Admitting: Emergency Medicine

## 2020-09-14 ENCOUNTER — Encounter (HOSPITAL_COMMUNITY): Payer: Self-pay

## 2020-09-14 ENCOUNTER — Other Ambulatory Visit: Payer: Self-pay

## 2020-09-14 DIAGNOSIS — Z7901 Long term (current) use of anticoagulants: Secondary | ICD-10-CM | POA: Insufficient documentation

## 2020-09-14 DIAGNOSIS — Z20822 Contact with and (suspected) exposure to covid-19: Secondary | ICD-10-CM | POA: Insufficient documentation

## 2020-09-14 DIAGNOSIS — R45851 Suicidal ideations: Secondary | ICD-10-CM | POA: Insufficient documentation

## 2020-09-14 DIAGNOSIS — F1010 Alcohol abuse, uncomplicated: Secondary | ICD-10-CM

## 2020-09-14 DIAGNOSIS — F32A Depression, unspecified: Secondary | ICD-10-CM

## 2020-09-14 DIAGNOSIS — Y908 Blood alcohol level of 240 mg/100 ml or more: Secondary | ICD-10-CM | POA: Insufficient documentation

## 2020-09-14 DIAGNOSIS — N182 Chronic kidney disease, stage 2 (mild): Secondary | ICD-10-CM | POA: Insufficient documentation

## 2020-09-14 DIAGNOSIS — Z87891 Personal history of nicotine dependence: Secondary | ICD-10-CM | POA: Insufficient documentation

## 2020-09-14 DIAGNOSIS — F329 Major depressive disorder, single episode, unspecified: Secondary | ICD-10-CM | POA: Insufficient documentation

## 2020-09-14 DIAGNOSIS — F1020 Alcohol dependence, uncomplicated: Secondary | ICD-10-CM | POA: Insufficient documentation

## 2020-09-14 DIAGNOSIS — R4589 Other symptoms and signs involving emotional state: Secondary | ICD-10-CM

## 2020-09-14 HISTORY — DX: Depression, unspecified: F32.A

## 2020-09-14 LAB — COMPREHENSIVE METABOLIC PANEL
ALT: 110 U/L — ABNORMAL HIGH (ref 0–44)
AST: 58 U/L — ABNORMAL HIGH (ref 15–41)
Albumin: 3.3 g/dL — ABNORMAL LOW (ref 3.5–5.0)
Alkaline Phosphatase: 67 U/L (ref 38–126)
Anion gap: 12 (ref 5–15)
BUN: 8 mg/dL (ref 6–20)
CO2: 25 mmol/L (ref 22–32)
Calcium: 8.2 mg/dL — ABNORMAL LOW (ref 8.9–10.3)
Chloride: 109 mmol/L (ref 98–111)
Creatinine, Ser: 1.06 mg/dL (ref 0.61–1.24)
GFR, Estimated: >60 mL/min (ref >60)
Glucose, Bld: 82 mg/dL (ref 70–99)
Potassium: 3.7 mmol/L (ref 3.5–5.1)
Sodium: 146 mmol/L — ABNORMAL HIGH (ref 135–145)
Total Bilirubin: 1 mg/dL (ref 0.3–1.2)
Total Protein: 6.8 g/dL (ref 6.5–8.1)

## 2020-09-14 LAB — RESP PANEL BY RT-PCR (FLU A&B, COVID) ARPGX2
Influenza A by PCR: NEGATIVE
Influenza B by PCR: NEGATIVE
SARS Coronavirus 2 by RT PCR: NEGATIVE

## 2020-09-14 LAB — CBC WITH DIFFERENTIAL/PLATELET
Abs Immature Granulocytes: 0.02 10*3/uL (ref 0.00–0.07)
Basophils Absolute: 0.1 10*3/uL (ref 0.0–0.1)
Basophils Relative: 2 %
Eosinophils Absolute: 0 10*3/uL (ref 0.0–0.5)
Eosinophils Relative: 0 %
HCT: 49 % (ref 39.0–52.0)
Hemoglobin: 16.7 g/dL (ref 13.0–17.0)
Immature Granulocytes: 0 %
Lymphocytes Relative: 37 %
Lymphs Abs: 1.7 10*3/uL (ref 0.7–4.0)
MCH: 36.9 pg — ABNORMAL HIGH (ref 26.0–34.0)
MCHC: 34.1 g/dL (ref 30.0–36.0)
MCV: 108.2 fL — ABNORMAL HIGH (ref 80.0–100.0)
Monocytes Absolute: 0.8 10*3/uL (ref 0.1–1.0)
Monocytes Relative: 18 %
Neutro Abs: 1.9 10*3/uL (ref 1.7–7.7)
Neutrophils Relative %: 43 %
Platelets: 248 10*3/uL (ref 150–400)
RBC: 4.53 MIL/uL (ref 4.22–5.81)
RDW: 13.5 % (ref 11.5–15.5)
WBC: 4.5 10*3/uL (ref 4.0–10.5)
nRBC: 0 % (ref 0.0–0.2)

## 2020-09-14 LAB — ETHANOL: Alcohol, Ethyl (B): 350 mg/dL (ref <10)

## 2020-09-14 LAB — RAPID URINE DRUG SCREEN, HOSP PERFORMED
Amphetamines: NOT DETECTED
Barbiturates: NOT DETECTED
Benzodiazepines: POSITIVE — AB
Cocaine: NOT DETECTED
Opiates: NOT DETECTED
Tetrahydrocannabinol: NOT DETECTED

## 2020-09-14 MED ORDER — NICOTINE 21 MG/24HR TD PT24
21.0000 mg | MEDICATED_PATCH | Freq: Once | TRANSDERMAL | Status: DC
  Administered 2020-09-14: 21 mg via TRANSDERMAL
  Filled 2020-09-14: qty 1

## 2020-09-14 NOTE — ED Provider Notes (Signed)
[facility] EMERGENCY DEPARTMENT Provider Note   CSN: [id number] Arrival date & time: 09/14/20  0865     History Chief Complaint  Patient presents with  . Suicidal    Richard Osborn is a 45 y.o. male.  The history is provided by the patient and the police. No language interpreter was used.  Depression This is a recurrent problem. The problem occurs constantly. The problem has been rapidly worsening. Nothing aggravates the symptoms. Nothing relieves the symptoms. He has tried nothing for the symptoms.  Pt reports he has been drinking and depressed.  Pt called his employer and told them that he was going to commit suicide.  Employer called police.  Pt had a loaded gun.  He gave gun to police and asked for help.  Pt brought here by police.  He is voluntary      Past Medical History:  Diagnosis Date  . Depressed   . Sleep apnea     Patient Active Problem List   Diagnosis Date Noted  . Bilateral pulmonary embolism (HCC) 11/19/2018  . ARF (acute renal failure) (HCC) 11/19/2018  . Alcohol abuse with alcohol-induced mood disorder (HCC) 11/22/2016  . Chronic alcoholism (HCC)   . Hypotension 07/06/2015  . 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 Morbid obesity due to excess calories (HCC) 06/26/2015  . Sepsis (HCC) 06/26/2015  . Cellulitis 06/24/2015  . Cellulitis of right leg 06/24/2015  . Acute kidney injury (HCC) 06/24/2015  . Leucocytosis 06/24/2015  . OSA (obstructive sleep apnea) 09/23/2014    Past Surgical History:  Procedure Laterality Date  . arm surgery     . NASAL SEPTUM SURGERY    . ORIF ULNAR / RADIAL SHAFT FRACTURE         Family History  Problem Relation Age of Onset  . Diabetes Other        grandmother    Social History   Tobacco Use  . Smoking status: Former Smoker    Packs/day: 1.00    Years: 20.00    Pack years: 20.00    Types: Cigarettes  . Smokeless tobacco: Never Used  Vaping Use  . Vaping Use: Never  used  Substance Use Topics  . Alcohol use: Yes    Alcohol/week: 0.0 standard drinks    Comment: daily   . Drug use: No    Home Medications Prior to Admission medications   Medication Sig Start Date End Date Taking? Authorizing Provider  apixaban (ELIQUIS) 5 MG TABS tablet Take 5 mg by mouth 2 (two) times daily.  12/13/18   [provider]  testosterone cypionate (DEPOTESTOSTERONE CYPIONATE) 200 MG/ML injection Inject 1 mL into the muscle See admin instructions. Every 10 days 07/18/18   [provider]  varenicline (CHANTIX) 1 MG tablet Take 1 mg by mouth 2 (two) times daily.    [provider]    Allergies    Patient has no known allergies.  Review of Systems   Review of Systems  Psychiatric/Behavioral: Positive for depression and suicidal ideas.  All other systems reviewed and are negative.   Physical Exam Updated Vital Signs BP 96/64   Pulse 72   Temp 97.7 F (36.5 C) (Oral)   Resp 18   Ht 5\' 9"  (1.753 m)   Wt 133.8 kg   SpO2 96%   BMI 43.56 kg/m   Physical Exam Vitals and nursing note reviewed.  Constitutional:      Appearance: He is well-developed.  HENT:  Head: Normocephalic and atraumatic.  Eyes:     Conjunctiva/sclera: Conjunctivae normal.  Cardiovascular:     Rate and Rhythm: Normal rate and regular rhythm.     Heart sounds: No murmur heard.   Pulmonary:     Effort: Pulmonary effort is normal. No respiratory distress.     Breath sounds: Normal breath sounds.  Abdominal:     Palpations: Abdomen is soft.     Tenderness: There is no abdominal tenderness.  Musculoskeletal:        General: Normal range of motion.     Cervical back: Neck supple.  Skin:    General: Skin is warm and dry.  Neurological:     Mental Status: He is alert.  Psychiatric:        Mood and Affect: Mood normal.     ED Results / Procedures / Treatments   Labs (all labs ordered are listed, but only abnormal results are displayed) Labs Reviewed   COMPREHENSIVE METABOLIC PANEL - Abnormal; Notable for the following components:      Result Value   Sodium 146 (*)    Calcium 8.2 (*)    Albumin 3.3 (*)    AST 58 (*)    ALT 110 (*)    All other components within normal limits  ETHANOL - Abnormal; Notable for the following components:   Alcohol, Ethyl (B) 350 (*)    All other components within normal limits  RAPID URINE DRUG SCREEN, HOSP PERFORMED - Abnormal; Notable for the following components:   Benzodiazepines POSITIVE (*)    All other components within normal limits  CBC WITH DIFFERENTIAL/PLATELET - Abnormal; Notable for the following components:   MCV 108.2 (*)    MCH 36.9 (*)    All other components within normal limits  RESP PANEL BY RT-PCR (FLU A&B, COVID) ARPGX2    EKG None  Radiology No results found.  Procedures Procedures   Medications Ordered in ED Medications - No data to display  ED Course  I have reviewed the triage vital signs and the nursing notes.  Pertinent labs & imaging results that were available during my care of the patient were reviewed by me and considered in my medical decision making (see chart for details).    MDM Rules/Calculators/A&P                          MDM:  Medical clearance labs ordered.  TTS ordered.   Final Clinical Impression(s) / ED Diagnoses Final diagnoses:  Depression, unspecified depression type  Alcohol abuse  Suicidal behavior without attempted self-injury    Rx / DC Orders ED Discharge Orders    None       Osie Cheeks 09/15/20 4098    Alvira Monday, MD 09/15/20 [phone]

## 2020-09-14 NOTE — ED Notes (Signed)
Pt speaking on tts machine

## 2020-09-14 NOTE — BH Assessment (Signed)
[name], NP, patient meets inpatient criteria. Trula Ore, Oncologist, Charity fundraiser, informed via Fish farm manager. [place], AC, pending review for bed placement at [facility].

## 2020-09-14 NOTE — ED Notes (Signed)
All access wires and cords removed from pt room

## 2020-09-14 NOTE — ED Triage Notes (Signed)
Pt was at home called employer wanting to kill himself.  Police was called pt had a gun in the hand turned over to the police and stated he was intoxicated drinking a 1/5 of vodka and wanted to get help. So pt. Is here for voluntary committment. Police state that if it changes for voluntary police can take over an IVC

## 2020-09-14 NOTE — ED Notes (Signed)
ETOH 350-Critical result reported to Primary RN and Dr. Dalene Seltzer.

## 2020-09-15 ENCOUNTER — Encounter (HOSPITAL_COMMUNITY): Payer: Self-pay | Admitting: Nurse Practitioner

## 2020-09-15 ENCOUNTER — Other Ambulatory Visit: Payer: Self-pay

## 2020-09-15 ENCOUNTER — Inpatient Hospital Stay (HOSPITAL_COMMUNITY)
Admission: AD | Admit: 2020-09-15 | Discharge: 2020-09-18 | DRG: 897 | Disposition: A | Discharge status: Home / Self Care | Payer: BC Managed Care – PPO | Attending: Psychiatry | Admitting: Psychiatry

## 2020-09-15 DIAGNOSIS — Z79899 Other long term (current) drug therapy: Secondary | ICD-10-CM | POA: Diagnosis not present

## 2020-09-15 DIAGNOSIS — G4733 Obstructive sleep apnea (adult) (pediatric): Secondary | ICD-10-CM | POA: Diagnosis present

## 2020-09-15 DIAGNOSIS — F32A Depression, unspecified: Secondary | ICD-10-CM | POA: Diagnosis present

## 2020-09-15 DIAGNOSIS — F1024 Alcohol dependence with alcohol-induced mood disorder: Secondary | ICD-10-CM | POA: Diagnosis present

## 2020-09-15 DIAGNOSIS — Z7901 Long term (current) use of anticoagulants: Secondary | ICD-10-CM | POA: Diagnosis not present

## 2020-09-15 DIAGNOSIS — F1014 Alcohol abuse with alcohol-induced mood disorder: Secondary | ICD-10-CM | POA: Diagnosis present

## 2020-09-15 DIAGNOSIS — R45851 Suicidal ideations: Secondary | ICD-10-CM | POA: Diagnosis present

## 2020-09-15 DIAGNOSIS — Z20822 Contact with and (suspected) exposure to covid-19: Secondary | ICD-10-CM | POA: Diagnosis present

## 2020-09-15 DIAGNOSIS — Y908 Blood alcohol level of 240 mg/100 ml or more: Secondary | ICD-10-CM | POA: Diagnosis present

## 2020-09-15 DIAGNOSIS — Z87891 Personal history of nicotine dependence: Secondary | ICD-10-CM

## 2020-09-15 DIAGNOSIS — F1994 Other psychoactive substance use, unspecified with psychoactive substance-induced mood disorder: Secondary | ICD-10-CM | POA: Diagnosis present

## 2020-09-15 DIAGNOSIS — N182 Chronic kidney disease, stage 2 (mild): Secondary | ICD-10-CM | POA: Diagnosis present

## 2020-09-15 MED ORDER — MAGNESIUM HYDROXIDE 400 MG/5ML PO SUSP: 30.0000 mL | Freq: Every day | ORAL | Status: DC | PRN

## 2020-09-15 MED ORDER — NICOTINE POLACRILEX 2 MG MT GUM
2.0000 mg | CHEWING_GUM | OROMUCOSAL | Status: DC | PRN
  Administered 2020-09-16 – 2020-09-18 (×4): 2 mg via ORAL
  Filled 2020-09-15 (×2): qty 1

## 2020-09-15 MED ORDER — ACETAMINOPHEN 325 MG PO TABS
650.0000 mg | ORAL_TABLET | Freq: Four times a day (QID) | ORAL | Status: DC | PRN
  Administered 2020-09-17: 650 mg via ORAL
  Filled 2020-09-15: qty 2

## 2020-09-15 MED ORDER — ALUM & MAG HYDROXIDE-SIMETH 200-200-20 MG/5ML PO SUSP: 30.0000 mL | ORAL | Status: DC | PRN

## 2020-09-15 MED ORDER — LOPERAMIDE HCL 2 MG PO CAPS: 2.0000 mg | ORAL_CAPSULE | ORAL | Status: AC | PRN

## 2020-09-15 MED ORDER — THIAMINE HCL 100 MG PO TABS
100.0000 mg | ORAL_TABLET | Freq: Every day | ORAL | Status: DC
  Administered 2020-09-16 – 2020-09-18 (×3): 100 mg via ORAL
  Filled 2020-09-15 (×5): qty 1

## 2020-09-15 MED ORDER — HYDROXYZINE HCL 25 MG PO TABS
25.0000 mg | ORAL_TABLET | Freq: Four times a day (QID) | ORAL | Status: AC | PRN
  Administered 2020-09-15 – 2020-09-17 (×3): 25 mg via ORAL
  Filled 2020-09-15 (×3): qty 1

## 2020-09-15 MED ORDER — TRAZODONE HCL 50 MG PO TABS
50.0000 mg | ORAL_TABLET | Freq: Every evening | ORAL | Status: DC | PRN
  Administered 2020-09-15 – 2020-09-17 (×3): 50 mg via ORAL
  Filled 2020-09-15 (×3): qty 1

## 2020-09-15 MED ORDER — ONDANSETRON 4 MG PO TBDP: 4.0000 mg | ORAL_TABLET | Freq: Four times a day (QID) | ORAL | Status: AC | PRN

## 2020-09-15 MED ORDER — LORAZEPAM 1 MG PO TABS: 1.0000 mg | ORAL_TABLET | Freq: Four times a day (QID) | ORAL | Status: AC | PRN

## 2020-09-15 NOTE — BH Assessment (Signed)
[name], NP, patient meets inpatient criteria.  Hulan Fess, patient accepted to [facility]  Adult Unit 401-2 Arrival time at 12noon Attending is Dr. Jola Babinski.  Report [phone] [name] RN and [name], RN, informed of acceptance via secure chat.

## 2020-09-15 NOTE — Tx Team (Signed)
Initial Treatment Plan 09/15/2020 2:02 PM Richard Osborn AOZ:[id number]    PATIENT STRESSORS: Health problems Substance abuse Other:  Pt concerned that he will miss too much work   PATIENT STRENGTHS: Ability for insight Active sense of humor Average or above average intelligence Capable of independent living Metallurgist fund of knowledge Motivation for treatment/growth Supportive family/friends Work skills   PATIENT IDENTIFIED PROBLEMS: ETOH abuse  depression                   DISCHARGE CRITERIA:  Ability to meet basic life and health needs Improved stabilization in mood, thinking, and/or behavior Medical problems require only outpatient monitoring Motivation to continue treatment in a less acute level of care Reduction of life-threatening or endangering symptoms to within safe limits Verbal commitment to aftercare and medication compliance Withdrawal symptoms are absent or subacute and managed without 24-hour nursing intervention  PRELIMINARY DISCHARGE PLAN: Attend aftercare/continuing care group Attend 12-step recovery group Return to previous living arrangement Return to previous work or school arrangements   PATIENT/FAMILY INVOLVEMENT: This treatment plan has been presented to and reviewed with the patient, Richard Osborn.  The patient has been given the opportunity to ask questions and make suggestions.  Wardell Heath, RN 09/15/2020, 2:02 PM

## 2020-09-15 NOTE — ED Provider Notes (Signed)
Emergency Medicine Observation Re-evaluation Note  Richard Osborn is a 45 y.o. male, seen on rounds today.  Pt initially presented to the ED for complaints of Suicidal Currently, the patient is resting in bed.  Physical Exam  BP 132/82 (BP Location: Right Arm)   Pulse 83   Temp 98.6 F (37 C) (Oral)   Resp 18   Ht 5\' 9"  (1.753 m)   Wt 133.8 kg   SpO2 99%   BMI 43.56 kg/m  Physical Exam General: Resting Cardiac: Warm and well-perfused Lungs: Even and unlabored Psych: Calm  ED Course / MDM  EKG:EKG Interpretation  Date/Time:  Monday September 14 2020 10:05:25 EDT Ventricular Rate:  70 PR Interval:  200 QRS Duration: 106 QT Interval:  426 QTC Calculation: 460 R Axis:   80 Text Interpretation: Sinus rhythm Borderline low voltage, extremity leads Baseline wander in lead(s) V1 Confirmed by 08-28-2002 [phone]) on 09/14/2020 2:32:41 PM   I have reviewed the labs performed to date as well as medications administered while in observation.  Recent changes in the last 24 hours include psych recommending inpatient, admitted to [facility].  Plan  Current plan is for admit to [facility]. Patient is not under full IVC at this time.   [facility], MD 09/15/20 [phone]

## 2020-09-15 NOTE — ED Notes (Signed)
Report given to [facility] RN 

## 2020-09-15 NOTE — BH Assessment (Signed)
Comprehensive Clinical Assessment (CCA) Note  09/15/2020 Richard Osborn [id number]   Disposition [name], NP, patient meets inpatient criteria.  Hulan Fess, patient accepted to [facility]  Adult Unit 401-2 Arrival time at 12noon Attending is Dr. Jola Babinski.  Report [phone] Richard Ina RN and [name], RN, informed of acceptance via secure chat.   The patient demonstrates the following risk factors for suicide: Chronic risk factors for suicide include: substance use disorder. Acute risk factors for suicide include: family or marital conflict. Protective factors for this patient include: positive social support, positive therapeutic relationship, responsibility to others (children, family), coping skills, hope for the future and life satisfaction. Considering these factors, the overall suicide risk at this point appears to be high. Patient is not appropriate for outpatient follow up.   Flowsheet Row ED from 09/14/2020 in [facility] EMERGENCY DEPARTMENT ED from 06/22/2020 in [facility] EMERGENCY DEPARTMENT  C-SSRS RISK CATEGORY Error: Question 6 not populated No Risk    1:1 recommended  Richard Osborn is a 45 year old male presenting voluntarily to [facility] due to SI and intoxication. Patient stated, "I called out of work and unsure what I said, my boss thought I sounded suicidal so the police came out to the house". Patient vaguely remembered stating "I was having a bad day, I messed up for drinking, I don't want to be here". Patient reported the police took his gun when they came out to the home. Patient was holding gun in hand when police arrived to his home. Patient reported to police that he wanted help. Patient denied prior psych hospitalization, suicide attempts and self-harming behaviors. Patient denied current SI. Patient denied HI and psychosis. Patient is currently being seen at [facility] for alcohol detox treatment. Patient reported drinking 1 quart of  beer last night.   Patient resides with girlfriend. Patient has a 59 year old child lives with child's mother. Patient reported discord with girlfriend. Patient is currently employed and denied work-related stressors, stating "I love my job". Patient reported no other guns in the home after police took his gun.   PER TRIAGE NOTE Pt was at home called employer wanting to kill himself.  Police was called pt had a gun in the hand turned over to the police and stated he was intoxicated drinking a 1/5 of vodka and wanted to get help. So pt. Is here for voluntary committment. Police state that if it changes for voluntary police can take over an IVC.  Chief Complaint:  Chief Complaint  Patient presents with  . Suicidal   Visit Diagnosis:  Major depressive disorder Alcohol dependence  CCA Biopsychosocial Intake/Chief Complaint:  SI and intoxication.  Current Symptoms/Problems: SI and intoxication.  Patient Reported Schizophrenia/Schizoaffective Diagnosis in Past: No data recorded  Strengths: self-awareness  Preferences: alcohol treatment  Abilities: No data recorded  Type of Services Patient Feels are Needed: outpatient services  Initial Clinical Notes/Concerns: No data recorded  Mental Health Symptoms Depression:  None   Duration of Depressive symptoms: No data recorded  Mania:  None   Anxiety:   Sleep   Psychosis:  None   Duration of Psychotic symptoms: No data recorded  Trauma:  None   Obsessions:  None   Compulsions:  None   Inattention:  None   Hyperactivity/Impulsivity:  No data recorded  Oppositional/Defiant Behaviors:  None   Emotional Irregularity:  None   Other Mood/Personality Symptoms:  No data recorded   Mental Status Exam Appearance and self-care  Stature:  Average   Weight:  Obese   Clothing:  Neat/clean   Grooming:  Normal   Cosmetic use:  None   Posture/gait:  Normal   Motor activity:  Not Remarkable   Sensorium  Attention:   Normal   Concentration:  Normal   Orientation:  X5   Recall/memory:  Normal   Affect and Mood  Affect:  Anxious; Appropriate   Mood:  Anxious   Relating  Eye contact:  Normal   Facial expression:  Anxious; Sad   Attitude toward examiner:  Cooperative   Thought and Language  Speech flow: Normal   Thought content:  Appropriate to Mood and Circumstances   Preoccupation:  None   Hallucinations:  None   Organization:  No data recorded  [employer] of Knowledge:  Average   Intelligence:  Average   Abstraction:  Normal   Judgement:  Dangerous   Reality Testing:  Realistic   Insight:  Fair   Decision Making:  Impulsive   Social Functioning  Social Maturity:  No data recorded  Social Judgement:  No data recorded  Stress  Stressors:  Other (Comment) (alcohol usage)   Coping Ability:  Overwhelmed   Skill Deficits:  Self-control   Supports:  Friends/Service system    Religion:   Leisure/Recreation: Leisure / Recreation Do You Have Hobbies?: Yes Leisure and Hobbies: working out at [employer], hockey and video games.  Exercise/Diet: Exercise/Diet Do You Exercise?: Yes What Type of Exercise Do You Do?: Weight Training How Many Times a Week Do You Exercise?: 1-3 times a week Do You Follow a Special Diet?: No Do You Have Any Trouble Sleeping?: Yes Explanation of Sleeping Difficulties: only 6 hours, need more  CCA Employment/Education Employment/Work Situation: Employment / Work Environmental consultant job has been impacted by current illness: No Has patient ever been in the [employer]?: No  Education: Education Is Patient Currently Attending School?: No Last Grade Completed: 12 Did Garment/textile technologist From [employer]?: Yes Did You Have An Individualized Education Program (IIEP): No Did You Have Any Difficulty At [employer]?: No Patient's Education Has Been Impacted by Current Illness: No  CCA Family/Childhood History Family and Relationship  History: Family history Does patient have children?: Yes How many children?: 1 How is patient's relationship with their children?: good  Childhood History:  Childhood History Description of patient's relationship with caregiver when they were a child: uta Patient's description of current relationship with people who raised him/her: uta How were you disciplined when you got in trouble as a child/adolescent?: uta Does patient have siblings?: Yes Number of Siblings:  (step-siblings) Did patient suffer any verbal/emotional/physical/sexual abuse as a child?: No Did patient suffer from severe childhood neglect?: No Has patient ever been sexually abused/assaulted/raped as an adolescent or adult?: No Was the patient ever a victim of a crime or a disaster?: No Witnessed domestic violence?: No Has patient been affected by domestic violence as an adult?: No  Child/Adolescent Assessment:   CCA Substance Use Alcohol/Drug Use: Alcohol / Drug Use Pain Medications: see MAR Prescriptions: see MAR Over the Counter: see MAR History of alcohol / drug use?: Yes Substance #1 Name of Substance 1: alcohol 1 - Age of First Use: uta 1 - Amount (size/oz): 1 quart 1 - Frequency: daily prior to treatment at [facility] 1 - Last Use / Amount: last night 1 - Method of Aquiring: paycheck 1- Route of Use: oral   ASAM's:  Six Dimensions of Multidimensional Assessment  Dimension 1:  Acute Intoxication and/or Withdrawal Potential:      Dimension 2:  Biomedical Conditions and Complications:      Dimension 3:  Emotional, Behavioral, or Cognitive Conditions and Complications:     Dimension 4:  Readiness to Change:     Dimension 5:  Relapse, Continued use, or Continued Problem Potential:     Dimension 6:  Recovery/Living Environment:     ASAM Severity Score:    ASAM Recommended Level of Treatment:     Substance use Disorder (SUD)   Recommendations for Services/Supports/Treatments: Recommendations for  Services/Supports/Treatments Recommendations For Services/Supports/Treatments: CD-IOP Intensive Chemical Dependency Program  DSM5 Diagnoses: Patient Active Problem List   Diagnosis Date Noted  . Bilateral pulmonary embolism (HCC) 11/19/2018  . ARF (acute renal failure) (HCC) 11/19/2018  . Alcohol abuse with alcohol-induced mood disorder (HCC) 11/22/2016  . Chronic alcoholism (HCC)   . Hypotension 07/06/2015  . 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 Morbid obesity due to excess calories (HCC) 06/26/2015  . Sepsis (HCC) 06/26/2015  . Cellulitis 06/24/2015  . Cellulitis of right leg 06/24/2015  . Acute kidney injury (HCC) 06/24/2015  . Leucocytosis 06/24/2015  . OSA (obstructive sleep apnea) 09/23/2014   Patient Centered Plan: Patient is on the following Treatment Plan(s):    Referrals to Alternative Service(s): Referred to Alternative Service(s):   Place:   Date:   Time:    Referred to Alternative Service(s):   Place:   Date:   Time:    Referred to Alternative Service(s):   Place:   Date:   Time:    Referred to Alternative Service(s):   Place:   Date:   Time:     Burnetta Sabin, [facility]

## 2020-09-15 NOTE — Progress Notes (Signed)
   09/15/20 2200  Psych Admission Type (Psych Patients Only)  Admission Status Involuntary  Psychosocial Assessment  Patient Complaints Anxiety  Eye Contact Brief  Facial Expression Flat  Affect Angry;Anxious;Depressed  Speech Logical/coherent  Interaction Assertive  Motor Activity Hand-wringing  Appearance/Hygiene Unremarkable;In scrubs  Behavior Characteristics Anxious  Mood Depressed  Thought Process  Coherency WDL  Content Blaming self  Delusions None reported or observed  Perception WDL  Hallucination None reported or observed  Judgment Poor  Danger to Self  Current suicidal ideation? Denies  Danger to Others  Danger to Others None reported or observed

## 2020-09-15 NOTE — Progress Notes (Signed)
   09/15/20 1300  Vital Signs  Temp 98.7 F (37.1 C)  Temp Source Oral  Pulse Rate 72  Pulse Rate Source Dinamap  Resp 18  BP 140/84  BP Location Left Arm  BP Method Automatic  Patient Position (if appropriate) Sitting  Oxygen Therapy  SpO2 100 %  O2 Device Room Air  Pain Assessment  Pain Scale 0-10  Pain Score 0  Height and Weight  Height 5\' 9"  (1.753 m)  Weight 135.2 kg  Type of Scale Used Standing  Type of Weight Actual  BSA (Calculated - sq m) 2.57 sq meters  BMI (Calculated) 43.99  Weight in (lb) to have BMI = 25 168.9  CIWA-Ar  Nausea and Vomiting 0  Tactile Disturbances 0  Tremor 0  Auditory Disturbances 0  Paroxysmal Sweats 0  Visual Disturbances 0  Anxiety 1  Headache, Fullness in Head 0  Agitation 0  Orientation and Clouding of Sensorium 0  CIWA-Ar Total 1   Initial Nursing Assessment D: Pt. Is a 45 y.o. Caucasian male [facility] from [facility] for a SI. Pt. Had called into work because he had been drinking and was so "mad" at himself for "falling off the wagon" that he made some vague suicidal statements to his boss. His boss called the police. Police confiscated his gun from his home. Pt. has a hx of ETOH substance abuse and MDD. Pt. completed 28 days at [facility] in [place] in 2018, but recently had started drinking after work basically so "not to feel bad" to get rid of the withdrawal symptoms. Pt. Was on Librium for tid but "did not finish the course." Currently pt. Denies SI/HI/AVH. Pt. Was A&Ox4, calm and cooperative during interview. Pt. Denies having any withdrawal symptoms presently.  A: Support and encouragement provided Routine safety checks conducted every 15 minutes. Patient  Informed to notify staff with any concerns.   R: Pt. Verbally contracts for safety. Safety maintained.

## 2020-09-15 NOTE — Progress Notes (Signed)
Recreation Therapy Notes  Animal-Assisted Activity (AAA) Program Checklist/Progress Notes Patient Eligibility Criteria Checklist & Daily Group note for Rec Tx Intervention  Date: 4.19.22 Time: 1430 Location: [address]   AAA/T Program Assumption of Risk Form signed by Engineer, production or Parent Legal Guardian YES   Patient is free of allergies or severe asthma YES  Patient reports no fear of animals  YES  Patient reports no history of cruelty to animals YES   Patient understands his/her participation is voluntary YES  Patient washes hands before animal contact YES   Patient washes hands after animal contact  YES      Education: Charity fundraiser, Appropriate Animal Interaction   Education Outcome: Acknowledges understanding/In group clarification offered/Needs additional education.   Clinical Observations/Feedback: Pt did not attend group session.   Richard Osborn, LRT/CTRS    Richard Osborn A 09/15/2020 3:33 PM

## 2020-09-15 NOTE — ED Notes (Signed)
Pt transported to [facility] with safe transport. Safe transport given pt belongings. Pt calm ans cooperative.

## 2020-09-16 DIAGNOSIS — F1014 Alcohol abuse with alcohol-induced mood disorder: Secondary | ICD-10-CM

## 2020-09-16 DIAGNOSIS — F1994 Other psychoactive substance use, unspecified with psychoactive substance-induced mood disorder: Secondary | ICD-10-CM | POA: Diagnosis not present

## 2020-09-16 LAB — T4, FREE: Free T4: 0.75 ng/dL (ref 0.61–1.12)

## 2020-09-16 LAB — HEMOGLOBIN A1C
Hgb A1c MFr Bld: 4.9 % (ref 4.8–5.6)
Mean Plasma Glucose: 93.93 mg/dL

## 2020-09-16 LAB — LIPID PANEL
Cholesterol: 215 mg/dL — ABNORMAL HIGH (ref 0–200)
HDL: 90 mg/dL (ref >40)
LDL Cholesterol: 108 mg/dL — ABNORMAL HIGH (ref 0–99)
Total CHOL/HDL Ratio: 2.4 RATIO
Triglycerides: 85 mg/dL (ref <150)
VLDL: 17 mg/dL (ref 0–40)

## 2020-09-16 LAB — TSH: TSH: 5.811 u[IU]/mL — ABNORMAL HIGH (ref 0.350–4.500)

## 2020-09-16 MED ORDER — PANTOPRAZOLE SODIUM 40 MG PO TBEC: 40.0000 mg | DELAYED_RELEASE_TABLET | Freq: Every day | ORAL | Status: DC | PRN

## 2020-09-16 NOTE — Progress Notes (Signed)
D:  Patient denied SI and HI, contracts for safety.  Denied A/V hallucinations.   A:  Medications administered per MD orders.  Emotional support and encouragement given patient. R:  Safety maintained with 15 minute checks.  

## 2020-09-16 NOTE — BHH Group Notes (Signed)
Type of Therapy and Topic:  Group Therapy - Healthy vs Unhealthy Coping Skills  Participation Level:  Active   Description of Group The focus of this group was to determine what unhealthy coping techniques typically are used by group members and what healthy coping techniques would be helpful in coping with various problems. Patients were guided in becoming aware of the differences between healthy and unhealthy coping techniques. Patients were asked to identify 2-3 healthy coping skills they would like to learn to use more effectively.  Therapeutic Goals 1. Patients learned that coping is what human beings do all day long to deal with various situations in their lives 2. Patients defined and discussed healthy vs unhealthy coping techniques 3. Patients identified their preferred coping techniques and identified whether these were healthy or unhealthy 4. Patients determined 2-3 healthy coping skills they would like to become more familiar with and use more often. 5. Patients provided support and ideas to each other   Summary of Patient Progress:  During group Richard Osborn spent time engaging with his peers and discussing various coping skills that he could use outside of the hospital.  One coping skills that was discussed amongst the group was spending time with friends and loved ones.  Another major theme of the group was various physical activities such as playing basketball and picking flowers. Richard Osborn was appropriate and participated in all group activities.    Therapeutic Modalities Cognitive Behavioral Therapy Motivational Interviewing

## 2020-09-16 NOTE — BHH Counselor (Signed)
Adult Comprehensive Assessment  Patient ID: Richard Osborn, male   DOB: 25-Jan-1976, 45 y.o.   MRN: [id number]  Information Source: Information source: Patient  Current Stressors:  Patient states their primary concerns and needs for treatment are:: "I relapsed on alcohol" Patient states their goals for this hospitilization and ongoing recovery are:: "To get back to IOP treatment" Educational / Learning stressors: Pt reports having a G.E.D. Employment / Job issues: Pt reports working at [employer] Relationships: Pt reports conflict with father and did not develop a relationship with him until 15 years ago Surveyor, quantity / Lack of resources (include bankruptcy): Pt reports no stressors Housing / Lack of housing: Pt reports living with his girlfriend and the girlfriend's brother Physical health (include injuries & life threatening diseases): Pt reports no stressors Social relationships: Pt reports no stressors Substance abuse: Pt reports drinking alcohol daily until 1 week prior to admissions Bereavement / Loss: Pt reports grandmother passed away 8 years ago  Living/Environment/Situation:  Living Arrangements: Spouse/significant other,Non-relatives/Friends Living conditions (as described by patient or guardian): "I like it there but its an old home that needs repairs" Who else lives in the home?: Girlfriend and girlfriend's brother How long has patient lived in current situation?: 2 years What is atmosphere in current home: Supportive,Comfortable  Family History:  Marital status: Long term relationship Long term relationship, how long?: 2 and a half years What types of issues is patient dealing with in the relationship?: None Are you sexually active?: Yes What is your sexual orientation?: Heterosexual Has your sexual activity been affected by drugs, alcohol, medication, or emotional stress?: No Does patient have children?: Yes How many children?: 1 How is patient's relationship with their  children?: "I have a 46 year old son but I dont get to see him often because of my drinking"  Childhood History:  By whom was/is the patient raised?: Grandparents Additional childhood history information: Pt reports he did not develop a relationship with his father until 15 years ago due to father not being around often.  Pt reports Grandmother was given custody of the Pt at 40 months old Description of patient's relationship with caregiver when they were a child: "I had a good relationship with my grandmother" Patient's description of current relationship with people who raised him/her: "My grandmother passed away 8 years ago" How were you disciplined when you got in trouble as a child/adolescent?: Spankings Does patient have siblings?: Yes Number of Siblings: 5 Description of patient's current relationship with siblings: "I have 2 half sisters and 3 half brothers and I only talk to my youngest sister" Did patient suffer any verbal/emotional/physical/sexual abuse as a child?: No Did patient suffer from severe childhood neglect?: No Has patient ever been sexually abused/assaulted/raped as an adolescent or adult?: No Was the patient ever a victim of a crime or a disaster?: No Witnessed domestic violence?: No Has patient been affected by domestic violence as an adult?: No  Education:  Highest grade of school patient has completed: Pt reports having a G.E.D. Currently a student?: No Learning disability?: No  Employment/Work Situation:   Employment situation: Employed Where is patient currently employed?: [employer] long has patient been employed?: 3 years Patient's job has been impacted by current illness: No What is the longest time patient has a held a job?: 9 years Where was the patient employed at that time?: [employer] Has patient ever been in the [employer]?: No  Financial Resources:   Financial resources: Income from [employer]  insurance Does patient  have a representative payee or guardian?: No  Alcohol/Substance Abuse:   What has been your use of drugs/alcohol within the last 12 months?: Pt reports drinking alcohol daily until one week prior to admission If attempted suicide, did drugs/alcohol play a role in this?: No Alcohol/Substance Abuse Treatment Hx: Past Tx, Inpatient,Attends AA/NA If yes, describe treatment: Pt reports [facility] in 2018 and AA Meetings on and off since 2018 Has alcohol/substance abuse ever caused legal problems?: No  Social Support System:   Conservation officer, nature Support System: Production assistant, radio System: Girlfriend, Mother, Friends Type of faith/religion: None How does patient's faith help to cope with current illness?: None  Leisure/Recreation:   Do You Have Hobbies?: Yes Leisure and Hobbies: Going to the gym and playing Hockey  Strengths/Needs:   What is the patient's perception of their strengths?: Working and being there for others Patient states they can use these personal strengths during their treatment to contribute to their recovery: "Helps me to focus on something different" Patient states these barriers may affect/interfere with their treatment: None Patient states these barriers may affect their return to the community: None Other important information patient would like considered in planning for their treatment: None  Discharge Plan:   Currently receiving community mental health services: Yes (From Whom) ([facility]) Patient states concerns and preferences for aftercare planning are: Pt would like to remain with the [facility] Patient states they will know when they are safe and ready for discharge when: "I feel like I could go now" Does patient have access to transportation?: Yes (Pt reports having his own car at home) Does patient have financial barriers related to discharge medications?: No Will patient be returning to same living situation after discharge?:  Yes  Summary/Recommendations:   Summary and Recommendations (to be completed by the evaluator): Richard Osborn is a 45 year old, Caucasian, male who was admitted to the hospital due to suicidal thoughts, worsening depression, and a relapse on alcohol.  The Pt reports living with his girlfriend and the girlfriend's brother.  He reports that the home is older and in need of repairs but otherwise there are no social or relationship issues within the home.  The Pt reports conflict with his father as a child and did not develop a relationship with his father until 15 years ago.  The Pt also has a 95 year old son that he does not get to see due to his alcohol use and admits to a strained relationship with 4 out of his 5 half siblings.  He states that he only speaks with his mother and youngest half sister.  The Pt reports that he has his own transportation and works for [employer] in [place].  The Pt reports drinking alcohol daily until one week prior to his admission to the hospital.  The Pt attending [facility] and AA Meetings in 2018 for his alcohol use.  The Pt currently attends the [facility] for IOP substance use treatment.  While in the hospital the Pt can benefit from crisis stabilization, medication evaluation, group therapy, psycho-education, case management, and discharge planning.  Upon discharge the Pt will return home with his girlfriend and will follow up with the [facility] for outpatient therapy and medication management, as well as IOP services.  Aram Beecham. 09/16/2020

## 2020-09-16 NOTE — H&P (Signed)
Psychiatric Admission Assessment Adult  Patient Identification: Richard Osborn MRN:  [id number] Date of Evaluation:  09/16/2020 Chief Complaint:  Substance induced mood disorder (HCC) [F19.94] Principal Diagnosis: Alcohol abuse with alcohol-induced mood disorder (HCC) Diagnosis:  Principal Problem:   Alcohol abuse with alcohol-induced mood disorder (HCC) Active Problems:   Substance induced mood disorder (HCC)  History of Present Illness: (From TTS assessment note): Richard Osborn is a 45 year old male presenting voluntarily to [facility] due to SI and intoxication. Patient stated, "I called out of work and unsure what I said, my boss thought I sounded suicidal so the police came out to the house". Patient vaguely remembered stating "I was having a bad day, I messed up for drinking, I don't want to be here". Patient reported the police took his gun when they came out to the home. Patient was holding gun in hand when police arrived to his home. Patient reported to police that he wanted help. Patient denied prior psych hospitalization, suicide attempts and self-harming behaviors. Patient denied current SI. Patient denied HI and psychosis. Patient is currently being seen at [facility] for alcohol detox treatment. Patient reported drinking 1 quart of beer last night.   Patient resides with girlfriend. Patient has a 75 year old child lives with child's mother. Patient reported discord with girlfriend. Patient is currently employed and denied work-related stressors, stating "I love my job". Patient reported no other guns in the home after police took his gun.   Evaluation on the unit: Patient is a 45 year old male who presented to the [facility], vis [facility], after he called his employer while intoxicated and made statements that had the employer worried he was going to harm himself. This represents his first inpatient psychiatric admission. He was at [facility] in 2018 and most recently has been attending SAIOP  at [facility]. He plans to go back to [facility] to continue his treatment.    Patient was seen and evaluated on the unit today. He is pleasant on approach, calm and cooperative. He stated he has had a battle with alcohol since 1996 when he got his first DUI, his second DUI was in 1997,  He lost his license for a time and has had no additional DUI's.  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 He stated "I know better but I did it anyway." He stated he drank half of it that night and the rest Tuesday morning after he got up. He called his employer and said "some stupid things." She called the cops and he was taken tot he hospital, then admitted to [facility]. Patient stated he had a gun when the cops showed up but had no intention of doing anything with it. The cops took the gun from him. He lives with his girlfriend and her brother but was home alone at the time. He denies SI/HI/AVH, paranoia and delusions. He denies depressive symptoms. He stated he feels guilty over relapsing. He denies symptoms of mania, denies physical, emotional and sexual trauma or abuse. He stated he has never been treated for depression, anxiety, or any mental health problem.   He has had previous alcohol treatment at [facility] in 2018. He does not have a sponsor currently but does go to [employer] He is in SAIOP at the [facility] since 2 weeks ago. He is divorced. He has a  32 year old son, living in [place], but is not currently seeing him because he is trying to get his alcoholism under control. He stated "I don't want him to see me like this, I want to be better for him." Patient is forward thinking and goal oriented. 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 He stated " I know I have to make this work, I can not lose my license, it is my livelihood." Patient is motivated, goal oriented and forward thinking.    He denies  allergies to medications. He denies physical health problems with the exception of sleep apnea, he has a C-PAP machine at home. He stated he has some diarrhea and knows it is from the alcohol. He denies tremors, sweating, headaches,  nausea or vomiting. He stated he has never had complicated alcohol withdrawal. Patient is hoping to be discharged soon. He has given permission for social work to talk to his girlfriend for safety planning. Will continue to monitor for safety. Encouragement and support provided.   Associated Signs/Symptoms: Depression Symptoms:  guilt over alcohol relapse Duration of Depression Symptoms: No data recorded (Hypo) Manic Symptoms:  Patient denies Anxiety Symptoms:  Patient denies Psychotic Symptoms:  Hallucinations: None patient denies PTSD Symptoms: Negative patient denies physical, sexual and emotional trauma or abuse Total Time spent with patient: 45 minutes  Past Psychiatric History: Alcohol addiction  Is the patient at risk to self? No.  Has the patient been a risk to self in the past 6 months? No.  Has the patient been a risk to self within the distant past? No.  Is the patient a risk to others? No.  Has the patient been a risk to others in the past 6 months? No.  Has the patient been a risk to others within the distant past? No.   Prior Inpatient Therapy: [facility] in 2018 for alcohol treatment Prior Outpatient Therapy:  SAIOP at [facility], current  Alcohol Screening: 1. How often do you have a drink containing alcohol?: 4 or more times a week 2. How many drinks containing alcohol do you have on a typical day when you are drinking?: 10 or more 3. How often do you have six or more drinks on one occasion?: Daily or almost daily AUDIT-C Score: 12 4. How often during the last year have you found that you were not able to stop drinking once you had started?: Never 5. How often during the last year have you failed to do what was normally expected  from you because of drinking?: Less than monthly 6. How often during the last year have you needed a first drink in the morning to get yourself going after a heavy drinking session?: Never 7. How often during the last year have you had a feeling of guilt of remorse after drinking?: Weekly 8. How often during the last year have you been unable to remember what happened the night before because you had been drinking?: Less than monthly 9. Have you or someone else been injured as a result of your drinking?: No 10. Has a relative or friend or a doctor or another health worker been concerned about your drinking or suggested you cut down?: Yes, but not in the last year Alcohol Use Disorder Identification Test Final Score (AUDIT): 19 Alcohol Brief Interventions/Follow-up: Alcohol education/Brief advice Substance Abuse History in the last 12 months:  Yes.   Consequences of Substance Abuse: Legal Consequences:  DUI in 1996 and 1997.  Family Consequences:  Divorce and estrangement form 46 year old son Withdrawal Symptoms:   Diaphoresis Diarrhea Tremors Previous Psychotropic Medications: No  Psychological Evaluations: No  Past Medical History:  Past Medical History:  Diagnosis Date  . Depressed   . Sleep apnea     Past Surgical History:  Procedure Laterality Date  . arm surgery     . NASAL SEPTUM SURGERY    . ORIF ULNAR / RADIAL SHAFT FRACTURE     Family History:  Family History  Problem Relation Age of Onset  . Diabetes Other        grandmother   Family Psychiatric  History: PGFA with PTSD from WWII Tobacco Screening:  Patient is a smoker.  Social History:  Social History   Substance and Sexual Activity  Alcohol Use Yes  . Alcohol/week: 5.0 standard drinks  . Types: 5 Shots of liquor per week   Comment: daily      Social History   Substance and Sexual Activity  Drug Use No    Additional Social History:  Allergies:  No Known Allergies Lab Results:  Results for orders  placed or performed during the hospital encounter of 09/15/20 (from the past 48 hour(s))  Hemoglobin A1c     Status: None   Collection Time: 09/16/20  6:25 AM  Result Value Ref Range   Hgb A1c MFr Bld 4.9 4.8 - 5.6 %    Comment: (NOTE) Pre diabetes:          5.7%-6.4%  Diabetes:              >6.4%  Glycemic control for   <7.0% adults with diabetes    Mean Plasma Glucose 93.93 mg/dL    Comment: Performed at [facility], [address] [place], [place] [address]  Lipid panel     Status: Abnormal   Collection Time: 09/16/20  6:25 AM  Result Value Ref Range   Cholesterol 215 (H) 0 - 200 mg/dL   Triglycerides 85 <914<150 mg/dL   HDL 90 >78>40 mg/dL   Total CHOL/HDL Ratio 2.4 RATIO   VLDL 17 0 - 40 mg/dL   LDL Cholesterol 295108 (H) 0 - 99 mg/dL    Comment:        Total Cholesterol/HDL:CHD Risk Coronary Heart Disease Risk Table                     Men   Women  1/2 Average Risk   3.4   3.3  Average Risk       5.0   4.4  2 X Average Risk   9.6   7.1  3 X Average Risk  23.4   11.0        Use the calculated Patient Ratio above and the CHD Risk Table to determine the patient's CHD Risk.        ATP III CLASSIFICATION (LDL):  <100     mg/dL   Optimal  [id number]  mg/dL   Near or Above                    Optimal  130-159  mg/dL   Borderline  [id number]  mg/dL   High  >962>190     mg/dL   Very High Performed at [facility], [address] [place], [place] [address]   TSH     Status: Abnormal   Collection Time: 09/16/20  6:25 AM  Result Value Ref Range  TSH 5.811 (H) 0.350 - 4.500 uIU/mL    Comment: Performed by a 3rd Generation assay with a functional sensitivity of <=0.01 uIU/mL. Performed at [facility], [address] [place], [place] [address]     Blood Alcohol level:  Lab Results  Component Value Date   ETH 350 [facility]) 09/14/2020   ETH 138 (H) 11/22/2016    Metabolic Disorder Labs:  Lab Results  Component Value Date   HGBA1C 4.9  09/16/2020   MPG 93.93 09/16/2020   No results found for: PROLACTIN Lab Results  Component Value Date   CHOL 215 (H) 09/16/2020   TRIG 85 09/16/2020   HDL 90 09/16/2020   CHOLHDL 2.4 09/16/2020   VLDL 17 09/16/2020   LDLCALC 108 (H) 09/16/2020    Current Medications: Current Facility-Administered Medications  Medication Dose Route Frequency Provider Last Rate Last Admin  . acetaminophen (TYLENOL) tablet 650 mg  650 mg Oral Q6H PRN [name] A, NP      . alum & mag hydroxide-simeth (MAALOX/MYLANTA) 200-200-20 MG/5ML suspension 30 mL  30 mL Oral Q4H PRN [name] A, NP      . hydrOXYzine (ATARAX/VISTARIL) tablet 25 mg  25 mg Oral Q6H PRN [name] A, NP   25 mg at 09/15/20 2121  . loperamide (IMODIUM) capsule 2-4 mg  2-4 mg Oral PRN [name] A, NP      . LORazepam (ATIVAN) tablet 1 mg  1 mg Oral Q6H PRN [name] A, NP      . magnesium hydroxide (MILK OF MAGNESIA) suspension 30 mL  30 mL Oral Daily PRN [name] A, NP      . nicotine polacrilex (NICORETTE) gum 2 mg  2 mg Oral PRN Laveda Abbe, NP      . ondansetron (ZOFRAN-ODT) disintegrating tablet 4 mg  4 mg Oral Q6H PRN [name] A, NP      . thiamine tablet 100 mg  100 mg Oral Daily [name] A, NP   100 mg at 09/16/20 0900  . traZODone (DESYREL) tablet 50 mg  50 mg Oral QHS PRN Jackelyn Poling, NP   50 mg at 09/15/20 2121   PTA Medications: Medications Prior to Admission  Medication Sig Dispense Refill Last Dose  . apixaban (ELIQUIS) 5 MG TABS tablet Take 5 mg by mouth 2 (two) times daily.  (Patient not taking: No sig reported)     . chlordiazePOXIDE (LIBRIUM) 10 MG capsule Take 10 mg by mouth See admin instructions. Take 10 mg by mouth three times a day and taper as directed     . naproxen sodium (ALEVE) 220 MG tablet Take 220-440 mg by mouth 2 (two) times daily as needed (for headaches).     . testosterone cypionate (DEPOTESTOSTERONE CYPIONATE) 200 MG/ML injection Inject 1 mL into the muscle See  admin instructions. Every 10 days (Patient not taking: Reported on 09/14/2020)     . varenicline (CHANTIX) 1 MG tablet Take 1 mg by mouth 2 (two) times daily. (Patient not taking: No sig reported)       Musculoskeletal: Strength & Muscle Tone: within normal limits Gait & Station: normal Patient leans: N/A  Psychiatric Specialty Exam:  Presentation  General Appearance: Appropriate for Environment; Casual; Fairly Groomed  Eye Contact:Good  Speech:Clear and Coherent; Normal Rate  Speech Volume:Normal  Handedness:Right  Mood and Affect  Mood:Euthymic  Affect:Appropriate; Congruent  Thought Process  Thought Processes:Coherent; Goal Directed; Linear  Duration of Psychotic Symptoms: No data  recorded Past Diagnosis of Schizophrenia or Psychoactive disorder: No data recorded Descriptions of Associations:Intact  Orientation:Full (Time, Place and Person)  Thought Content:Logical  Hallucinations:Hallucinations: None  Ideas of Reference:None  Suicidal Thoughts:Suicidal Thoughts: No  Homicidal Thoughts:Homicidal Thoughts: No  Sensorium  Memory:Immediate Fair; Recent Fair; Remote Fair  Judgment:Fair  Insight:Fair  Executive Functions  Concentration:Good  Attention Span:Good  Recall:Good  Fund of Knowledge:Good  Language:Good  Psychomotor Activity  Psychomotor Activity:Psychomotor Activity: Normal  Assets  Assets:Communication Skills; Financial Resources/Insurance; Housing; Physical Health; Social Support; Transportation  Sleep  Sleep:Sleep: Fair  Physical Exam: Physical Exam Vitals and nursing note reviewed.  Constitutional:      Appearance: Normal appearance.  HENT:     Head: Normocephalic.  Pulmonary:     Effort: Pulmonary effort is normal.  Musculoskeletal:        General: Normal range of motion.     Cervical back: Normal range of motion.  Neurological:     Mental Status: He is alert and oriented to person, place, and time.  Psychiatric:         Attention and Perception: Attention normal. He does not perceive auditory or visual hallucinations.        Mood and Affect: Mood normal.        Speech: Speech normal.        Behavior: Behavior normal. Behavior is cooperative.        Thought Content: Thought content normal. Thought content is not paranoid or delusional. Thought content does not include homicidal or suicidal ideation. Thought content does not include homicidal or suicidal plan.        Cognition and Memory: Cognition normal.    Review of Systems  Constitutional: Negative for fever.  HENT: Negative for congestion and sore throat.   Respiratory: Negative for cough, shortness of breath and wheezing.   Cardiovascular: Negative for chest pain.  Gastrointestinal: Negative.   Genitourinary: Negative.   Musculoskeletal: Negative.   Neurological: Negative.    Blood pressure 119/83, pulse 77, temperature 97.6 F (36.4 C), temperature source Oral, resp. rate 18, height 5\' 9"  (1.753 m), weight 135.2 kg, SpO2 100 %. Body mass index is 44.01 kg/m.  Treatment Plan Summary: 1. Admit for crisis management and stabilization, estimated length of stay 3-5 days.    2. Medication management to reduce current symptoms to base line and improve the patient's overall level of functioning: See [facility], MD's SRA & treatment plan.   Observation Level/Precautions:  15 minute checks  Laboratory: CMP with Calcium 8.2, Albumin 3.2, AST 58, ALT 110, CBC with Diff with MCV 108.2, MCH 36.9, Lipid panel LDL 108, Cholesterol 215, TSH 5.811, will add T3 and T4 to previous collection,  A1c 4.9  Psychotherapy:  Group therapy  Medications:  See MAR  Consultations:  TBD  Discharge Concerns:  Safety, suicidality, alcohol relapse  Estimated LOS: 2-4  Other:     Physician Treatment Plan for Primary Diagnosis: Alcohol abuse with alcohol-induced mood disorder (HCC) Long Term Goal(s): Improvement in symptoms so as ready for discharge  Short Term Goals: Ability to  identify changes in lifestyle to reduce recurrence of condition will improve, Ability to verbalize feelings will improve, Ability to disclose and discuss suicidal ideas, Ability to identify and develop effective coping behaviors will improve and Ability to identify triggers associated with substance abuse/mental health issues will improve    I certify that inpatient services furnished can reasonably be expected to improve the patient's condition.    [facility], NP 4/20/20221:24 PM

## 2020-09-16 NOTE — BHH Suicide Risk Assessment (Signed)
[facility] Admission Suicide Risk Assessment   Nursing information obtained from:  Patient Demographic factors:  Male,Caucasian Current Mental Status:  NA Loss Factors:  Decline in physical health Historical Factors:  Impulsivity,Family history of mental illness or substance abuse Risk Reduction Factors:  Living with another person, especially a relative,Positive social support,Positive therapeutic relationship  Total Time spent with patient: 50 minutes Principal Problem: Alcohol abuse with alcohol-induced mood disorder (HCC) Diagnosis:  Principal Problem:   Alcohol abuse with alcohol-induced mood disorder (HCC) Active Problems:   Substance induced mood disorder (HCC)  Subjective Data: "I need help getting back into treatment."  History of Present Illness: (From TTS assessment note): Richard Osborn is a 45 year old male [facility] and [facility]. Patient stated, "I called out of work and unsure what I said, my boss thought I sounded suicidal so the police came out to the house". Patient vaguely remembered stating "I was having a bad day, I messed up for drinking, I don't want to be here". Patient reported the police took his gun when they came out to the home. Patient was holding gun in hand when police arrived to his home. Patient reported to police that he wanted help. Patient denied prior psych hospitalization, suicide attempts and self-harming behaviors. Patient denied current SI. Patient denied HI and psychosis. Patient is currently being seen at [facility] for alcohol detox treatment. Patient reported drinking 1 quart of beer last night.   Patient resides with girlfriend. Patient has a 58 year old child lives with child's mother. Patient reported discord with girlfriend. Patient is currently employed and denied work-related stressors, stating "I love my job". Patient reported no other guns in the home after police took his gun.  Evaluation on the unit:  Patient is a 45 year old male who presented to the [facility], vis [employer], after he called his employer while intoxicated and made statements that had the employer worried he was going to harm himself. This represents his first inpatient psychiatric admission. He was at [facility] in 2018 and most recently has been attending SA[facility]OP at [facility]. He plans to go back to [facility] to continue his treatment.    Patient was seen and evaluated on the unit today. He is pleasant on approach, calm and cooperative. He stated he has had a battle with alcohol since 1996 when he got his first DUI, his second DUI was in 1997,  He lost his license for a time and has had no additional DUI's.  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 He stated "I know better but I did it anyway." He stated he drank half of it that night and the rest Tuesday morning after he got up. He called his employer and said "some stupid things." She called the cops and he was taken tot he hospital, then admitted to [facility]. Patient stated he had a gun when the cops showed up but had no intention of doing anything with it. The cops took the gun from him. He lives with his girlfriend and her brother but was home alone at the time. He denies SI/HI/AVH, paranoia and delusions. He denies depressive symptoms. He stated he feels guilty over relapsing. He denies symptoms of mania, denies physical, emotional and sexual trauma or abuse. He stated he has never been treated for depression, anxiety, or any mental health problem.  He has had previous alcohol treatment at [facility] in 2018. He does not have a sponsor currently but does go to [employer] He is in SAIOP at the [facility] since 2 weeks ago. He is divorced. He has a 42 year old son, living in [place], but is not currently seeing him because he is trying to get his alcoholism under control. He stated "I don't want  him to see me like this, I want to be better for him." Patient is forward thinking and goal oriented. 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 He stated " I know I have to make this work, I can not lose my license, it is my livelihood." Patient is motivated, goal oriented and forward thinking.    He denies allergies to medications. He denies physical health problems with the exception of sleep apnea, he has a C-PAP machine at home. He stated he has some diarrhea and knows it is from the alcohol. He denies tremors, sweating, headaches,  nausea or vomiting. He stated he has never had complicated alcohol withdrawal. Patient is hoping to be discharged soon. He has given permission for social work to talk to his girlfriend for safety planning. Will continue to monitor for safety. Encouragement and support provided.   Associated Signs/Symptoms: Depression Symptoms:  guilt over alcohol relapse Duration of Depression Symptoms: No data recorded (Hypo) Manic Symptoms:  Patient denies Anxiety Symptoms:  Patient denies Psychotic Symptoms:  Hallucinations: None patient denies PTSD Symptoms: Negative patient denies physical, sexual and emotional trauma or abuse   Past Psychiatric History: Alcohol addiction  Is the patient at risk to self? No.  Has the patient been a risk to self in the past 6 months? No.  Has the patient been a risk to self within the distant past? No.  Is the patient a risk to others? No.  Has the patient been a risk to others in the past 6 months? No.  Has the patient been a risk to others within the distant past? No.   Prior Inpatient Therapy: [facility] in 2018 for alcohol treatment Prior Outpatient Therapy:  SAIOP at [facility], current   Continued Clinical Symptoms:  Alcohol Use Disorder Identification Test Final Score (AUDIT): 19 The "Alcohol Use Disorders Identification Test", Guidelines for Use in Primary Care, Second Edition.  World Environmental consultant [facility]). Score between 0-7:  no or low risk or alcohol related problems. Score between 8-15:  moderate risk of alcohol related problems. Score between 16-19:  high risk of alcohol related problems. Score 20 or above:  warrants further diagnostic evaluation for alcohol dependence and treatment.   CLINICAL FACTORS:   Severe Anxiety and/or Agitation Alcohol/Substance Abuse/Dependencies    Musculoskeletal: Strength & Muscle Tone: within normal limits Gait & Station: normal Patient leans: N/A  Psychiatric Specialty Exam:  Presentation  General Appearance: Appropriate for Environment; Casual; Fairly Groomed  Eye Contact:Good  Speech:Clear and Coherent; Normal Rate  Speech Volume:Normal  Handedness:Right  Mood and Affect  Mood:Euthymic  Affect:Appropriate; Congruent  Thought Process  Thought Processes:Coherent; Goal Directed; Linear  Duration of Psychotic Symptoms: No data recorded Past Diagnosis of Schizophrenia or Psychoactive disorder: No data recorded Descriptions of Associations:Intact  Orientation:Full (Time, Place and Person)  Thought Content:Logical  Hallucinations:Hallucinations: None  Ideas of Reference:None  Suicidal Thoughts:Suicidal Thoughts: No  Homicidal Thoughts:Homicidal Thoughts: No  Sensorium  Memory:Immediate Fair; Recent Fair; Remote Fair  Judgment:Fair  Insight:Fair  Executive Functions  Concentration:Good  Attention Span:Good  Recall:Good  Fund of Knowledge:Good  Language:Good  Psychomotor Activity  Psychomotor Activity:Psychomotor Activity: Normal  Assets  Assets:Communication Skills; Financial Resources/Insurance; Housing; Physical Health; Social Support; Transportation  Sleep  Sleep:Sleep: Fair  Physical Exam: Physical Exam Vitals and nursing note reviewed.  Constitutional:      Appearance: Normal appearance.  HENT:     Head: Normocephalic.  Pulmonary:     Effort:  Pulmonary effort is normal.  Musculoskeletal:        General: Normal range of motion.     Cervical back: Normal range of motion.  Neurological:     Mental Status: He is alert and oriented to person, place, and time.  Psychiatric:        Attention and Perception: Attention normal. He does not perceive auditory or visual hallucinations.        Mood and Affect: Mood normal.        Speech: Speech normal.        Behavior: Behavior normal. Behavior is cooperative.        Thought Content: Thought content normal. Thought content is not paranoid or delusional. Thought content does not include homicidal or suicidal ideation. Thought content does not include homicidal or suicidal plan.        Cognition and Memory: Cognition normal.    Review of Systems  Constitutional: Negative for fever.  HENT: Negative for congestion and sore throat.   Respiratory: Negative for cough, shortness of breath and wheezing.   Cardiovascular: Negative for chest pain.  Gastrointestinal: Negative.   Genitourinary: Negative.   Musculoskeletal: Negative.   Neurological: Negative.    Psychiatric Specialty Exam:  Presentation  General Appearance: Appropriate for Environment; Casual; Fairly Groomed  Eye Contact:Good  Speech:Clear and Coherent; Normal Rate  Speech Volume:Normal  Handedness:Right   Mood and Affect  Mood:Euthymic  Affect:Appropriate; Congruent   Thought Process  Thought Processes:Coherent; Goal Directed; Linear  Descriptions of Associations:Intact  Orientation:Full (Time, Place and Person)  Thought Content:Logical  History of Schizophrenia/Schizoaffective disorder:No data recorded Duration of Psychotic Symptoms:No data recorded Hallucinations:Hallucinations: None  Ideas of Reference:None  Suicidal Thoughts:Suicidal Thoughts: No  Homicidal Thoughts:Homicidal Thoughts: No   Sensorium  Memory:Immediate Fair; Recent Fair; Remote  Fair  Judgment:Fair  Insight:Fair   Executive Functions  Concentration:Good  Attention Span:Good  Recall:Good  Fund of Knowledge:Good  Language:Good   Psychomotor Activity  Psychomotor Activity:Psychomotor Activity: Normal   Assets  Assets:Communication Skills; Financial Resources/Insurance; Housing; Physical Health; Social Support; Transportation   Sleep  Sleep:Sleep: Fair    Blood pressure 120/88, pulse 69, temperature 98 F (36.7 C), temperature source Oral, resp. rate 18, height  (1.753 m), weight 135.2 kg, SpO2 100 %. Body mass index is 44.01 kg/m.   COGNITIVE FEATURES THAT CONTRIBUTE TO RISK:  Polarized thinking    SUICIDE RISK:   Moderate:  Frequent suicidal ideation with limited intensity, and duration, some specificity in terms of plans, no associated intent, good self-control, limited dysphoria/symptomatology, some risk factors present, and identifiable protective factors, including available and accessible social support.  PLAN OF CARE:  Treatment Plan Summary: 1. Admit for crisis management and stabilization, estimated length of stay 3-5 days.   2. Medication management to reduce current symptoms to base line and improve the patient's overall level of functioning:See MAR, MD's SRA &treatment plan.  Observation Level/Precautions:  15 minute checks  Laboratory: CMP with Calcium 8.2, Albumin 3.2, AST 58, ALT 110, CBC with Diff with MCV 108.2, MCH 36.9, Lipid panel LDL 108, Cholesterol 215, TSH 5.811, will  add T3 and T4 to previous collection,  A1c 4.9  Psychotherapy:  Group therapy  Medications:  See MAR  Consultations:  TBD  Discharge Concerns:  Safety, suicidality, alcohol relapse  Estimated LOS: 2-4  Other:     Physician Treatment Plan for Primary Diagnosis: Alcohol abuse with alcohol-induced mood disorder (HCC) Long Term Goal(s): Improvement in symptoms so as ready for discharge  Short Term Goals: Ability to identify changes in  lifestyle to reduce recurrence of condition will improve, Ability to verbalize feelings will improve, Ability to disclose and discuss suicidal ideas, Ability to identify and develop effective coping behaviors will improve and Ability to identify triggers associated with substance abuse/mental health issues will improve      I certify that inpatient services furnished can reasonably be expected to improve the patient's condition.   Mariel Craft, MD 09/16/2020, 7:52 PM

## 2020-09-16 NOTE — Plan of Care (Signed)
Nurse discussed anxiety, depression and coping skills with patient.  

## 2020-09-16 NOTE — BHH Group Notes (Signed)
The focus of this group is to help patients establish daily goals to achieve during treatment and discuss how the patient can incorporate goal setting into their daily lives to aide in recovery.  Pt did not attend group 

## 2020-09-16 NOTE — Tx Team (Signed)
Interdisciplinary Treatment and Diagnostic Plan Update  09/16/2020 Time of Session: 9:20am  Richard Osborn MRN: [id number]  Principal Diagnosis: <principal problem not specified>  Secondary Diagnoses: Active Problems:   Substance induced mood disorder (HCC)   Current Medications:  Current Facility-Administered Medications  Medication Dose Route Frequency Provider Last Rate Last Admin  . acetaminophen (TYLENOL) tablet 650 mg  650 mg Oral Q6H PRN Lindon Romp A, NP      . alum & mag hydroxide-simeth (MAALOX/MYLANTA) 200-200-20 MG/5ML suspension 30 mL  30 mL Oral Q4H PRN Lindon Romp A, NP      . hydrOXYzine (ATARAX/VISTARIL) tablet 25 mg  25 mg Oral Q6H PRN Lindon Romp A, NP   25 mg at 09/15/20 2121  . loperamide (IMODIUM) capsule 2-4 mg  2-4 mg Oral PRN Lindon Romp A, NP      . LORazepam (ATIVAN) tablet 1 mg  1 mg Oral Q6H PRN Lindon Romp A, NP      . magnesium hydroxide (MILK OF MAGNESIA) suspension 30 mL  30 mL Oral Daily PRN Lindon Romp A, NP      . nicotine polacrilex (NICORETTE) gum 2 mg  2 mg Oral PRN Ethelene Hal, NP      . ondansetron (ZOFRAN-ODT) disintegrating tablet 4 mg  4 mg Oral Q6H PRN Lindon Romp A, NP      . thiamine tablet 100 mg  100 mg Oral Daily Lindon Romp A, NP   100 mg at 09/16/20 0900  . traZODone (DESYREL) tablet 50 mg  50 mg Oral QHS PRN Rozetta Nunnery, NP   50 mg at 09/15/20 2121   PTA Medications: Medications Prior to Admission  Medication Sig Dispense Refill Last Dose  . apixaban (ELIQUIS) 5 MG TABS tablet Take 5 mg by mouth 2 (two) times daily.  (Patient not taking: No sig reported)     . chlordiazePOXIDE (LIBRIUM) 10 MG capsule Take 10 mg by mouth See admin instructions. Take 10 mg by mouth three times a day and taper as directed     . naproxen sodium (ALEVE) 220 MG tablet Take 220-440 mg by mouth 2 (two) times daily as needed (for headaches).     . testosterone cypionate (DEPOTESTOSTERONE CYPIONATE) 200 MG/ML injection Inject 1 mL into  the muscle See admin instructions. Every 10 days (Patient not taking: Reported on 09/14/2020)     . varenicline (CHANTIX) 1 MG tablet Take 1 mg by mouth 2 (two) times daily. (Patient not taking: No sig reported)       Patient Stressors: Health problems Substance abuse Other:  Pt concerned that he will miss too much work  Patient Strengths: Ability for insight Active sense of humor Average or above average intelligence Capable of independent living Occupational psychologist fund of knowledge Motivation for treatment/growth Supportive family/friends Work skills  Treatment Modalities: Medication Management, Group therapy, Case management,  1 to 1 session with clinician, Psychoeducation, Recreational therapy.   Physician Treatment Plan for Primary Diagnosis: <principal problem not specified> Long Term Goal(s):     Short Term Goals:    Medication Management: Evaluate patient's response, side effects, and tolerance of medication regimen.  Therapeutic Interventions: 1 to 1 sessions, Unit Group sessions and Medication administration.  Evaluation of Outcomes: Not Met  Physician Treatment Plan for Secondary Diagnosis: Active Problems:   Substance induced mood disorder ([place])  Long Term Goal(s):     Short Term Goals:       Medication Management: Evaluate patient's response,  side effects, and tolerance of medication regimen.  Therapeutic Interventions: 1 to 1 sessions, Unit Group sessions and Medication administration.  Evaluation of Outcomes: Not Met   RN Treatment Plan for Primary Diagnosis: <principal problem not specified> Long Term Goal(s): Knowledge of disease and therapeutic regimen to maintain health will improve  Short Term Goals: Ability to remain free from injury will improve, Ability to participate in decision making will improve, Ability to verbalize feelings will improve, Ability to disclose and discuss suicidal ideas and Ability to identify and  develop effective coping behaviors will improve  Medication Management: RN will administer medications as ordered by provider, will assess and evaluate patient's response and provide education to patient for prescribed medication. RN will report any adverse and/or side effects to prescribing provider.  Therapeutic Interventions: 1 on 1 counseling sessions, Psychoeducation, Medication administration, Evaluate responses to treatment, Monitor vital signs and CBGs as ordered, Perform/monitor CIWA, COWS, AIMS and Fall Risk screenings as ordered, Perform wound care treatments as ordered.  Evaluation of Outcomes: Not Met   LCSW Treatment Plan for Primary Diagnosis: <principal problem not specified> Long Term Goal(s): Safe transition to appropriate next level of care at discharge, Engage patient in therapeutic group addressing interpersonal concerns.  Short Term Goals: Engage patient in aftercare planning with referrals and resources, Increase social support, Increase emotional regulation, Facilitate acceptance of mental health diagnosis and concerns, Identify triggers associated with mental health/substance abuse issues and Increase skills for wellness and recovery  Therapeutic Interventions: Assess for all discharge needs, 1 to 1 time with Social worker, Explore available resources and support systems, Assess for adequacy in community support network, Educate family and significant other(s) on suicide prevention, Complete Psychosocial Assessment, Interpersonal group therapy.  Evaluation of Outcomes: Not Met   Progress in Treatment: Attending groups: Yes. Participating in groups: Yes. Taking medication as prescribed: Yes. Toleration medication: Yes. Family/Significant other contact made: No, will contact:  If consents are provided  Patient understands diagnosis: Yes. and No. Discussing patient identified problems/goals with staff: Yes. Medical problems stabilized or resolved: Yes. Denies  suicidal/homicidal ideation: Yes. Issues/concerns per patient self-inventory: No.   New problem(s) identified: No, Describe:  None   New Short Term/Long Term Goal(s): medication stabilization, elimination of SI thoughts, development of comprehensive mental wellness plan.   Patient Goals:  "To get back to work and my substance use treatment"  Discharge Plan or Barriers: Patient recently admitted. CSW will continue to follow and assess for appropriate referrals and possible discharge planning.   Reason for Continuation of Hospitalization: Depression Medication stabilization Suicidal ideation  Estimated Length of Stay: 3 to 5 days   Attendees: Patient: Richard Osborn 09/16/2020   Physician: Melba Coon, MD 09/16/2020   Nursing:  09/16/2020   RN Care Manager: 09/16/2020   Social Worker: Verdis Frederickson, [place] 09/16/2020   Recreational Therapist:  09/16/2020   Other:  09/16/2020   Other:  09/16/2020   Other: 09/16/2020     Scribe for Treatment Team: Darleen Crocker, LCSWA 09/16/2020 11:13 AM

## 2020-09-16 NOTE — Progress Notes (Signed)
Pt stated he was feeling about the same , pt visible on the unit this evening. Pt given PRN Trazodone and Vistaril per [facility]    09/16/20 2300  Psych Admission Type (Psych Patients Only)  Admission Status Involuntary  Psychosocial Assessment  Patient Complaints Anxiety  Eye Contact Brief  Facial Expression Flat  Affect Angry;Anxious;Depressed  Speech Logical/coherent  Interaction Assertive  Motor Activity Hand-wringing  Appearance/Hygiene Unremarkable;In scrubs  Behavior Characteristics Anxious  Mood Anxious  Thought Process  Coherency WDL  Content Blaming self  Delusions None reported or observed  Perception WDL  Hallucination None reported or observed  Judgment Poor  Confusion WDL  Danger to Self  Current suicidal ideation? Denies  Danger to Others  Danger to Others None reported or observed

## 2020-09-16 NOTE — Progress Notes (Signed)
   09/16/20 0609  Vital Signs  Pulse Rate 77  Pulse Rate Source Monitor  BP 119/83  BP Location Right Arm  BP Method Automatic  Patient Position (if appropriate) Standing  Oxygen Therapy  SpO2 100 %  Sleep  Number of Hours 6.5

## 2020-09-16 NOTE — Progress Notes (Signed)
Recreation Therapy Notes  Date: 4.20.22 Time: 0930 Location: [address] Dayroom  Group Topic: Stress Management   Goal Area(s) Addresses:  Patient will actively participate in stress management techniques presented during session.  Patient will successfully identify benefit of practicing stress management post d/c.   Intervention: Guided exercise with ambient sound and script  Activity :Guided Imagery  LRT read a script that encouraged patients to envision being in their peaceful place.  Patients were to imagine the sights, sounds and smells of the place they are envisioning.    Education:  Stress Management, Discharge Planning.   Education Outcome: Acknowledges education  Clinical Observations/Feedback: Patient did not attend group session.   Caroll Rancher, LRT/CTRS         Caroll Rancher A 09/16/2020 11:37 AM

## 2020-09-16 NOTE — Progress Notes (Signed)
[facility] Group Notes:  (Nursing/MHT/Case Management/Adjunct)  Date:  09/16/2020  Time:  2015  Type of Therapy:  wrap up group  Participation Level:  Active  Participation Quality:  Appropriate, Attentive, Sharing and Supportive  Affect:  Depressed  Cognitive:  Appropriate  Insight:  Improving  Engagement in Group:  Engaged  Modes of Intervention:  Clarification, Education and Support  Summary of Progress/Problems: Positive thinking and positive change were discussed.  Marcille Buffy 09/16/2020, 9:37 PM

## 2020-09-17 DIAGNOSIS — F1014 Alcohol abuse with alcohol-induced mood disorder: Secondary | ICD-10-CM

## 2020-09-17 NOTE — Progress Notes (Signed)
D:  Patient's self inventory sheet, patient has fair sleep, sleep medication helpful.  Good appetite, normal energy level, good concentration.  Rated depression 1, hopeless and anxiety zero.  Denied withdrawals.  Denied SI.  Physical problems denied.  Physical pain, feet, rated pain #5.  Goal is staying sober.  Plans to remain focused on my own goal and future.  Does have discharge plans. A:   Medications administered per MD orders.  Emotional support and encouragement given patient. R:  Denied SI and HI, contracts for safety.  Denied A/V hallucinations.  Safety maintained with 15 minute checks.

## 2020-09-17 NOTE — Plan of Care (Signed)
Nurse discussed coping skills with patient.  

## 2020-09-17 NOTE — Progress Notes (Signed)
Pt stated he was steadily doing better, pt anxious about when he will D/C. Pt given PRN Trazodone and Vistaril per [facility]    09/17/20 2100  Psych Admission Type (Psych Patients Only)  Admission Status Involuntary  Psychosocial Assessment  Patient Complaints Worrying;Depression  Eye Contact Brief  Facial Expression Flat  Affect Angry;Anxious;Depressed  Speech Logical/coherent  Interaction Assertive  Motor Activity Hand-wringing  Appearance/Hygiene Unremarkable;In scrubs  Behavior Characteristics Anxious  Mood Anxious;Depressed  Thought Process  Coherency WDL  Content Blaming self  Delusions None reported or observed  Perception WDL  Hallucination None reported or observed  Judgment Poor  Confusion WDL  Danger to Self  Current suicidal ideation? Denies  Danger to Others  Danger to Others None reported or observed

## 2020-09-17 NOTE — Progress Notes (Incomplete)
[facility] MD Progress Note  09/17/2020 4:25 PM Richard Osborn  MRN:  [id number]  Subjective: Richard Osborn reports, "I feel great, just bored for being here. I have no symptoms of depression, no anxiety".  Objective: Richard Osborn is a 45 year old male [facility] and [facility]. Patient stated, "I called out of work and unsure what I said, my boss thought I sounded suicidal so the police came out to the house". Patient vaguely remembered stating "I was having a bad day, I messed up for drinking, I don't want to be here". Patient reported the police took his gun when they came out to the home. Patient was holding gun in hand when police arrived to his home. Patient reported to police that he wanted help. Patient denied prior psych hospitalization, suicide attempts and self-harming behaviors. Daily notes:  Richard Osborn is seen, chart reviewed. The chart findings discussed with the treatment team. He presents alert, oriented x 4. He is visible on the unit, attending group sessions. He says he id doing great, just bored for being here. He says he was brought to the haopistal because he had so much to drink, got dr  Principal Problem: Alcohol abuse with alcohol-induced mood disorder (HCC)  Diagnosis: Principal Problem:   Alcohol abuse with alcohol-induced mood disorder (HCC) Active Problems:   Substance induced mood disorder (HCC)  Total Time spent with patient: 25 minutes  Past Psychiatric History: ***  Past Medical History:  Past Medical History:  Diagnosis Date  . Depressed   . Sleep apnea     Past Surgical History:  Procedure Laterality Date  . arm surgery     . NASAL SEPTUM SURGERY    . ORIF ULNAR / RADIAL SHAFT FRACTURE     Family History:  Family History  Problem Relation Age of Onset  . Diabetes Other        grandmother   Family Psychiatric  History: See H&P  Social History:  Social History   Substance and Sexual Activity  Alcohol Use Yes  . Alcohol/week:  5.0 standard drinks  . Types: 5 Shots of liquor per week   Comment: daily      Social History   Substance and Sexual Activity  Drug Use No    Social History   Socioeconomic History  . Marital status: Divorced    Spouse name: Not on file  . Number of children: Y  . Years of education: Not on file  . Highest education level: GED or equivalent  Occupational History  . Occupation: bus driver    Comment: for [employer]  Tobacco Use  . Smoking status: Former Smoker    Packs/day: 1.00    Years: 20.00    Pack years: 20.00    Types: Cigarettes  . Smokeless tobacco: Never Used  Vaping Use  . Vaping Use: Never used  Substance and Sexual Activity  . Alcohol use: Yes    Alcohol/week: 5.0 standard drinks    Types: 5 Shots of liquor per week    Comment: daily   . Drug use: No  . Sexual activity: Not on file  Other Topics Concern  . Not on file  Social History Narrative   Pt is a past EMT   Social Determinants of Health   Financial Resource Strain: Not on file  Food Insecurity: Not on file  Transportation Needs: Not on file  Physical Activity: Not on file  Stress: Not on file  Social Connections: Not on file  Additional Social History:                         Sleep: {BHH GOOD/FAIR/POOR:22877}  Appetite:  {BHH GOOD/FAIR/POOR:22877}  Current Medications: Current Facility-Administered Medications  Medication Dose Route Frequency Provider Last Rate Last Admin  . acetaminophen (TYLENOL) tablet 650 mg  650 mg Oral Q6H PRN Nira ConnBerry, Jason A, NP   650 mg at 09/17/20 0855  . alum & mag hydroxide-simeth (MAALOX/MYLANTA) 200-200-20 MG/5ML suspension 30 mL  30 mL Oral Q4H PRN Nira ConnBerry, Jason A, NP      . hydrOXYzine (ATARAX/VISTARIL) tablet 25 mg  25 mg Oral Q6H PRN Nira ConnBerry, Jason A, NP   25 mg at 09/16/20 2130  . loperamide (IMODIUM) capsule 2-4 mg  2-4 mg Oral PRN Nira ConnBerry, Jason A, NP      . LORazepam (ATIVAN) tablet 1 mg  1 mg Oral Q6H PRN Nira ConnBerry, Jason A, NP      . magnesium  hydroxide (MILK OF MAGNESIA) suspension 30 mL  30 mL Oral Daily PRN Nira ConnBerry, Jason A, NP      . nicotine polacrilex (NICORETTE) gum 2 mg  2 mg Oral PRN Laveda AbbeParks, Laurie Britton, NP   2 mg at 09/17/20 0859  . ondansetron (ZOFRAN-ODT) disintegrating tablet 4 mg  4 mg Oral Q6H PRN Nira ConnBerry, Jason A, NP      . pantoprazole (PROTONIX) EC tablet 40 mg  40 mg Oral Daily PRN Laveda AbbeParks, Laurie Britton, NP      . thiamine tablet 100 mg  100 mg Oral Daily Nira ConnBerry, Jason A, NP   100 mg at 09/17/20 0853  . traZODone (DESYREL) tablet 50 mg  50 mg Oral QHS PRN Jackelyn PolingBerry, Jason A, NP   50 mg at 09/16/20 2130    Lab Results:  Results for orders placed or performed during the hospital encounter of 09/15/20 (from the past 48 hour(s))  Hemoglobin A1c     Status: None   Collection Time: 09/16/20  6:25 AM  Result Value Ref Range   Hgb A1c MFr Bld 4.9 4.8 - 5.6 %    Comment: (NOTE) Pre diabetes:          5.7%-6.4%  Diabetes:              >6.4%  Glycemic control for   <7.0% adults with diabetes    Mean Plasma Glucose 93.93 mg/dL    Comment: Performed at [facility], [address] [place], [place] [address]  Lipid panel     Status: Abnormal   Collection Time: 09/16/20  6:25 AM  Result Value Ref Range   Cholesterol 215 (H) 0 - 200 mg/dL   Triglycerides 85 <604<150 mg/dL   HDL 90 >54>40 mg/dL   Total CHOL/HDL Ratio 2.4 RATIO   VLDL 17 0 - 40 mg/dL   LDL Cholesterol 098108 (H) 0 - 99 mg/dL    Comment:        Total Cholesterol/HDL:CHD Risk Coronary Heart Disease Risk Table                     Men   Women  1/2 Average Risk   3.4   3.3  Average Risk       5.0   4.4  2 X Average Risk   9.6   7.1  3 X Average Risk  23.4   11.0        Use the calculated Patient Ratio above and the CHD  Risk Table to determine the patient's CHD Risk.        ATP III CLASSIFICATION (LDL):  <100     mg/dL   Optimal  696-789  mg/dL   Near or Above                    Optimal  130-159  mg/dL   Borderline  381-017  mg/dL   High  >510      mg/dL   Very High Performed at [facility], [address], [place], [place] [address]   TSH     Status: Abnormal   Collection Time: 09/16/20  6:25 AM  Result Value Ref Range   TSH 5.811 (H) 0.350 - 4.500 uIU/mL    Comment: Performed by a 3rd Generation assay with a functional sensitivity of <=0.01 uIU/mL. Performed at [facility], [address] [place], [place] [address]   T4, free     Status: None   Collection Time: 09/16/20  6:25 AM  Result Value Ref Range   Free T4 0.75 0.61 - 1.12 ng/dL    Comment: (NOTE) Biotin ingestion may interfere with free T4 tests. If the results are inconsistent with the TSH level, previous test results, or the clinical presentation, then consider biotin interference. If needed, order repeat testing after stopping biotin. Performed at [facility], [address], [place], [place] [address]     Blood Alcohol level:  Lab Results  Component Value Date   ETH 350 (HH) 09/14/2020   ETH 138 (H) 11/22/2016    Metabolic Disorder Labs: Lab Results  Component Value Date   HGBA1C 4.9 09/16/2020   MPG 93.93 09/16/2020   No results found for: PROLACTIN Lab Results  Component Value Date   CHOL 215 (H) 09/16/2020   TRIG 85 09/16/2020   HDL 90 09/16/2020   CHOLHDL 2.4 09/16/2020   VLDL 17 09/16/2020   LDLCALC 108 (H) 09/16/2020    Physical Findings: AIMS:  , ,  ,  ,    CIWA:  CIWA-Ar Total: 1 COWS:     Musculoskeletal: Strength & Muscle Tone: {desc; muscle tone:32375} Gait & Station: {PE GAIT ED RWER:15400} Patient leans: {Patient Leans:21022755}  Psychiatric Specialty Exam:  Presentation  General Appearance: Appropriate for Environment; Casual; Fairly Groomed  Eye Contact:Good  Speech:Clear and Coherent; Normal Rate  Speech Volume:Normal  Handedness:Right   Mood and Affect  Mood:Euthymic  Affect:Appropriate; Congruent   Thought Process  Thought Processes:Coherent; Goal  Directed; Linear  Descriptions of Associations:Intact  Orientation:Full (Time, Place and Person)  Thought Content:Logical  History of Schizophrenia/Schizoaffective disorder:No data recorded Duration of Psychotic Symptoms:No data recorded Hallucinations:Hallucinations: None  Ideas of Reference:None  Suicidal Thoughts:Suicidal Thoughts: No  Homicidal Thoughts:Homicidal Thoughts: No   Sensorium  Memory:Immediate Fair; Recent Fair; Remote Fair  Judgment:Fair  Insight:Fair   Executive Functions  Concentration:Good  Attention Span:Good  Recall:Good  Fund of Knowledge:Good  Language:Good   Psychomotor Activity  Psychomotor Activity:Psychomotor Activity: Normal   Assets  Assets:Communication Skills; Financial Resources/Insurance; Housing; Physical Health; Social Support; Transportation   Sleep  Sleep:Sleep: Fair    Physical Exam: Physical Exam Vitals and nursing note reviewed.  HENT:     Head: Normocephalic.     Nose: Nose normal.     Mouth/Throat:     Pharynx: Oropharynx is clear.  Eyes:     Pupils: Pupils are equal, round, and reactive to light.  Cardiovascular:     Rate and Rhythm: Normal rate.  Pulses: Normal pulses.  Pulmonary:     Effort: Pulmonary effort is normal.  Genitourinary:    Comments: Deferred Musculoskeletal:        General: Normal range of motion.     Cervical back: Normal range of motion.  Skin:    General: Skin is warm and dry.  Neurological:     General: No focal deficit present.     Mental Status: He is alert and oriented to person, place, and time. Mental status is at baseline.    Review of Systems  Constitutional: Negative.   HENT: Negative.   Eyes: Negative.   Respiratory: Negative.   Cardiovascular: Negative.   Gastrointestinal: Negative for abdominal pain, blood in stool, constipation, diarrhea, heartburn, melena, nausea and vomiting.  Genitourinary: Negative for dysuria.  Musculoskeletal: Negative for joint  pain and myalgias.  Skin: Negative.   Neurological: Negative for dizziness, tingling, tremors, sensory change, speech change, focal weakness, seizures, loss of consciousness, weakness and headaches.  Endo/Heme/Allergies:       Allergies: NKDA  Psychiatric/Behavioral: Positive for substance abuse (Hx. alcoholism). Negative for depression, hallucinations, memory loss and suicidal ideas. The patient is not nervous/anxious and does not have insomnia.    Blood pressure 133/71, pulse 76, temperature (!) 97.5 F (36.4 C), temperature source Oral, resp. rate 18, height 5\' 9"  (1.753 m), weight 135.2 kg, SpO2 100 %. Body mass index is 44.01 kg/m.  Treatment Plan Summary: Daily contact with patient to assess and evaluate symptoms and progress in treatment and Medication management  , NP 09/17/2020, 4:25 PM

## 2020-09-17 NOTE — Progress Notes (Signed)
[facility] MD Progress Note  09/17/2020 4:38 PM Richard Osborn  MRN:  [id number]  Subjective: Richard Osborn reports, "I feel great, just bored for being here. I have no symptoms of depression, no anxiety".  Objective: Richard Osborn is a 45 year old male presenting [facility]. Patient stated, "I called out of work and unsure what I said, my boss thought I sounded suicidal so the police came out to the house". Patient vaguely remembered stating "I was having a bad day, I messed up for drinking, I don't want to be here". Patient reported the police took his gun when they came out to the home. Patient was holding gun in hand when police arrived to his home. Patient reported to police that he wanted help. Patient denied prior psych hospitalization, suicide attempts and self-harming behaviors. Daily notes:  Richard Osborn is seen, chart reviewed. The chart findings discussed with the treatment team. He presents alert, oriented x 4. He is visible on the unit, attending group sessions. He says he is doing great, just bored for being here. He says he was brought to the haopistal because he had too much to drink, got drunk, did not want to go to work drunk, called his boss. Says must have said something that made his boss think that he was suicidal. He says he is sad that he relapsed on alcohol, but plans to resume substance abuse treatment (IOP) at the [facility] after discharge. He currently denies any symptoms of depression or anxiety. He denies any SIHI, AVH, delusional thoughts or paranoia. He does not appear to be responding to any internal stimuli. No changes made on his current plan of care.  Principal Problem: Alcohol abuse with alcohol-induced mood disorder (HCC)  Diagnosis: Principal Problem:   Alcohol abuse with alcohol-induced mood disorder (HCC) Active Problems:   Substance induced mood disorder (HCC)  Total Time spent with patient: 25 minutes  Past Psychiatric History: See  H&P.  Past Medical History:  Past Medical History:  Diagnosis Date  . Depressed   . Sleep apnea     Past Surgical History:  Procedure Laterality Date  . arm surgery     . NASAL SEPTUM SURGERY    . ORIF ULNAR / RADIAL SHAFT FRACTURE     Family History:  Family History  Problem Relation Age of Onset  . Diabetes Other        grandmother   Family Psychiatric  History: See H&P  Social History:  Social History   Substance and Sexual Activity  Alcohol Use Yes  . Alcohol/week: 5.0 standard drinks  . Types: 5 Shots of liquor per week   Comment: daily      Social History   Substance and Sexual Activity  Drug Use No    Social History   Socioeconomic History  . Marital status: Divorced    Spouse name: Not on file  . Number of children: Y  . Years of education: Not on file  . Highest education level: GED or equivalent  Occupational History  . Occupation: bus driver    Comment: for [employer]  Tobacco Use  . Smoking status: Former Smoker    Packs/day: 1.00    Years: 20.00    Pack years: 20.00    Types: Cigarettes  . Smokeless tobacco: Never Used  Vaping Use  . Vaping Use: Never used  Substance and Sexual Activity  . Alcohol use: Yes    Alcohol/week: 5.0 standard drinks  Types: 5 Shots of liquor per week    Comment: daily   . Drug use: No  . Sexual activity: Not on file  Other Topics Concern  . Not on file  Social History Narrative   Pt is a past EMT   Social Determinants of Health   Financial Resource Strain: Not on file  Food Insecurity: Not on file  Transportation Needs: Not on file  Physical Activity: Not on file  Stress: Not on file  Social Connections: Not on file   Additional Social History:   Sleep: Fair  Appetite:  Good  Current Medications: Current Facility-Administered Medications  Medication Dose Route Frequency Provider Last Rate Last Admin  . acetaminophen (TYLENOL) tablet 650 mg  650 mg Oral Q6H PRN [name] A, NP   650 mg  at 09/17/20 0855  . alum & mag hydroxide-simeth (MAALOX/MYLANTA) 200-200-20 MG/5ML suspension 30 mL  30 mL Oral Q4H PRN [name] A, NP      . hydrOXYzine (ATARAX/VISTARIL) tablet 25 mg  25 mg Oral Q6H PRN [name] A, NP   25 mg at 09/16/20 2130  . loperamide (IMODIUM) capsule 2-4 mg  2-4 mg Oral PRN [name] A, NP      . LORazepam (ATIVAN) tablet 1 mg  1 mg Oral Q6H PRN [name] A, NP      . magnesium hydroxide (MILK OF MAGNESIA) suspension 30 mL  30 mL Oral Daily PRN [name] A, NP      . nicotine polacrilex (NICORETTE) gum 2 mg  2 mg Oral PRN Laveda Abbe, NP   2 mg at 09/17/20 0859  . ondansetron (ZOFRAN-ODT) disintegrating tablet 4 mg  4 mg Oral Q6H PRN [name] A, NP      . pantoprazole (PROTONIX) EC tablet 40 mg  40 mg Oral Daily PRN Laveda Abbe, NP      . thiamine tablet 100 mg  100 mg Oral Daily [name] A, NP   100 mg at 09/17/20 0853  . traZODone (DESYREL) tablet 50 mg  50 mg Oral QHS PRN Jackelyn Poling, NP   50 mg at 09/16/20 2130   Lab Results:  Results for orders placed or performed during the hospital encounter of 09/15/20 (from the past 48 hour(s))  Hemoglobin A1c     Status: None   Collection Time: 09/16/20  6:25 AM  Result Value Ref Range   Hgb A1c MFr Bld 4.9 4.8 - 5.6 %    Comment: (NOTE) Pre diabetes:          5.7%-6.4%  Diabetes:              >6.4%  Glycemic control for   <7.0% adults with diabetes    Mean Plasma Glucose 93.93 mg/dL    Comment: Performed at [facility], [address] [place], [place] [address]  Lipid panel     Status: Abnormal   Collection Time: 09/16/20  6:25 AM  Result Value Ref Range   Cholesterol 215 (H) 0 - 200 mg/dL   Triglycerides 85 <010 mg/dL   HDL 90 >27 mg/dL   Total CHOL/HDL Ratio 2.4 RATIO   VLDL 17 0 - 40 mg/dL   LDL Cholesterol 253 (H) 0 - 99 mg/dL    Comment:        Total Cholesterol/HDL:CHD Risk Coronary Heart Disease Risk Table  Men   Women  1/2  Average Risk   3.4   3.3  Average Risk       5.0   4.4  2 X Average Risk   9.6   7.1  3 X Average Risk  23.4   11.0        Use the calculated Patient Ratio above and the CHD Risk Table to determine the patient's CHD Risk.        ATP III CLASSIFICATION (LDL):  <100     mg/dL   Optimal  161-096100-129  mg/dL   Near or Above                    Optimal  130-159  mg/dL   Borderline  045-409160-189  mg/dL   High  >811>190     mg/dL   Very High Performed at [facility], [address] [place], [place] [address]   TSH     Status: Abnormal   Collection Time: 09/16/20  6:25 AM  Result Value Ref Range   TSH 5.811 (H) 0.350 - 4.500 uIU/mL    Comment: Performed by a 3rd Generation assay with a functional sensitivity of <=0.01 uIU/mL. Performed at [facility], [address] [place], [place] [address]   T4, free     Status: None   Collection Time: 09/16/20  6:25 AM  Result Value Ref Range   Free T4 0.75 0.61 - 1.12 ng/dL    Comment: (NOTE) Biotin ingestion may interfere with free T4 tests. If the results are inconsistent with the TSH level, previous test results, or the clinical presentation, then consider biotin interference. If needed, order repeat testing after stopping biotin. Performed at [facility], [address] [place], [place] [address]    Blood Alcohol level:  Lab Results  Component Value Date   ETH 350 (HH) 09/14/2020   ETH 138 (H) 11/22/2016   Metabolic Disorder Labs: Lab Results  Component Value Date   HGBA1C 4.9 09/16/2020   MPG 93.93 09/16/2020   No results found for: PROLACTIN Lab Results  Component Value Date   CHOL 215 (H) 09/16/2020   TRIG 85 09/16/2020   HDL 90 09/16/2020   CHOLHDL 2.4 09/16/2020   VLDL 17 09/16/2020   LDLCALC 108 (H) 09/16/2020   Physical Findings: AIMS:  , ,  ,  ,    CIWA:  CIWA-Ar Total: 1 COWS:     Musculoskeletal: Strength & Muscle Tone: within normal limits Gait & Station: normal Patient  leans: N/A  Psychiatric Specialty Exam:  Presentation  General Appearance: Appropriate for Environment; Casual; Fairly Groomed  Eye Contact:Good  Speech:Clear and Coherent; Normal Rate  Speech Volume:Normal  Handedness:Right  Mood and Affect  Mood:Euthymic  Affect:Appropriate; Congruent  Thought Process  Thought Processes:Coherent; Goal Directed; Linear  Descriptions of Associations:Intact  Orientation:Full (Time, Place and Person)  Thought Content:Logical  History of Schizophrenia/Schizoaffective disorder:No data recorded Duration of Psychotic Symptoms:No data recorded Hallucinations:Hallucinations: None  Ideas of Reference:None  Suicidal Thoughts:Suicidal Thoughts: No  Homicidal Thoughts:Homicidal Thoughts: No   Sensorium  Memory:Immediate Fair; Recent Fair; Remote Fair  Judgment:Fair  Insight:Fair  Executive Functions  Concentration:Good  Attention Span:Good  Recall:Good  Fund of Knowledge:Good  Language:Good  Psychomotor Activity  Psychomotor Activity:Psychomotor Activity: Normal  Assets  Assets:Communication Skills; Financial Resources/Insurance; Housing; Physical Health; Social Support; Transportation  Sleep  Sleep:Sleep: Fair  Physical Exam: Physical Exam Vitals and nursing note reviewed.  HENT:  Head: Normocephalic.     Nose: Nose normal.     Mouth/Throat:     Pharynx: Oropharynx is clear.  Eyes:     Pupils: Pupils are equal, round, and reactive to light.  Cardiovascular:     Rate and Rhythm: Normal rate.     Pulses: Normal pulses.  Pulmonary:     Effort: Pulmonary effort is normal.  Genitourinary:    Comments: Deferred Musculoskeletal:        General: Normal range of motion.     Cervical back: Normal range of motion.  Skin:    General: Skin is warm and dry.  Neurological:     General: No focal deficit present.     Mental Status: He is alert and oriented to person, place, and time. Mental status is at baseline.     Review of Systems  Constitutional: Negative.   HENT: Negative.   Eyes: Negative.   Respiratory: Negative.   Cardiovascular: Negative.   Gastrointestinal: Negative for abdominal pain, blood in stool, constipation, diarrhea, heartburn, melena, nausea and vomiting.  Genitourinary: Negative for dysuria.  Musculoskeletal: Negative for joint pain and myalgias.  Skin: Negative.   Neurological: Negative for dizziness, tingling, tremors, sensory change, speech change, focal weakness, seizures, loss of consciousness, weakness and headaches.  Endo/Heme/Allergies:       Allergies: NKDA  Psychiatric/Behavioral: Positive for substance abuse (Hx. alcoholism). Negative for depression, hallucinations, memory loss and suicidal ideas. The patient is not nervous/anxious and does not have insomnia.    Blood pressure 133/71, pulse 76, temperature (!) 97.5 F (36.4 C), temperature source Oral, resp. rate 18, height 5\' 9"  (1.753 m), weight 135.2 kg, SpO2 100 %. Body mass index is 44.01 kg/m.  Treatment Plan Summary: Daily contact with patient to assess and evaluate symptoms and progress in treatment and Medication management.  Continue inpatient hospitalization. Will continue today 09/17/2020 plan as below except where it is noted.   1. Anxiety/CIWA.  Continue Vistaril 25 mg po Q 6 hrs prn. Continue Lorazepam 1 mg po Q 6 hrs prn for CIWA >10.  Insomnia. Continue Trazodone 50 mg po Q hs prn.  Continue Protonix 40 mg po Q am. Continue Thiamine 100 mg po qd.  Other medications: Continue Protonix 40 mg po Q am. Mylanta 30 ml po Q 4 hrs prn for indigestion. Imodium 2-4 mg po prn for diarrhea. MOM 30 ml po daily prn for constipation. Nicorette gum 2 mg po prn for smoking cessation. Zofran 4 mg po Q 6 hrs prn for N/V.  Encourage group participation. Discharge disposition plan ongoing.  09/19/2020, NP, PMHNP, FNP-BC. 09/17/2020, 4:38 PM

## 2020-09-18 LAB — T3, FREE: T3, Free: 3.1 pg/mL (ref 2.0–4.4)

## 2020-09-18 MED ORDER — NICOTINE POLACRILEX 2 MG MT GUM: 2.0000 mg | CHEWING_GUM | OROMUCOSAL | 0 refills | Status: DC | PRN

## 2020-09-18 MED ORDER — HYDROXYZINE HCL 25 MG PO TABS: 25.0000 mg | ORAL_TABLET | Freq: Four times a day (QID) | ORAL | 0 refills | Status: DC | PRN

## 2020-09-18 MED ORDER — TRAZODONE HCL 50 MG PO TABS: 50.0000 mg | ORAL_TABLET | Freq: Every evening | ORAL | 0 refills | Status: DC | PRN

## 2020-09-18 NOTE — BHH Suicide Risk Assessment (Signed)
[facility] Discharge Suicide Risk Assessment   Principal Problem: Alcohol abuse with alcohol-induced mood disorder (HCC) Discharge Diagnoses: Principal Problem:   Alcohol abuse with alcohol-induced mood disorder (HCC) Active Problems:   Substance induced mood disorder (HCC)   Total Time spent with patient: 25 minutes  Musculoskeletal: Strength & Muscle Tone: within normal limits Gait & Station: normal Patient leans: N/A  Psychiatric Specialty Exam: Review of Systems  Constitutional: Negative for fever.  HENT: Negative for congestion and sore throat.   Eyes: Negative for discharge.  Respiratory: Negative for cough and shortness of breath.   Cardiovascular: Negative for chest pain.  Gastrointestinal: Negative for nausea and vomiting.  Genitourinary: Negative for difficulty urinating.  Musculoskeletal: Negative for arthralgias.  Neurological: Negative for dizziness, tremors and headaches.  Psychiatric/Behavioral: Negative for dysphoric mood, hallucinations, self-injury, sleep disturbance and suicidal ideas. The patient is not nervous/anxious.     Blood pressure 119/63, pulse 82, temperature (!) 97.5 F (36.4 C), temperature source Oral, resp. rate 16, height 5\' 9"  (1.753 m), weight 135.2 kg, SpO2 100 %.Body mass index is 44.01 kg/m.  General Appearance: Casual and Fairly Groomed  ::  Good  Speech:  Clear and Coherent and Normal Rate  Volume:  Normal  Mood:  Euthymic  Affect:  Appropriate  Thought Process:  Coherent and Goal Directed  Orientation:  Full (Time, Place, and Person)  Thought Content:  Logical  Suicidal Thoughts:  No  Homicidal Thoughts:  No  Memory:  Immediate;   Good Recent;   Good Remote;   Good  Judgement:  Good  Insight:  Good  Psychomotor Activity:  Normal  Concentration:  Good  Recall:  Good  Fund of Knowledge:Good  Language: Good  Akathisia:  No    AIMS (if indicated):     Assets:  Communication Skills Desire for Improvement Financial  Resources/Insurance Housing Physical Health Resilience Social Support Vocational/Educational  Sleep:  Number of Hours: 5.5  Cognition: WNL  ADL's:  Intact   Mental Status Per Nursing Assessment::   On Admission:  NA  Demographic Factors:  Male, Divorced or widowed and Caucasian  Loss Factors: NA  Historical Factors: Family history of mental illness or substance abuse and Impulsivity  Risk Reduction Factors:   Sense of responsibility to family, Employed, Living with another person, especially a relative, Positive social support and Positive therapeutic relationship  Continued Clinical Symptoms:  Alcohol/Substance Abuse/Dependencies Previous Psychiatric Diagnoses and Treatments  Cognitive Features That Contribute To Risk:  None    Suicide Risk:  Minimal: No identifiable suicidal ideation.  Patients presenting with no risk factors but with morbid ruminations; may be classified as minimal risk based on the severity of the depressive symptoms   Follow-up Information    [facility]. Go on 09/21/2020.   Specialty: Behavioral Health Why: You have an appointment for intensive outpatient therapy services on M,W,F at 6:30 pm, next appointment on 09/21/20 at 6:30 pm (this program includes medication management services).   Contact information: [address] [place] [phone]        [phone], PA-C. Call.   Specialty: Physician Assistant Why: Please contact this doctor to schedule a primary care follow-up.  Contact information: [address] [place] [phone]               Plan Of Care/Follow-up recommendations:  Activity:  as tolerated  Other:   - Take medications as prescribed.   - Continue outpatient treatment program for alcohol use disorder.   -  Keep outpatient therapy and other outpatient mental health appointments.   - See primary care provider for treatment of any medical problems and for any  physical issues.   - Do not drink alcohol.  Do not use marijuana or other drugs.  Claudie Revering, MD 09/18/2020, 12:35 PM

## 2020-09-18 NOTE — Progress Notes (Signed)
   09/18/20 0700  Sleep  Number of Hours 5.5

## 2020-09-18 NOTE — Progress Notes (Signed)
  [facility] Adult Case Management Discharge Plan :  Will you be returning to the same living situation after discharge:  Yes,  Home At discharge, do you have transportation home?: Yes,  Girlfriend Do you have the ability to pay for your medications: Yes,  Insurance   Release of information consent forms completed and in the chart;  Patient's signature needed at discharge.  Patient to Follow up at:  Follow-up Information    [facility]. Go on 09/21/2020.   Specialty: Behavioral Health Why: You have an appointment for intensive outpatient therapy services on M,W,F at 6:30 pm, next appointment on 09/21/20 at 6:30 pm (this program includes medication management services).   Contact information: [address] [place] [address] [phone]        Salli Quarry, PA-C. Call.   Specialty: Physician Assistant Why: Please contact this doctor to schedule a primary care follow-up.  Contact information: [address] [place] [address] [phone]               Next level of care provider has access to [facility] Link:no  Safety Planning and Suicide Prevention discussed: Yes,  with patient and girlfriend      Has patient been referred to the Quitline?: Patient refused referral  Patient has been referred for addiction treatment: Yes  Aram Beecham, LCSWA 09/18/2020, 12:57 PM

## 2020-09-18 NOTE — Progress Notes (Signed)
   09/18/20 0609  Vital Signs  Temp (!) 97.5 F (36.4 C)  Temp Source Oral  Pulse Rate 60  Pulse Rate Source Dinamap  Resp 16  BP 125/78  BP Location Right Arm  BP Method Automatic  Patient Position (if appropriate) Sitting  Oxygen Therapy  SpO2 99 %  O2 Device Room Air   D: Pt. Denies SI/HI/AVH. Patient denies anxiety and depression. Pt. Stated "I want to go home." Pt. Out in open areas and was social with peers and staff. Pt. Attended group. A:  Patient took scheduled medicine. Pt took nicorette for cravings.  Support and encouragement provided Routine safety checks conducted every 15 minutes. Patient  Informed to notify staff with any concerns.   R: Safety maintained.

## 2020-09-18 NOTE — [facility] Suicide Risk Assessment (Signed)
[facility] INPATIENT:  Family/Significant Other Suicide Prevention Education  Suicide Prevention Education:  Education Completed; Tish Frederickson [phone] (Girlfriend) has been identified by the patient as the family member/significant other with whom the patient will be residing, and identified as the person(s) who will aid the patient in the event of a mental health crisis (suicidal ideations/suicide attempt).  With written consent from the patient, the family member/significant other has been provided the following suicide prevention education, prior to the and/or following the discharge of the patient.  The suicide prevention education provided includes the following:  Suicide risk factors  Suicide prevention and interventions  National Suicide Hotline telephone number  [facility] assessment telephone number  [facility] Emergency Assistance 911  [facility] and/or Residential Mobile Crisis Unit telephone number  Request made of family/significant other to:  Remove weapons (e.g., guns, rifles, knives), all items previously/currently identified as safety concern.    Remove drugs/medications (over-the-counter, prescriptions, illicit drugs), all items previously/currently identified as a safety concern.  The family member/significant other verbalizes understanding of the suicide prevention education information provided.  The family member/significant other agrees to remove the items of safety concern listed above.  CSW spoke with Ms. Jenne Pane who states that she was not there last weekend when her boyfriend started drinking and called his employer.  Ms. Jenne Pane states that her  Boyfriend has not been depressed and has not mentioned suicidal thoughts to her in the past.  Ms. Jenne Pane confirms that the police took the firearm from the home.  She also confirms that her boyfriend has been medication compliant and has been attending treatment at the [facility] for alcohol use.  Ms.  Jenne Pane states that before his hospital admission her boyfriend had not consumed any alcohol in at least 2 weeks.  Ms. Jenne Pane states that she lives with her boyfriend and is there daily to help with relapse prevention.  Ms. Jenne Pane confirms that he can return to the home.  CSW completed SPE with Ms. Jenne Pane.   Metro Kung Bernedette Auston 09/18/2020, 10:02 AM

## 2020-09-18 NOTE — BHH Counselor (Signed)
Type of Therapy and Topic: Group Therapy: Anger Management   Participation:  Active  Description of Group: In this group, patients will learn helpful strategies and techniques to manage anger, express anger in alternative ways, change hostile attitudes, and prevent aggressive acts, such as verbal abuse and violence.This group will be process-oriented and eductional, with patients participating in exploration of their own experiences as well as giving and receiving support and challenge from other group members.  Therapeutic Goals: 1. Patient will learn to manage anger. 2. Patient will learn to stop violence or the threat of violence. 3. Patient will learn to develop self control over thoughts and actions. 4. Patient will receive support and feedback from others  Richard Osborn participated in group and accepted the worksheets that were provided.  Richard Osborn states that one thing he can control and manage is his anger.  Richard Osborn states that he does this by taking time to think and make a better choice.

## 2020-09-18 NOTE — Progress Notes (Signed)
Discharge Note:  Patient denies SI/HI AVH at this time. Discharge instructions, AVS, prescriptions and transition record gone over with patient. Patient agrees to comply with medication management, follow-up visit, and outpatient therapy. Patient belongings returned to patient. Patient questions and concerns addressed and answered.  Patient ambulatory off unit.  Patient discharged to home with friend.   

## 2020-09-18 NOTE — Progress Notes (Signed)
Recreation Therapy Notes  Date:  4.22.22 Time: 0930 Location: [address] Dayroom  Group Topic: Stress Management  Goal Area(s) Addresses:  Patient will identify positive stress management techniques. Patient will identify benefits of using stress management post d/c.  Intervention: Stress Management  Activity :  Progressive Muscle Relaxation.  LRT read a script that guided patients in tensing each muscle group individually and then relaxing it to release any tension they may have in the body.  Education:  Stress Management, Discharge Planning.   Education Outcome: Acknowledges Education  Clinical Observations/Feedback: Pt did not attend group session.    Kalmen Lollar, LRT/CTRS         Magie Ciampa A 09/18/2020 11:00 AM 

## 2020-09-18 NOTE — Discharge Summary (Signed)
Physician Discharge Summary Note  Patient:  Richard Osborn is an 45 y.o., male MRN:  [id number] DOB:  07-19-1975 Patient phone:  [phone] (home)  Patient address:   [address] [place] [address],  Total Time spent with patient: Greater than 30 minutes  Date of Admission:  09/15/2020  Date of Discharge: 09-18-20  Reason for Admission: Suicidal threats while intoxicated.  Principal Problem: Alcohol abuse with alcohol-induced mood disorder [facility])  Discharge Diagnoses: Principal Problem:   Alcohol abuse with alcohol-induced mood disorder (HCC) Active Problems:   Substance induced mood disorder (HCC)  Past Psychiatric History: Hx . alcoholism  Past Medical History:  Past Medical History:  Diagnosis Date  . Depressed   . Sleep apnea     Past Surgical History:  Procedure Laterality Date  . arm surgery     . NASAL SEPTUM SURGERY    . ORIF ULNAR / RADIAL SHAFT FRACTURE     Family History:  Family History  Problem Relation Age of Onset  . Diabetes Other        grandmother   Family Psychiatric  History: See H&P  Social History:  Social History   Substance and Sexual Activity  Alcohol Use Yes  . Alcohol/week: 5.0 standard drinks  . Types: 5 Shots of liquor per week   Comment: daily      Social History   Substance and Sexual Activity  Drug Use No    Social History   Socioeconomic History  . Marital status: Divorced    Spouse name: Not on file  . Number of children: Y  . Years of education: Not on file  . Highest education level: GED or equivalent  Occupational History  . Occupation: bus driver    Comment: for [employer]  Tobacco Use  . Smoking status: Former Smoker    Packs/day: 1.00    Years: 20.00    Pack years: 20.00    Types: Cigarettes  . Smokeless tobacco: Never Used  Vaping Use  . Vaping Use: Never used  Substance and Sexual Activity  . Alcohol use: Yes    Alcohol/week: 5.0 standard drinks    Types: 5 Shots of liquor per week     Comment: daily   . Drug use: No  . Sexual activity: Not on file  Other Topics Concern  . Not on file  Social History Narrative   Pt is a past EMT   Social Determinants of Health   Financial Resource Strain: Not on file  Food Insecurity: Not on file  Transportation Needs: Not on file  Physical Activity: Not on file  Stress: Not on file  Social Connections: Not on file   Hospital Course: (See Provider's admission evaluation notes): Richard Osborn is a 45 year old male [facility]. Patient stated, "I called out of work and unsure what I said, my boss thought I sounded suicidal so the police came out to the house". Patient vaguely remembered stating "I was having a bad day, I messed up for drinking, I don't want to be here". Patient reported the police took his gun when they came out to the home. Patient was holding gun in hand when police arrived to his home. Patient reported to police that he wanted help. Patient denied prior psych hospitalization, suicide attempts and self-harming behaviors. Patient denied current SI. Patient denied HI and psychosis. Patient is currently being seen at [facility] for alcohol detox treatment. Patient reported drinking 1  quart of beer last night. Patient resides with girlfriend. Patient has a 100 year old child lives with child's mother. Patient reported discord with girlfriend. Patient is currently employed and denied work-related stressors, stating "I love my job". Patient reported no other guns in the home after police took his gun.  Prior to this discharge, Sudeep was seen & evaluated for mental health stability/alcohol withdrawal symptoms. The current laboratory findings were reviewed (stable), nurses notes & vital signs were reviewed as well. There are no current mental health or medical issues that should prevent this discharge at this time. Patient is being discharged to continue mental health issues as noted  below.  Although with what seem like an extensive hx of possible chronic alcohol use disorder, this is Richard Osborn's first psychiatric admission/discharge summary from this [facility] per chart review. He was admitted with complaint of recent relapse on alcohol triggering mood instability that led to suicidal threats. Patient apparent called his boss while & drunk & a concerning statements that led to his boss calling the cops for patient's safety. He was brought to hospital for evaluation & treatment.   After the above admission evaluation, Richard Osborn was recommended for mood stabilization treatments by his treatment team. And with his consent, he received, stabilized & was discharged on the medications as listed below on his discharge medication lists. He was also enrolled & participated in the group counseling sessions being offered & held on this unit. He learned coping skills. However, although monitored closed for any signs of alcohol withdrawal symptoms, Richard Osborn never did develop any serious withdrawal symptoms that required management. He presented no other significant pre-existing medical conditions that required treatment or monitoring. He tolerated his treatment regimen without any adverse effects or reactions reported.   Richard Osborn's symptoms responded well to his treatment regimen warranting this discharge. This is evidenced by his gradual reports of improved symptoms on daily basis up to this discharge date. He also presented on daily basis an improved affect & eye contacts During the course of his hospitalization, the 15-minute checks were adequate to ensure his safety. Patient did not display any dangerous, violent or suicidal behavior on the unit. He interacted with patients & staff appropriately. He participated appropriately in the group sessions/therapies. His medications were addressed & adjusted to meet his needs. He was recommended for outpatient follow-up care & medication management upon discharge to  assure his continuity of care.  At the time of discharge, patient is not reporting any acute suicidal/homicidal ideations. There are no signs of alcohol withdrawal symptoms.  He currently denies any new issues or concerns. Education and supportive counseling provided throughout his hospital stay & upon discharge.   Today upon his discharge evaluation with the attending psychiatrist, Danielshares he is doing well. He denies any other specific concerns. He is sleeping well. His appetite is improved. He denies other physical complaints. He denies AH/VH, delusional thoughts or paranoia. He was able to engage in safety planning including plan to return to [facility] or contact emergency services if he feels unable to maintain his own safety or the safety of others. Pt had no further questions, comments, or concerns. He left [facility] with all personal belongings in no apparent distress. He will resume substance abuse counseling at the [facility] here in [place], [place]. Transportation per his girlfriend.  Physical Findings: AIMS:  , ,  ,  ,    CIWA:  CIWA-Ar Total: 0 COWS:     Musculoskeletal: Strength & Muscle Tone: within normal limits Gait &  Station: normal Patient leans: N/A  Psychiatric Specialty Exam:  Presentation  General Appearance: Appropriate for Environment; Casual; Fairly Groomed  Eye Contact:Good  Speech:Clear and Coherent; Normal Rate  Speech Volume:Normal  Handedness:Right   Mood and Affect  Mood:Euthymic  Affect:Appropriate; Congruent   Thought Process  Thought Processes:Coherent; Goal Directed; Linear  Descriptions of Associations:Intact  Orientation:Full (Time, Place and Person)  Thought Content:Logical  History of Schizophrenia/Schizoaffective disorder:No data recorded Duration of Psychotic Symptoms:No  Hallucinations:No Ideas of Reference:None  Suicidal Thoughts: Denies Homicidal Thoughts: Denies  Sensorium  Memory:Immediate Fair; Recent Fair; Remote  Fair  Judgment:Fair  Insight:Fair   Executive Functions  Concentration:Good  Attention Span:Good  Recall:Good  Fund of Knowledge:Good  Language:Good  Psychomotor Activity  Psychomotor Activity:Normal  Assets  Assets:Communication Skills; Financial Resources/Insurance; Housing; Physical Health; Social Support; Transportation  Sleep  Sleep:No data recorded  Physical Exam: Physical Exam Vitals and nursing note reviewed.  HENT:     Head: Normocephalic.     Nose: Nose normal.     Mouth/Throat:     Pharynx: Oropharynx is clear.  Eyes:     Pupils: Pupils are equal, round, and reactive to light.  Cardiovascular:     Rate and Rhythm: Normal rate.  Pulmonary:     Effort: Pulmonary effort is normal.  Genitourinary:    Comments: Deferred Musculoskeletal:        General: Normal range of motion.     Cervical back: Normal range of motion.  Skin:    General: Skin is warm and dry.  Neurological:     General: No focal deficit present.     Mental Status: He is alert and oriented to person, place, and time. Mental status is at baseline.    Review of Systems  Constitutional: Negative.   HENT: Negative.   Eyes: Negative.   Respiratory: Negative.   Cardiovascular: Negative.   Gastrointestinal: Negative.   Genitourinary: Negative.   Musculoskeletal: Negative.   Skin: Negative.   Neurological: Negative.   Endo/Heme/Allergies: Negative.   Psychiatric/Behavioral: Negative for depression, hallucinations, memory loss, substance abuse (Hx. alcohol use disorder) and suicidal ideas. The patient has insomnia (Hx. of (stabilized with emdication upon discharge)). The patient is not nervous/anxious.    Blood pressure 119/63, pulse 82, temperature (!) 97.5 F (36.4 C), temperature source Oral, resp. rate 16, height 5\' 9"  (1.753 m), weight 135.2 kg, SpO2 100 %. Body mass index is 44.01 kg/m.  Has this patient used any form of tobacco in the last 30 days? (Cigarettes, Smokeless  Tobacco, Cigars, and/or Pipes): Yes, an FDA-approved tobacco cessation medication was offered at discharge.  Blood Alcohol level:  Lab Results  Component Value Date   ETH 350 (HH) 09/14/2020   ETH 138 (H) 11/22/2016   Metabolic Disorder Labs:  Lab Results  Component Value Date   HGBA1C 4.9 09/16/2020   MPG 93.93 09/16/2020   No results found for: PROLACTIN Lab Results  Component Value Date   CHOL 215 (H) 09/16/2020   TRIG 85 09/16/2020   HDL 90 09/16/2020   CHOLHDL 2.4 09/16/2020   VLDL 17 09/16/2020   LDLCALC 108 (H) 09/16/2020    See Psychiatric Specialty Exam and Suicide Risk Assessment completed by Attending Physician prior to discharge.  Discharge destination:  Home  Is patient on multiple antipsychotic therapies at discharge:  No   Has Patient had three or more failed trials of antipsychotic monotherapy by history:  No  Recommended Plan for Multiple Antipsychotic Therapies: NA  Discharge Instructions  Diet - low sodium heart healthy   Complete by: As directed      Allergies as of 09/18/2020   No Known Allergies     Medication List    STOP taking these medications   apixaban 5 MG Tabs tablet Commonly known as: ELIQUIS   chlordiazePOXIDE 10 MG capsule Commonly known as: LIBRIUM   naproxen sodium 220 MG tablet Commonly known as: ALEVE   testosterone cypionate 200 MG/ML injection Commonly known as: DEPOTESTOSTERONE CYPIONATE   varenicline 1 MG tablet Commonly known as: CHANTIX     TAKE these medications     Indication  hydrOXYzine 25 MG tablet Commonly known as: ATARAX/VISTARIL Take 1 tablet (25 mg total) by mouth every 6 (six) hours as needed. For anxiety  Indication: Feeling Anxious   nicotine polacrilex 2 MG gum Commonly known as: NICORETTE Take 1 each (2 mg total) by mouth as needed. (May buy from over the counter): For smoking cessation  Indication: Nicotine Addiction   traZODone 50 MG tablet Commonly known as: DESYREL Take 1 tablet  (50 mg total) by mouth at bedtime as needed for sleep.  Indication: Trouble Sleeping       Follow-up [facility]. Go on 09/21/2020.   Specialty: Behavioral Health Why: You have an appointment for intensive outpatient therapy services on M,W,F at 6:30 pm, next appointment on 09/21/20 at 6:30 pm (this program includes medication management services).   Contact information: [address] [place] [address] [phone]        Salli QuarryZimmermann, Matt, PA-C. Call.   Specialty: Physician Assistant Why: Please contact this doctor to schedule a primary care follow-up.  Contact information: [address] [place] [id number] [phone]              Follow-up recommendations: Activity:  As tolerated Diet: As recommended by your primary care doctor. Keep all scheduled follow-up appointments as recommended.   Comments: Prescriptions given at discharge.  Patient agreeable to plan.  Given opportunity to ask questions.  Appears to feel comfortable with discharge denies any current suicidal or homicidal thought. Patient is also instructed prior to discharge to: Take all medications as prescribed by his/her mental healthcare provider. Report any adverse effects and or reactions from the medicines to his/her outpatient provider promptly. Patient has been instructed & cautioned: To not engage in alcohol and or illegal drug use while on prescription medicines. In the event of worsening symptoms, patient is instructed to call the crisis hotline, 911 and or go to the nearest ED for appropriate evaluation and treatment of symptoms. To follow-up with his/her primary care provider for your other medical issues, concerns and or health care needs.  Signed: Armandina StammerAgnes Joden Bonsall, NP, PMHNP, FNP-BC 09/18/2020, 2:29 PM

## 2020-12-21 ENCOUNTER — Telehealth: Payer: Self-pay | Admitting: Pulmonary Disease

## 2020-12-21 NOTE — Telephone Encounter (Signed)
Called and spoke with patient. 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 I advised him that I would leave 2 copies at the front for him. He verbalized understanding. We will come by the office to pick them up.   Nothing further needed at time of call.

## 2021-04-05 ENCOUNTER — Emergency Department (HOSPITAL_COMMUNITY)
Admission: EM | Admit: 2021-04-05 | Discharge: 2021-04-05 | Disposition: A | Discharge status: Home / Self Care | Payer: BC Managed Care – PPO | Attending: Emergency Medicine | Admitting: Emergency Medicine

## 2021-04-05 DIAGNOSIS — Z87891 Personal history of nicotine dependence: Secondary | ICD-10-CM | POA: Insufficient documentation

## 2021-04-05 DIAGNOSIS — N182 Chronic kidney disease, stage 2 (mild): Secondary | ICD-10-CM | POA: Insufficient documentation

## 2021-04-05 DIAGNOSIS — H6121 Impacted cerumen, right ear: Secondary | ICD-10-CM

## 2021-04-05 MED ORDER — DOCUSATE SODIUM 100 MG PO CAPS
200.0000 mg | ORAL_CAPSULE | Freq: Once | ORAL | Status: AC
  Administered 2021-04-05: 200 mg via ORAL
  Filled 2021-04-05: qty 2

## 2021-04-05 MED ORDER — CARBAMIDE PEROXIDE 6.5 % OT SOLN
5.0000 [drp] | Freq: Once | OTIC | Status: AC
  Administered 2021-04-05: 5 [drp] via OTIC
  Filled 2021-04-05 (×2): qty 15

## 2021-04-05 NOTE — ED Triage Notes (Signed)
Pt states his right ear feels like it is stopped up for the past 5 days.

## 2021-04-05 NOTE — ED Provider Notes (Signed)
Care of patient assumed from Dr. Anitra Lauth at 4:30 PM.  This patient has a history of cerumen impaction.  He has had sensation of fullness and diminished hearing in his right ear.  Plan is for Debrox otic drops, saline irrigation and discharge. Physical Exam  BP (!) 156/101   Pulse 77   Temp 98.3 F (36.8 C) (Oral)   Resp 16   Ht 5\' 9"  (1.753 m)   Wt 133.8 kg   SpO2 98%   BMI 43.56 kg/m   Physical Exam Constitutional:      General: He is not in acute distress.    Appearance: Normal appearance. He is not ill-appearing, toxic-appearing or diaphoretic.  HENT:     Head: Normocephalic and atraumatic.     Right Ear: Ear canal and external ear normal. There is impacted cerumen.     Left Ear: Tympanic membrane, ear canal and external ear normal.     Nose: Nose normal.  Neurological:     Mental Status: He is alert.  Psychiatric:        Mood and Affect: Mood normal.        Behavior: Behavior normal.        Thought Content: Thought content normal.        Judgment: Judgment normal.    ED Course/Procedures     Procedures  MDM  On assessment, patient resting comfortably.  He did not experience any resolution following Debrox drops and irrigation.  Colace was ordered, to use topically in his ear for cerumen dissolving.  This also was unable to fully dissolve the cerumen.  Patient to continue peroxide otic drops twice daily and to follow-up with ENT as needed.  He was discharged in stable condition.       , MD 04/06/21 0140

## 2021-04-05 NOTE — ED Provider Notes (Signed)
Emergency Medicine Provider Triage Evaluation Note  Richard Osborn , a 45 y.o. male  was evaluated in triage.  Pt complains of right ear cerumen impaction. States that he has had one week of decreased hearing and tinnitus. States that he has history of cerumen impaction and feels the same. States he tried "drops" at home but has not been able to improve symptoms. No fever or pain. Denies recent URI.   Review of Systems  Positive: Decreased hearing, tinnitus Negative: Fever, cough, congestion   Physical Exam  BP (!) 164/99 (BP Location: Left Arm)   Pulse 78   Temp 98.3 F (36.8 C) (Oral)   Resp 18   SpO2 98%  Gen:   Awake, no distress   Resp:  Normal effort  MSK:   Moves extremities without difficulty  Other:  Right ear with impacted cerumen. Unable to visualize TM. External canal without erythema or swelling. Mastoid without swelling or erythema. No TTP. Left ear with cerumen in the external canal without impaction. TM intact with good cone of light. Mastoid without erythema or swelling.   Medical Decision Making  Medically screening exam initiated at 2:11 PM.  Appropriate orders placed.  Richard Osborn was informed that the remainder of the evaluation will be completed by another provider, this initial triage assessment does not replace that evaluation, and the importance of remaining in the ED until their evaluation is complete.     Cristopher Peru, PA-C 04/05/21 1413    Gwyneth Sprout, MD 04/05/21 1431

## 2021-04-05 NOTE — ED Provider Notes (Signed)
[facility] DEPT Provider Note   CSN: [id number] Arrival date & time: 04/05/21  1251     History Chief Complaint  Patient presents with   Ear Fullness    Richard Osborn is a 45 y.o. male.  The history is provided by the patient.  Ear Fullness This is a new problem. Episode onset: 4 days ago. The problem occurs constantly. The problem has not changed since onset.Associated symptoms comments: Ringing in the ear and decreased hearing.  That has happened before and he had to have his ear flushed.  He has been trying drops at home but nothing is coming out and it was starting to get more irritated and painful.  No fever, dizziness or nasal congestion... Nothing aggravates the symptoms. Nothing relieves the symptoms. The treatment provided no relief.      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   Morbid obesity due to excess calories ([place]) 06/26/2015   Sepsis ([place]) 06/26/2015   Cellulitis 06/24/2015   Cellulitis of right leg 06/24/2015   Acute kidney injury ([place]) 06/24/2015   Leucocytosis 06/24/2015   OSA (obstructive sleep apnea) 09/23/2014    Past Surgical History:  Procedure Laterality Date   arm surgery      NASAL SEPTUM SURGERY     ORIF ULNAR / RADIAL SHAFT FRACTURE         Family History  Problem Relation Age of Onset   Diabetes Other        grandmother    Social History   Tobacco Use   Smoking status: Former    Packs/day: 1.00    Years: 20.00    Pack years: 20.00    Types: Cigarettes   Smokeless tobacco: Never  Vaping Use    Vaping Use: Never used  Substance Use Topics   Alcohol use: Yes    Alcohol/week: 5.0 standard drinks    Types: 5 Shots of liquor per week    Comment: daily    Drug use: No    Home Medications Prior to Admission medications   Medication Sig Start Date End Date Taking? Authorizing Provider  hydrOXYzine (ATARAX/VISTARIL) 25 MG tablet Take 1 tablet (25 mg total) by mouth every 6 (six) hours as needed. For anxiety 09/18/20   Lindell Spar I, NP  nicotine polacrilex (NICORETTE) 2 MG gum Take 1 each (2 mg total) by mouth as needed. (May buy from over the counter): For smoking cessation 09/18/20   Lindell Spar I, NP  traZODone (DESYREL) 50 MG tablet Take 1 tablet (50 mg total) by mouth at bedtime as needed for sleep. 09/18/20   Encarnacion Slates, NP    Allergies    Patient has no known allergies.  Review of Systems   Review of Systems  All other systems reviewed and are negative.  Physical Exam Updated Vital Signs BP (!) 164/99 (BP Location: Left Arm)   Pulse 78   Temp 98.3 F (36.8 C) (Oral)   Resp 18   Ht 5\' 9"  (1.753 m)   Wt 133.8 kg   SpO2 98%  BMI 43.56 kg/m   Physical Exam Vitals and nursing note reviewed.  Constitutional:      Appearance: Normal appearance.  HENT:     Right Ear: There is impacted cerumen.     Left Ear: Tympanic membrane and ear canal normal.  Eyes:     Pupils: Pupils are equal, round, and reactive to light.  Cardiovascular:     Rate and Rhythm: Normal rate.  Pulmonary:     Effort: Pulmonary effort is normal.  Skin:    General: Skin is warm and dry.  Neurological:     Mental Status: He is alert. Mental status is at baseline.  Psychiatric:        Mood and Affect: Mood normal.        Behavior: Behavior normal.    ED Results / Procedures / Treatments   Labs (all labs ordered are listed, but only abnormal results are displayed) Labs Reviewed - No data to display  EKG None  Radiology No results found.  Procedures Procedures   Medications  Ordered in ED Medications  carbamide peroxide (DEBROX) 6.5 % OTIC (EAR) solution 5 drop (has no administration in time range)    ED Course  I have reviewed the triage vital signs and the nursing notes.  Pertinent labs & imaging results that were available during my care of the patient were reviewed by me and considered in my medical decision making (see chart for details).    MDM Rules/Calculators/A&P                           Patient presenting today with right cerumen impaction.  No complaints of infectious symptoms. Final Clinical Impression(s) / ED Diagnoses Final diagnoses:  None    Rx / DC Orders ED Discharge Orders     None        Gwyneth Sprout, MD 04/06/21 1315

## 2021-08-04 ENCOUNTER — Encounter (HOSPITAL_COMMUNITY): Payer: Self-pay

## 2021-08-04 ENCOUNTER — Other Ambulatory Visit: Payer: Self-pay

## 2021-08-04 ENCOUNTER — Emergency Department (HOSPITAL_COMMUNITY)
Admission: EM | Admit: 2021-08-04 | Discharge: 2021-08-05 | Disposition: A | Discharge status: Home / Self Care | Payer: Self-pay | Attending: Emergency Medicine | Admitting: Emergency Medicine

## 2021-08-04 DIAGNOSIS — F10230 Alcohol dependence with withdrawal, uncomplicated: Secondary | ICD-10-CM | POA: Insufficient documentation

## 2021-08-04 DIAGNOSIS — I10 Essential (primary) hypertension: Secondary | ICD-10-CM | POA: Insufficient documentation

## 2021-08-04 DIAGNOSIS — F10930 Alcohol use, unspecified with withdrawal, uncomplicated: Secondary | ICD-10-CM

## 2021-08-04 DIAGNOSIS — Y901 Blood alcohol level of 20-39 mg/100 ml: Secondary | ICD-10-CM | POA: Insufficient documentation

## 2021-08-04 LAB — COMPREHENSIVE METABOLIC PANEL
ALT: 20 U/L (ref 0–44)
AST: 25 U/L (ref 15–41)
Albumin: 3.5 g/dL (ref 3.5–5.0)
Alkaline Phosphatase: 69 U/L (ref 38–126)
Anion gap: 12 (ref 5–15)
BUN: 19 mg/dL (ref 6–20)
CO2: 22 mmol/L (ref 22–32)
Calcium: 8.5 mg/dL — ABNORMAL LOW (ref 8.9–10.3)
Chloride: 105 mmol/L (ref 98–111)
Creatinine, Ser: 0.96 mg/dL (ref 0.61–1.24)
GFR, Estimated: >60 mL/min (ref >60)
Glucose, Bld: 105 mg/dL — ABNORMAL HIGH (ref 70–99)
Potassium: 3.7 mmol/L (ref 3.5–5.1)
Sodium: 139 mmol/L (ref 135–145)
Total Bilirubin: 1.3 mg/dL — ABNORMAL HIGH (ref 0.3–1.2)
Total Protein: 7.7 g/dL (ref 6.5–8.1)

## 2021-08-04 LAB — CBC WITH DIFFERENTIAL/PLATELET
Abs Immature Granulocytes: 0.03 10*3/uL (ref 0.00–0.07)
Basophils Absolute: 0.1 10*3/uL (ref 0.0–0.1)
Basophils Relative: 1 %
Eosinophils Absolute: 0 10*3/uL (ref 0.0–0.5)
Eosinophils Relative: 0 %
HCT: 48.4 % (ref 39.0–52.0)
Hemoglobin: 16.9 g/dL (ref 13.0–17.0)
Immature Granulocytes: 0 %
Lymphocytes Relative: 18 %
Lymphs Abs: 1.4 10*3/uL (ref 0.7–4.0)
MCH: 34.3 pg — ABNORMAL HIGH (ref 26.0–34.0)
MCHC: 34.9 g/dL (ref 30.0–36.0)
MCV: 98.4 fL (ref 80.0–100.0)
Monocytes Absolute: 0.7 10*3/uL (ref 0.1–1.0)
Monocytes Relative: 9 %
Neutro Abs: 5.2 10*3/uL (ref 1.7–7.7)
Neutrophils Relative %: 72 %
Platelets: 205 10*3/uL (ref 150–400)
RBC: 4.92 MIL/uL (ref 4.22–5.81)
RDW: 14.6 % (ref 11.5–15.5)
WBC: 7.4 10*3/uL (ref 4.0–10.5)
nRBC: 0 % (ref 0.0–0.2)

## 2021-08-04 LAB — SALICYLATE LEVEL: Salicylate Lvl: <7 mg/dL — ABNORMAL LOW (ref 7.0–30.0)

## 2021-08-04 LAB — ETHANOL: Alcohol, Ethyl (B): 32 mg/dL — ABNORMAL HIGH (ref <10)

## 2021-08-04 MED ORDER — LORAZEPAM 2 MG/ML IJ SOLN
1.0000 mg | Freq: Once | INTRAMUSCULAR | Status: AC
  Administered 2021-08-04: 1 mg via INTRAVENOUS
  Filled 2021-08-04: qty 1

## 2021-08-04 MED ORDER — SODIUM CHLORIDE 0.9 % IV BOLUS
1000.0000 mL | Freq: Once | INTRAVENOUS | Status: AC
  Administered 2021-08-04: 1000 mL via INTRAVENOUS

## 2021-08-04 MED ORDER — ONDANSETRON HCL 4 MG/2ML IJ SOLN
4.0000 mg | Freq: Once | INTRAMUSCULAR | Status: AC
  Administered 2021-08-04: 4 mg via INTRAVENOUS
  Filled 2021-08-04: qty 2

## 2021-08-04 NOTE — ED Provider Triage Note (Signed)
Emergency Medicine Provider Triage Evaluation Note ? ?[name] , a 46 y.o. male  was evaluated in triage.  Pt complains of weight withdrawal.  Last drink yesterday evening.  History of alcohol withdrawal.  Has needed hospitalization previously however denies any withdrawal seizures.  States typically drinks fifth daily as well as 4 Loco's.  He feels anxious, shaky. ? ?Review of Systems  ?Positive: EtOH withdrawal ?Negative:  ? ?Physical Exam  ?BP (!) 198/116 (BP Location: Left Arm)   Pulse 97   Temp 98.5 ?F (36.9 ?C)   Resp 18   Ht 5\' 9"  (1.753 m)   Wt 133.8 kg   SpO2 95%   BMI 43.56 kg/m?  ?Gen:   Awake, no distress   ?Resp:  Normal effort  ?MSK:   Moves extremities without difficulty  ?Neuro:  Minimal tremors Bl ?Other:   ? ?Medical Decision Making  ?Medically screening exam initiated at 8:54 PM.  Appropriate orders placed.  was informed that the remainder of the evaluation will be completed by another provider, this initial triage assessment does not replace that evaluation, and the importance of remaining in the ED until their evaluation is complete. ? ?Alcohol withdrawl ?  ?Jayro Mcmath A, PA-C ?08/04/21 2055 ? ?

## 2021-08-04 NOTE — ED Triage Notes (Signed)
Pt reports with alcohol withdrawal. Pt states that his last drink was last night. Pt reports having tremors, minimal visible tremors, and anxiety.  ?

## 2021-08-05 MED ORDER — CHLORDIAZEPOXIDE HCL 25 MG PO CAPS: ORAL_CAPSULE | ORAL | 0 refills | Status: DC

## 2021-08-05 MED ORDER — LORAZEPAM 2 MG/ML IJ SOLN
1.0000 mg | Freq: Once | INTRAMUSCULAR | Status: AC
  Administered 2021-08-05: 01:00:00 1 mg via INTRAVENOUS
  Filled 2021-08-05: qty 1

## 2021-08-05 NOTE — ED Provider Notes (Signed)
?[facility]-EMERGENCY DEPT ?Provider Note ? ? ?CSN: [id number] ?Arrival date & time: 08/04/21  1953 ? ?  ? ?History ? ?Chief Complaint  ?Patient presents with  ? Alcohol Problem  ? ? ?Richard Osborn is a 46 y.o. male. ? ?Patient presents to the emergency department today for evaluation of alcohol withdrawal.  Patient has a history of alcohol use.  He has been in treatment in the past, most recently was clean for 4 months before relapsing.  States that over the past several days he has been drinking very heavily.  Last drink was on the evening of 08/07/2021.  States that he had a court date for recent DUI earlier today.  He endorses tremors, nausea without vomiting, anxiety, palpitations and high blood pressures.  No fevers or chills.  No chest pain or shortness of breath.  He has not had any seizures and does not have a history of seizures after stopping drinking.  Denies hallucinations.  He is concerned that he will have complications from the withdrawal.  Also is requesting assistance with substance abuse referrals. ? ? ?  ? ?Home Medications ?Prior to Admission medications   ?Medication Sig Start Date End Date Taking? Authorizing Provider  ?chlordiazePOXIDE (LIBRIUM) 25 MG capsule 50mg  PO TID x 1D, then 25-50mg  PO BID X 1D, then 25-50mg  PO QD X 1D 08/05/21  Yes 10/05/21, PA-C  ?hydrOXYzine (ATARAX/VISTARIL) 25 MG tablet Take 1 tablet (25 mg total) by mouth every 6 (six) hours as needed. For anxiety 09/18/20   09/20/20 I, NP  ?nicotine polacrilex (NICORETTE) 2 MG gum Take 1 each (2 mg total) by mouth as needed. (May buy from over the counter): For smoking cessation 09/18/20   09/20/20 I, NP  ?traZODone (DESYREL) 50 MG tablet Take 1 tablet (50 mg total) by mouth at bedtime as needed for sleep. 09/18/20   09/20/20, NP  ?   ? ?Allergies    ?Patient has no known allergies.   ? ?Review of Systems   ?Review of Systems ? ?Physical Exam ?Updated Vital Signs ?BP (!) 166/98   Pulse 86    Temp 98.5 ?F (36.9 ?C)   Resp 16   Ht 5\' 9"  (1.753 m)   Wt 133.8 kg   SpO2 97%   BMI 43.56 kg/m?  ?Physical Exam ?Vitals and nursing note reviewed.  ?Constitutional:   ?   General: He is not in acute distress. ?   Appearance: He is well-developed.  ?HENT:  ?   Head: Normocephalic and atraumatic.  ?   Right Ear: External ear normal.  ?   Left Ear: External ear normal.  ?   Nose: Nose normal.  ?   Mouth/Throat:  ?   Mouth: Mucous membranes are moist.  ?Eyes:  ?   General:     ?   Right eye: No discharge.     ?   Left eye: No discharge.  ?   Conjunctiva/sclera: Conjunctivae normal.  ?Cardiovascular:  ?   Rate and Rhythm: Normal rate and regular rhythm.  ?   Heart sounds: Normal heart sounds.  ?Pulmonary:  ?   Effort: Pulmonary effort is normal.  ?   Breath sounds: Normal breath sounds.  ?Abdominal:  ?   Palpations: Abdomen is soft.  ?   Tenderness: There is no abdominal tenderness.  ?Musculoskeletal:  ?   Cervical back: Normal range of motion and neck supple.  ?Skin: ?   General: Skin is warm  and dry.  ?Neurological:  ?   General: No focal deficit present.  ?   Mental Status: He is alert.  ? ? ?ED Results / Procedures / Treatments   ?Labs ?(all labs ordered are listed, but only abnormal results are displayed) ?Labs Reviewed  ?CBC WITH DIFFERENTIAL/PLATELET - Abnormal; Notable for the following components:  ?    Result Value  ? MCH 34.3 (*)   ? All other components within normal limits  ?COMPREHENSIVE METABOLIC PANEL - Abnormal; Notable for the following components:  ? Glucose, Bld 105 (*)   ? Calcium 8.5 (*)   ? Total Bilirubin 1.3 (*)   ? All other components within normal limits  ?ETHANOL - Abnormal; Notable for the following components:  ? Alcohol, Ethyl (B) 32 (*)   ? All other components within normal limits  ?SALICYLATE LEVEL - Abnormal; Notable for the following components:  ? Salicylate Lvl <7.0 (*)   ? All other components within normal limits  ? ? ?ED ECG REPORT ? ? Date: 08/05/2021 ? Rate: 87 ? Rhythm:  normal sinus rhythm ? QRS Axis: normal ? Intervals: normal ? ST/T Wave abnormalities: normal ? Conduction Disutrbances:none ? Narrative Interpretation:  ? Old EKG Reviewed: unchanged from 08/2020 ? ?I have personally reviewed the EKG tracing and agree with the computerized printout as noted. ? ? ?Radiology ?No results found. ? ?Procedures ?Procedures  ? ? ?Medications Ordered in ED ?Medications  ?sodium chloride 0.9 % bolus 1,000 mL (1,000 mLs Intravenous New Bag/Given 08/04/21 2311)  ?LORazepam (ATIVAN) injection 1 mg (1 mg Intravenous Given 08/04/21 2312)  ?ondansetron [facility](ZOFRAN) injection 4 mg (4 mg Intravenous Given 08/04/21 2313)  ?LORazepam (ATIVAN) injection 1 mg (1 mg Intravenous Given 08/05/21 0032)  ? ? ?ED Course/ Medical Decision Making/ A&P ?  ? ?Patient seen and examined. History obtained directly from patient. Work-up including labs, imaging, EKG ordered in triage, if performed, were reviewed.   ? ?Labs/EKG: Independently reviewed and interpreted.  This included: CBC unremarkable; CMP with normal LFTs, minimally elevated T. bili, otherwise unremarkable; alcohol 32.  ? ?EKG reviewed as above, no arrhythmias. ? ?Imaging: None ordered ? ?Medications/Fluids: Ordered: IV Ativan, Zofran  ? ?Vital signs reviewed and are as follows: ?BP (!) 166/98 (BP Location: Right Arm)   Pulse 86   Temp 98.5 ?F (36.9 ?C)   Resp 18   Ht 5\' 9"  (1.753 m)   Wt 133.8 kg   SpO2 97%   BMI 43.56 kg/m?  ? ?Initial impression: Mild alcohol withdrawal ? ? ? ?Reassessment performed. Patient appears comfortable, stable. ? ?Reviewed pertinent lab work and imaging with patient at bedside. Questions answered.  ? ?Plan: Additional dose of IV Ativan, discharged home with Librium taper. ? ? ? ?1:33 AM Reassessment performed. Patient appears stable. ? ?Confirmed patient's pharmacy. ? ?Plan: Discharge to home.  ? ?Prescriptions written for: Librium taper ? ?Other home care instructions discussed: Avoidance of alcohol, follow-up with substance  abuse referrals ? ?ED return instructions discussed: Uncontrolled symptoms, seizures, hallucinations ? ?Follow-up instructions discussed: Patient encouraged to follow-up with their PCP in 3 days.  ? ? ?                        ?Medical Decision Making ?Risk ?Prescription drug management. ? ? ?Patient presents for evaluation of alcohol withdrawal.  On arrival, symptoms seem relatively minor.  CIWA score of 6.  Patient was treated with IV Ativan x2 and Zofran with good control of  symptoms.  He is stable during ED stay.  He is actively trying to quit drinking alcohol.  He would like Librium taper to help curb withdrawals while he attempts to find treatment locally.  Currently no seizure-like activity or hallucinations to suggest delirium tremens.  Patient is tolerating fluids without vomiting.  Feel that he is a candidate for outpatient treatment.  He does seem reliable to return if symptoms do worsen. ? ?The patient's vital signs, pertinent lab work and imaging were reviewed and interpreted as discussed in the ED course. Hospitalization was considered for further testing, treatments, or serial exams/observation. However as patient is well-appearing, has a stable exam, and reassuring studies today, I do not feel that they warrant admission at this time. This plan was discussed with the patient who verbalizes agreement and comfort with this plan and seems reliable and able to return to the Emergency Department with worsening or changing symptoms.  ? ? ? ? ? ? ? ? ? ? ?Final Clinical Impression(s) / ED Diagnoses ?Final diagnoses:  ?Alcohol withdrawal syndrome without complication (HCC)  ? ? ?Rx / DC Orders ?ED Discharge Orders   ? ?      Ordered  ?  chlordiazePOXIDE (LIBRIUM) 25 MG capsule       ? 08/05/21 0105  ? ?  ?  ? ?  ? ? ?  ?Renne Crigler, PA-C ?08/05/21 7494 ? ?  ?[name], MD ?08/05/21 [phone] ? ?

## 2021-08-05 NOTE — Discharge Instructions (Signed)
Please read and follow all provided instructions. ? ?Your diagnoses today include:  ?1. Alcohol withdrawal syndrome without complication (HCC)   ? ? ?Tests performed today include: ?Complete blood cell count: ?Complete metabolic panel: ?Alcohol level: slightly high ?Vital signs. See below for your results today.  ? ?Medications prescribed:  ?Librium: Medication to help curb alcohol withdrawal ? ?Take any prescribed medications only as directed. ? ?Home care instructions:  ?Follow any educational materials contained in this packet. ? ?BE VERY CAREFUL not to take multiple medicines containing Tylenol (also called acetaminophen). Doing so can lead to an overdose which can damage your liver and cause liver failure and possibly death.  ? ?Follow-up instructions: ?Please follow-up with your primary care provider in the next 3 days for further evaluation of your symptoms.  ? ?Return instructions:  ?Please return to the Emergency Department if you experience worsening symptoms.  ?Please return if you have any other emergent concerns. ? ?Additional Information: ? ?Your vital signs today were: ?BP (!) 166/98   Pulse 89   Temp 98.5 ?F (36.9 ?C)   Resp 16   Ht 5\' 9"  (1.753 m)   Wt 133.8 kg   SpO2 98%   BMI 43.56 kg/m?  ?If your blood pressure (BP) was elevated above 135/85 this visit, please have this repeated by your doctor within one month. ?-------------- ? ?

## 2021-08-05 NOTE — ED Notes (Signed)
Patient reports feeling "about the same" post ativan administration.  Patient lying in bed, visitor at bedside.  ?

## 2022-01-28 DIAGNOSIS — I82409 Acute embolism and thrombosis of unspecified deep veins of unspecified lower extremity: Secondary | ICD-10-CM

## 2022-01-28 HISTORY — DX: Acute embolism and thrombosis of unspecified deep veins of unspecified lower extremity: I82.409

## 2022-02-21 ENCOUNTER — Emergency Department (HOSPITAL_COMMUNITY)
Admission: EM | Admit: 2022-02-21 | Discharge: 2022-02-22 | Disposition: A | Discharge status: Home / Self Care | Payer: Self-pay | Attending: Student | Admitting: Student

## 2022-02-21 ENCOUNTER — Encounter (HOSPITAL_COMMUNITY): Payer: Self-pay | Admitting: Emergency Medicine

## 2022-02-21 ENCOUNTER — Other Ambulatory Visit: Payer: Self-pay

## 2022-02-21 ENCOUNTER — Emergency Department (HOSPITAL_COMMUNITY): Payer: Self-pay

## 2022-02-21 DIAGNOSIS — N182 Chronic kidney disease, stage 2 (mild): Secondary | ICD-10-CM | POA: Insufficient documentation

## 2022-02-21 DIAGNOSIS — Z87891 Personal history of nicotine dependence: Secondary | ICD-10-CM | POA: Insufficient documentation

## 2022-02-21 DIAGNOSIS — I82462 Acute embolism and thrombosis of left calf muscular vein: Secondary | ICD-10-CM

## 2022-02-21 DIAGNOSIS — M79605 Pain in left leg: Secondary | ICD-10-CM | POA: Insufficient documentation

## 2022-02-21 LAB — CBC WITH DIFFERENTIAL/PLATELET
Abs Immature Granulocytes: 0.03 10*3/uL (ref 0.00–0.07)
Basophils Absolute: 0.1 10*3/uL (ref 0.0–0.1)
Basophils Relative: 1 %
Eosinophils Absolute: 0.1 10*3/uL (ref 0.0–0.5)
Eosinophils Relative: 1 %
HCT: 52.8 % — ABNORMAL HIGH (ref 39.0–52.0)
Hemoglobin: 19.1 g/dL — ABNORMAL HIGH (ref 13.0–17.0)
Immature Granulocytes: 0 %
Lymphocytes Relative: 20 %
Lymphs Abs: 1.9 10*3/uL (ref 0.7–4.0)
MCH: 38.1 pg — ABNORMAL HIGH (ref 26.0–34.0)
MCHC: 36.2 g/dL — ABNORMAL HIGH (ref 30.0–36.0)
MCV: 105.4 fL — ABNORMAL HIGH (ref 80.0–100.0)
Monocytes Absolute: 1.2 10*3/uL — ABNORMAL HIGH (ref 0.1–1.0)
Monocytes Relative: 13 %
Neutro Abs: 6.2 10*3/uL (ref 1.7–7.7)
Neutrophils Relative %: 65 %
Platelets: 171 10*3/uL (ref 150–400)
RBC: 5.01 MIL/uL (ref 4.22–5.81)
RDW: 14.8 % (ref 11.5–15.5)
WBC: 9.5 10*3/uL (ref 4.0–10.5)
nRBC: 0 % (ref 0.0–0.2)

## 2022-02-21 LAB — BASIC METABOLIC PANEL
Anion gap: 13 (ref 5–15)
BUN: 6 mg/dL (ref 6–20)
CO2: 20 mmol/L — ABNORMAL LOW (ref 22–32)
Calcium: 8.6 mg/dL — ABNORMAL LOW (ref 8.9–10.3)
Chloride: 103 mmol/L (ref 98–111)
Creatinine, Ser: 1.05 mg/dL (ref 0.61–1.24)
GFR, Estimated: >60 mL/min (ref >60)
Glucose, Bld: 102 mg/dL — ABNORMAL HIGH (ref 70–99)
Potassium: 4.1 mmol/L (ref 3.5–5.1)
Sodium: 136 mmol/L (ref 135–145)

## 2022-02-21 LAB — TROPONIN I (HIGH SENSITIVITY): Troponin I (High Sensitivity): 18 ng/L — ABNORMAL HIGH (ref <18)

## 2022-02-21 LAB — D-DIMER, QUANTITATIVE: D-Dimer, Quant: 0.97 ug/mL-FEU — ABNORMAL HIGH (ref 0.00–0.50)

## 2022-02-21 NOTE — ED Provider Triage Note (Signed)
Emergency Medicine Provider Triage Evaluation Note  Richard Osborn , a 46 y.o. male  was evaluated in triage.  Pt complains of reevaluation left lower extremity pain, swelling and shortness of breath.  Prior history of cellulitis to his right lower extremity.  Noted over last few days redness and pain to his left calf.  He also notes he has had some shortness of breath and some chest tightness.  Does have prior history of PEs not currently on anticoagulation.  Patient states he thought were due to immobilization due to long periods driving in a car.  Denies any recent pacing events.  No fever.  No recent falls or injury.  Patient concerned about cellulitis vs recurrent DVT  Review of Systems  Positive: LLE pain, swelling, sob Negative: fever  Physical Exam  BP (!) 148/98 (BP Location: Right Arm)   Pulse (!) 119   Temp 98.2 F (36.8 C) (Oral)   Resp 19   SpO2 98%  Gen:   Awake, no distress   Resp:  Normal effort  MSK:   Moves extremities without difficulty, edema left lower extremity with erythema, warmth and tenderness. Other:    Medical Decision Making  Medically screening exam initiated at 8:51 PM.  Appropriate orders placed.  Richard Osborn was informed that the remainder of the evaluation will be completed by another provider, this initial triage assessment does not replace that evaluation, and the importance of remaining in the ED until their evaluation is complete.  LLE swelling, SOB  [place] not current available, will get Ddimer   Alyn Riedinger A, PA-C 02/21/22 2053

## 2022-02-21 NOTE — ED Triage Notes (Addendum)
Pt c/o left leg pain started yesterday, went to UC and sent to ED for possible blood clot; pt reports he is a heavy drinker and was supposed to go to [facility] for inpatient detox but began having the leg pain; pt reports he last drank pta today; pt reports he normally drinks a fifth of 100 proof vodka daily

## 2022-02-22 ENCOUNTER — Emergency Department (HOSPITAL_BASED_OUTPATIENT_CLINIC_OR_DEPARTMENT_OTHER): Payer: Self-pay

## 2022-02-22 ENCOUNTER — Emergency Department (HOSPITAL_COMMUNITY): Payer: Self-pay

## 2022-02-22 ENCOUNTER — Other Ambulatory Visit (HOSPITAL_COMMUNITY): Payer: Self-pay

## 2022-02-22 DIAGNOSIS — R52 Pain, unspecified: Secondary | ICD-10-CM

## 2022-02-22 LAB — BRAIN NATRIURETIC PEPTIDE: B Natriuretic Peptide: 4.5 pg/mL (ref 0.0–100.0)

## 2022-02-22 LAB — TROPONIN I (HIGH SENSITIVITY): Troponin I (High Sensitivity): 19 ng/L — ABNORMAL HIGH (ref <18)

## 2022-02-22 MED ORDER — APIXABAN (ELIQUIS) VTE STARTER PACK (10MG AND 5MG)
ORAL_TABLET | ORAL | 0 refills | Status: DC
  Filled 2022-02-22: qty 1, fill #0

## 2022-02-22 MED ORDER — RIVAROXABAN (XARELTO) VTE STARTER PACK (15 & 20 MG)
ORAL_TABLET | ORAL | 0 refills | Status: DC
  Filled 2022-02-22: qty 51, 30d supply, fill #0

## 2022-02-22 MED ORDER — RIVAROXABAN 15 MG PO TABS: 15.0000 mg | ORAL_TABLET | Freq: Two times a day (BID) | ORAL | Status: DC

## 2022-02-22 MED ORDER — HYDROCODONE-ACETAMINOPHEN 5-325 MG PO TABS
1.0000 | ORAL_TABLET | Freq: Once | ORAL | Status: AC
  Administered 2022-02-22: 1 via ORAL
  Filled 2022-02-22: qty 1

## 2022-02-22 MED ORDER — RIVAROXABAN 10 MG PO TABS: 20.0000 mg | ORAL_TABLET | Freq: Every day | ORAL | Status: DC

## 2022-02-22 MED ORDER — APIXABAN 5 MG PO TABS
10.0000 mg | ORAL_TABLET | Freq: Once | ORAL | Status: AC
  Administered 2022-02-22: 10 mg via ORAL
  Filled 2022-02-22: qty 2

## 2022-02-22 MED ORDER — RIVAROXABAN 15 MG PO TABS
15.0000 mg | ORAL_TABLET | Freq: Two times a day (BID) | ORAL | 0 refills | Status: DC
  Filled 2022-02-22: qty 42, 21d supply, fill #0

## 2022-02-22 MED ORDER — CHLORDIAZEPOXIDE HCL 25 MG PO CAPS
50.0000 mg | ORAL_CAPSULE | Freq: Once | ORAL | Status: AC
  Administered 2022-02-22: 50 mg via ORAL
  Filled 2022-02-22: qty 2

## 2022-02-22 MED ORDER — RIVAROXABAN 20 MG PO TABS
20.0000 mg | ORAL_TABLET | Freq: Every day | ORAL | 0 refills | Status: DC
  Filled 2022-02-22: qty 30, 30d supply, fill #0

## 2022-02-22 MED ORDER — IOHEXOL 350 MG/ML SOLN
80.0000 mL | Freq: Once | INTRAVENOUS | Status: AC | PRN
  Administered 2022-02-22: 80 mL via INTRAVENOUS

## 2022-02-22 NOTE — ED Provider Notes (Signed)
[facility] EMERGENCY DEPARTMENT Provider Note  CSN: [id number] Arrival date & time: 02/21/22 2022  Chief Complaint(s) Leg Pain  HPI Richard Osborn is a 46 y.o. 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  Patient states that he was getting ready to go to [facility] for alcohol detox when he noticed his left leg pain pain, red and swollen.  Given his previous history of DVT PE, he comes the emergency department today.  He denies chest pain, shortness of breath, abdominal pain, nausea, vomiting or other systemic symptoms.   Past Medical History Past Medical History:  Diagnosis Date   Depressed    Sleep apnea    Patient Active Problem List   Diagnosis Date Noted   Substance induced mood disorder ([place]) 09/15/2020   Bilateral pulmonary embolism ([place]) 11/19/2018   ARF (acute renal failure) ([place]) 11/19/2018   Alcohol abuse with alcohol-induced mood disorder ([place]) 11/22/2016   Chronic alcoholism ([place])    Hypotension 07/06/2015   CKD (chronic kidney disease), stage II 07/06/2015   Morbid obesity due to excess calories ([place]) 06/26/2015   Sepsis ([place]) 06/26/2015   Cellulitis 06/24/2015   Cellulitis of right leg 06/24/2015   Acute kidney injury ([place]) 06/24/2015   Leucocytosis 06/24/2015   OSA (obstructive sleep apnea) 09/23/2014   Home Medication(s) Prior to Admission medications   Medication Sig Start Date End Date Taking? Authorizing Provider  chlordiazePOXIDE (LIBRIUM) 25 MG capsule 50mg  PO TID x 1D, then 25-50mg  PO BID X 1D, then 25-50mg  PO QD X 1D 08/05/21   Carlisle Cater, PA-C  hydrOXYzine (ATARAX/VISTARIL) 25 MG tablet Take 1 tablet (25 mg total) by mouth every 6 (six) hours as needed. For anxiety 09/18/20   Lindell Spar I, NP  nicotine polacrilex (NICORETTE) 2 MG gum Take 1 each (2 mg total) by mouth as needed. (May buy from  over the counter): For smoking cessation 09/18/20   Lindell Spar I, NP  traZODone (DESYREL) 50 MG tablet Take 1 tablet (50 mg total) by mouth at bedtime as needed for sleep. 09/18/20   Encarnacion Slates, NP                                                                                                                                    Past Surgical History Past Surgical History:  Procedure Laterality Date   arm surgery      NASAL SEPTUM SURGERY     ORIF ULNAR / RADIAL SHAFT FRACTURE     Family History Family History  Problem Relation Age of Onset   Diabetes Other        grandmother    Social History Social History   Tobacco Use   Smoking status: Former    Packs/day: 1.00    Years: 20.00  Total pack years: 20.00    Types: Cigarettes   Smokeless tobacco: Never  Vaping Use   Vaping Use: Never used  Substance Use Topics   Alcohol use: Yes    Alcohol/week: 5.0 standard drinks of alcohol    Types: 5 Shots of liquor per week    Comment: daily    Drug use: No   Allergies Patient has no known allergies.  Review of Systems Review of Systems  Cardiovascular:  Positive for leg swelling.    Physical Exam Vital Signs  I have reviewed the triage vital signs BP 129/76 (BP Location: Right Arm)   Pulse 90   Temp 98.1 F (36.7 C) (Oral)   Resp 18   Ht 5\' 9"  (1.753 m)   Wt 133.8 kg   SpO2 96%   BMI 43.56 kg/m   Physical Exam Constitutional:      General: He is not in acute distress.    Appearance: Normal appearance.  HENT:     Head: Normocephalic and atraumatic.     Nose: No congestion or rhinorrhea.  Eyes:     General:        Right eye: No discharge.        Left eye: No discharge.     Extraocular Movements: Extraocular movements intact.     Pupils: Pupils are equal, round, and reactive to light.  Cardiovascular:     Rate and Rhythm: Normal rate and regular rhythm.     Heart sounds: No murmur heard. Pulmonary:     Effort: No respiratory distress.     Breath  sounds: No wheezing or rales.  Abdominal:     General: There is no distension.     Tenderness: There is no abdominal tenderness.  Musculoskeletal:        General: Normal range of motion.     Cervical back: Normal range of motion.     Left lower leg: Edema present.  Skin:    General: Skin is warm and dry.     Findings: Erythema present.  Neurological:     General: No focal deficit present.     Mental Status: He is alert.     ED Results and Treatments Labs (all labs ordered are listed, but only abnormal results are displayed) Labs Reviewed  CBC WITH DIFFERENTIAL/PLATELET - Abnormal; Notable for the following components:      Result Value   Hemoglobin 19.1 (*)    HCT 52.8 (*)    MCV 105.4 (*)    MCH 38.1 (*)    MCHC 36.2 (*)    Monocytes Absolute 1.2 (*)    All other components within normal limits  BASIC METABOLIC PANEL - Abnormal; Notable for the following components:   CO2 20 (*)    Glucose, Bld 102 (*)    Calcium 8.6 (*)    All other components within normal limits  D-DIMER, QUANTITATIVE - Abnormal; Notable for the following components:   D-Dimer, Quant 0.97 (*)    All other components within normal limits  TROPONIN I (HIGH SENSITIVITY) - Abnormal; Notable for the following components:   Troponin I (High Sensitivity) 18 (*)    All other components within normal limits  TROPONIN I (HIGH SENSITIVITY) - Abnormal; Notable for the following components:   Troponin I (High Sensitivity) 19 (*)    All other components within normal limits  BRAIN NATRIURETIC PEPTIDE  Radiology VAS [place] LOWER EXTREMITY VENOUS (DVT) (7a-7p)  Result Date: 02/22/2022  Lower Venous DVT Study Patient Name:  Richard Osborn  Date of Exam:   02/22/2022 Medical Rec #: [id number]        Accession #:    [id number] Date of Birth: July 06, 1975         Patient Gender: M Patient Age:   66 years  Exam Location:  [facility] Procedure:      VAS [place] LOWER EXTREMITY VENOUS (DVT) Referring Phys: HALEY SAGE --------------------------------------------------------------------------------  Indications: Pain.  Comparison Study: 11/18/21 prior Performing Technologist: Archie Patten RVS  Examination Guidelines: A complete evaluation includes B-mode imaging, spectral Doppler, color Doppler, and power Doppler as needed of all accessible portions of each vessel. Bilateral testing is considered an integral part of a complete examination. Limited examinations for reoccurring indications may be performed as noted. The reflux portion of the exam is performed with the patient in reverse Trendelenburg.  +---------+---------------+---------+-----------+----------+--------------+ RIGHT    CompressibilityPhasicitySpontaneityPropertiesThrombus Aging +---------+---------------+---------+-----------+----------+--------------+ CFV      Full           Yes      Yes                                 +---------+---------------+---------+-----------+----------+--------------+ SFJ      Full                                                        +---------+---------------+---------+-----------+----------+--------------+ FV Prox  Full                                                        +---------+---------------+---------+-----------+----------+--------------+ FV Mid   Full                                                        +---------+---------------+---------+-----------+----------+--------------+ FV DistalFull                                                        +---------+---------------+---------+-----------+----------+--------------+ PFV      Full                                                        +---------+---------------+---------+-----------+----------+--------------+ POP      Full           Yes      Yes                                  +---------+---------------+---------+-----------+----------+--------------+ PTV  Full                                                        +---------+---------------+---------+-----------+----------+--------------+ PERO     Full                                                        +---------+---------------+---------+-----------+----------+--------------+   +---------+---------------+---------+-----------+----------+-----------------+ LEFT     CompressibilityPhasicitySpontaneityPropertiesThrombus Aging    +---------+---------------+---------+-----------+----------+-----------------+ CFV      Full           Yes      Yes                                    +---------+---------------+---------+-----------+----------+-----------------+ SFJ      Full                                                           +---------+---------------+---------+-----------+----------+-----------------+ FV Prox  Full                                                           +---------+---------------+---------+-----------+----------+-----------------+ FV Mid   Full                                                           +---------+---------------+---------+-----------+----------+-----------------+ FV DistalFull                                                           +---------+---------------+---------+-----------+----------+-----------------+ PFV      Full                                                           +---------+---------------+---------+-----------+----------+-----------------+ POP      Full           Yes      Yes                                    +---------+---------------+---------+-----------+----------+-----------------+ PTV      None  Age Indeterminate +---------+---------------+---------+-----------+----------+-----------------+ PERO                                                  patent  by color   +---------+---------------+---------+-----------+----------+-----------------+ GSV      None                                         Age Indeterminate +---------+---------------+---------+-----------+----------+-----------------+     Summary: RIGHT: - There is no evidence of deep vein thrombosis in the lower extremity.  - No cystic structure found in the popliteal fossa.  LEFT: - Findings consistent with age indeterminate deep vein thrombosis involving the left posterior tibial veins. - Findings consistent with age indeterminate superficial vein thrombosis involving the left great saphenous vein. - No cystic structure found in the popliteal fossa.  *See table(s) above for measurements and observations. Electronically signed by Deitra Mayo MD on 02/22/2022 at 1:09:26 PM.    Final    CT Angio Chest PE W and/or Wo Contrast  Result Date: 02/22/2022 CLINICAL DATA:  Shortness of breath and left lower extremity pain and edema. History of prior pulmonary embolism. EXAM: CT ANGIOGRAPHY CHEST WITH CONTRAST TECHNIQUE: Multidetector CT imaging of the chest was performed using the standard protocol during bolus administration of intravenous contrast. Multiplanar CT image reconstructions and MIPs were obtained to evaluate the vascular anatomy. RADIATION DOSE REDUCTION: This exam was performed according to the departmental dose-optimization program which includes automated exposure control, adjustment of the mA and/or kV according to patient size and/or use of iterative reconstruction technique. CONTRAST:  59mL OMNIPAQUE IOHEXOL 350 MG/ML SOLN COMPARISON:  Prior CT of the chest on 11/19/2018 FINDINGS: Cardiovascular: The pulmonary arteries are adequately opacified. There is no evidence of pulmonary embolism. Central pulmonary arteries are of normal caliber. The thoracic aorta is normal in caliber. No significant aortic atherosclerosis. The heart size is normal. No pericardial fluid identified. No  visualized calcified coronary artery plaque. Mediastinum/Nodes: No enlarged mediastinal, hilar, or axillary lymph nodes. Thyroid gland, trachea, and esophagus demonstrate no significant findings. Lungs/Pleura: There is no evidence of pulmonary edema, consolidation, pneumothorax, nodule or pleural fluid. Upper Abdomen: Progressive hepatic steatosis since the prior study. No overt cirrhotic morphology of the visualized liver. Musculoskeletal: No chest wall abnormality. No acute or significant osseous findings. Review of the MIP images confirms the above findings. IMPRESSION: 1. No evidence of pulmonary embolism. 2. Progressive hepatic steatosis. Electronically Signed   By: Aletta Edouard M.D.   On: 02/22/2022 08:53   DG Chest 2 View  Result Date: 02/21/2022 CLINICAL DATA:  Dyspnea EXAM: CHEST - 2 VIEW COMPARISON:  Radiographs 11/18/2018 FINDINGS: Stable mild cardiomegaly. Pulmonary vascular congestion. No focal consolidation, pleural effusion, or pneumothorax. Normal variant right azygous fissure. No acute osseous abnormality. IMPRESSION: Cardiomegaly and pulmonary vascular congestion. Electronically Signed   By: Placido Sou M.D.   On: 02/21/2022 21:22    Pertinent labs & imaging results that were available during my care of the patient were reviewed by me and considered in my medical decision making (see MDM for details).  Medications Ordered in ED Medications  iohexol (OMNIPAQUE) 350 MG/ML injection 80 mL (80 mLs Intravenous Contrast Given 02/22/22 0840)  chlordiazePOXIDE (LIBRIUM) capsule 50 mg (50 mg Oral Given 02/22/22 1013)  apixaban (ELIQUIS) tablet 10 mg (10 mg Oral Given 02/22/22 1324)                                                                                                                                     Procedures Procedures  (including critical care time)  Medical Decision Making / ED Course   This patient presents to the ED for concern of leg pain and swelling, this  involves an extensive number of treatment options, and is a complaint that carries with it a high risk of complications and morbidity.  The differential diagnosis includes DVT, superficial thrombophlebitis, cellulitis, phlegmasia  MDM: Patient seen in the emergency department for evaluation of left lower extremity edema and swelling.  Physical exam reveals unilateral swelling and erythema from the calf down to the ankle with tenderness in the calf on the left.  Laboratory evaluation with a hemoglobin of 19.1 likely secondary to his history of OSA.  Dimer is elevated to 0.97.  DVT ultrasound showing age-indeterminate DVT of the left posterior tibial vein as well as an age-indeterminate left great saphenous superficial vein thrombosis.  Chest x-ray with pulmonary edema but CT PE does not show this all in the patient has no shortness of breath at this time.  CT PE with no pulmonary embolism.  Patient started on Eliquis and given that the patient currently is unable to afford any of his medication, I spoke with the [facility] team who has set up a outpatient PCP appointment to help the patient obtain his orange card.  I also ordered Xarelto to bedside as the patient has already used his trial for Eliquis.  Ace wrap applied and patient discharged with outpatient PCP follow-up.   Additional history obtained:  -External records from outside source obtained and reviewed including: Chart review including previous notes, labs, imaging, consultation notes   Lab Tests: -I ordered, reviewed, and interpreted labs.   The pertinent results include:   Labs Reviewed  CBC WITH DIFFERENTIAL/PLATELET - Abnormal; Notable for the following components:      Result Value   Hemoglobin 19.1 (*)    HCT 52.8 (*)    MCV 105.4 (*)    MCH 38.1 (*)    MCHC 36.2 (*)    Monocytes Absolute 1.2 (*)    All other components within normal limits  BASIC METABOLIC PANEL - Abnormal; Notable for the following components:   CO2 20 (*)     Glucose, Bld 102 (*)    Calcium 8.6 (*)    All other components within normal limits  D-DIMER, QUANTITATIVE - Abnormal; Notable for the following components:   D-Dimer, Quant 0.97 (*)    All other components within normal limits  TROPONIN I (HIGH SENSITIVITY) - Abnormal; Notable for the following components:   Troponin I (High Sensitivity) 18 (*)    All other components within normal limits  TROPONIN I (HIGH SENSITIVITY) - Abnormal; Notable for  the following components:   Troponin I (High Sensitivity) 19 (*)    All other components within normal limits  BRAIN NATRIURETIC PEPTIDE      Imaging Studies ordered: I ordered imaging studies including chest x-ray, CT PE, DVT ultrasound I independently visualized and interpreted imaging. I agree with the radiologist interpretation   Medicines ordered and prescription drug management: Meds ordered this encounter  Medications   iohexol (OMNIPAQUE) 350 MG/ML injection 80 mL   chlordiazePOXIDE (LIBRIUM) capsule 50 mg   apixaban (ELIQUIS) tablet 10 mg    -I have reviewed the patients home medicines and have made adjustments as needed  Critical interventions none  Consultations Obtained: I requested consultation with the [facility] team,  and discussed lab and imaging findings as well as pertinent plan - they recommend: 30-day supply to bedside and outpatient PCP follow-up   Cardiac Monitoring: The patient was maintained on a cardiac monitor.  I personally viewed and interpreted the cardiac monitored which showed an underlying rhythm of: NSR  Social Determinants of Health:  Factors impacting patients care include: Daily alcohol use   Reevaluation: After the interventions noted above, I reevaluated the patient and found that they have :improved  Co morbidities that complicate the patient evaluation  Past Medical History:  Diagnosis Date   Depressed    Sleep apnea       Dispostion: I considered admission for this patient, but he  does not meet inpatient criteria for admission and he is safe for discharge with outpatient PCP follow-up     Final Clinical Impression(s) / ED Diagnoses Final diagnoses:  None     @PCDICTATION @    Kharee Lesesne, Debe Coder, MD 02/22/22 1559

## 2022-02-22 NOTE — Discharge Planning (Signed)
RNCM consulted regarding PCP establishment and insurance enrollment. Pt presented to [facility] ED today with leg pain. RNCM met with pt at bedside; pt confirms not having access to follow up care with PCP or insurance coverage. Discussed with patient importance and benefits of establishing PCP, and not utilizing the ED for primary care needs. Pt verbalized understanding and is in agreement. Discussed other options, provided list of local  affordable PCPs.  Pt voiced interest in [facility] Internal Medicine.  RNCM obtained appointment on (10/4), time (0915)  and placed on After Visit Summary paperwork.  No further case management needs communicated at this time. Marlon Vonruden J. Clydene Laming, [place], [place], [place]

## 2022-02-22 NOTE — ED Notes (Signed)
Pulled patient back to have blood drawn and request to have provider reassess due to

## 2022-02-22 NOTE — Progress Notes (Signed)
Lower extremity venous has been completed.   Preliminary results in CV Proc.   Richard Osborn 02/22/2022 8:57 AM

## 2022-02-24 ENCOUNTER — Emergency Department (HOSPITAL_COMMUNITY)
Admission: EM | Admit: 2022-02-24 | Discharge: 2022-02-25 | Disposition: A | Discharge status: Home / Self Care | Payer: Self-pay | Attending: Emergency Medicine | Admitting: Emergency Medicine

## 2022-02-24 ENCOUNTER — Other Ambulatory Visit: Payer: Self-pay

## 2022-02-24 ENCOUNTER — Encounter (HOSPITAL_COMMUNITY): Payer: Self-pay

## 2022-02-24 DIAGNOSIS — M79605 Pain in left leg: Secondary | ICD-10-CM

## 2022-02-24 DIAGNOSIS — I82442 Acute embolism and thrombosis of left tibial vein: Secondary | ICD-10-CM | POA: Insufficient documentation

## 2022-02-24 DIAGNOSIS — Z7901 Long term (current) use of anticoagulants: Secondary | ICD-10-CM | POA: Insufficient documentation

## 2022-02-24 MED ORDER — KETOROLAC TROMETHAMINE 15 MG/ML IJ SOLN
15.0000 mg | Freq: Once | INTRAMUSCULAR | Status: AC
  Administered 2022-02-24: 15 mg via INTRAVENOUS
  Filled 2022-02-24: qty 1

## 2022-02-24 MED ORDER — OXYCODONE-ACETAMINOPHEN 5-325 MG PO TABS
1.0000 | ORAL_TABLET | Freq: Once | ORAL | Status: AC
  Administered 2022-02-24: 1 via ORAL
  Filled 2022-02-24: qty 1

## 2022-02-24 NOTE — ED Provider Triage Note (Signed)
Emergency Medicine Provider Triage Evaluation Note  Richard Osborn , a 46 y.o. male  was evaluated in triage.  Pt complains of persistent L leg pain. Diagnosed with DVT at [facility] several days ago, taking xarelto as prescribed. Now having worsening pain and swelling of the left lower leg.   Review of Systems  Positive: As above Negative: CP, SOB, fever  Physical Exam  BP (!) 174/104 (BP Location: Left Arm)   Pulse 100   Temp 98 F (36.7 C) (Oral)   Resp 18   Ht 5\' 9"  (1.753 m)   Wt 133.8 kg   SpO2 97%   BMI 43.56 kg/m  Gen:   Awake, no distress   Resp:  Normal effort  MSK:   Moves extremities without difficulty  Other:  Edematous and erythematous left calf  Medical Decision Making  Medically screening exam initiated at 9:47 PM.  Appropriate orders placed.  Valinda Hoar was informed that the remainder of the evaluation will be completed by another provider, this initial triage assessment does not replace that evaluation, and the importance of remaining in the ED until their evaluation is complete.     Kateri Plummer, Hershal Coria 02/24/22 2152

## 2022-02-24 NOTE — ED Triage Notes (Signed)
Patient was seen at [facility]. Patient was found to have 2 blood clots in the left lower leg. Patient now has pain that radiates from the left lower leg to the groin area. Patient was given an RX for Xarelto yesterday.

## 2022-02-25 MED ORDER — OXYCODONE HCL 5 MG PO TABS: 5.0000 mg | ORAL_TABLET | Freq: Four times a day (QID) | ORAL | 0 refills | Status: DC | PRN

## 2022-02-25 NOTE — Discharge Instructions (Addendum)
Continue taking your Xarelto as prescribed.  Try and elevate your left leg is much as possible above the level of your heart to reduce discomfort as well as lower extremity swelling.  You may use oxycodone if pain is severe and unrelieved with over-the-counter Tylenol.  Take as prescribed and do not drive or drink alcohol after taking this medication as it may make you drowsy and impair your judgment.  Continue follow-up as scheduled on 03/02/2022.  Return for new or concerning symptoms.

## 2022-02-25 NOTE — ED Provider Notes (Signed)
[facility] DEPT Provider Note   CSN: [id number] Arrival date & time: 02/24/22  2107     History  Chief Complaint  Patient presents with   Leg Pain    Blood clot in the left leg    Richard Osborn is a 46 y.o. male.  46 year old male presents to the emergency department for evaluation of left lower extremity pain.  He was evaluated at [facility] 3 days ago and was diagnosed with a left lower extremity DVT.  There is no evidence of PE on CT angiogram completed at this encounter.  Patient was supposed to be on chronic anticoagulation, but could not afford his Eliquis.  He has been taking Xarelto as prescribed since this new diagnosis.  Complains of persistent pain to the left lower extremity with waxing and waning edema.  He has taken Tylenol for symptoms without relief.  Did have some improvement in the pain when treated with a narcotic during his last ED encounter, but he has not had any prescriptions at home to use for this.  Is scheduled for outpatient follow-up for his DVT on 03/02/2022.  The history is provided by the patient. No language interpreter was used.  Leg Pain      Home Medications Prior to Admission medications   Medication Sig Start Date End Date Taking? Authorizing Provider  oxyCODONE (ROXICODONE) 5 MG immediate release tablet Take 1 tablet (5 mg total) by mouth every 6 (six) hours as needed for severe pain. 02/25/22  Yes Antonietta Breach, PA-C  RIVAROXABAN [employer]) VTE STARTER PACK (15 & 20 MG) Follow package directions: Take one 15mg  tablet by mouth twice a day. On day 22, switch to one 20mg  tablet once a day. Take with food. Patient taking differently: Take 15-20 mg by mouth as directed. Follow package directions: Take one 15mg  tablet by mouth twice a day. On day 22, switch to one 20mg  tablet once a day. Take with food. 02/22/22  Yes Kommor, Madison, MD  chlordiazePOXIDE (LIBRIUM) 25 MG capsule 50mg  PO TID x 1D, then 25-50mg  PO BID X 1D,  then 25-50mg  PO QD X 1D Patient not taking: Reported on 02/24/2022 08/05/21   Carlisle Cater, PA-C  hydrOXYzine (ATARAX/VISTARIL) 25 MG tablet Take 1 tablet (25 mg total) by mouth every 6 (six) hours as needed. For anxiety Patient not taking: Reported on 02/24/2022 09/18/20   Lindell Spar I, NP  nicotine polacrilex (NICORETTE) 2 MG gum Take 1 each (2 mg total) by mouth as needed. (May buy from over the counter): For smoking cessation Patient not taking: Reported on 02/24/2022 09/18/20   Lindell Spar I, NP  traZODone (DESYREL) 50 MG tablet Take 1 tablet (50 mg total) by mouth at bedtime as needed for sleep. Patient not taking: Reported on 02/24/2022 09/18/20   Lindell Spar I, NP      Allergies    Patient has no known allergies.    Review of Systems   Review of Systems Ten systems reviewed and are negative for acute change, except as noted in the HPI.    Physical Exam Updated Vital Signs BP 134/78   Pulse 83   Temp 98 F (36.7 C) (Oral)   Resp 18   Ht 5\' 9"  (1.753 m)   Wt 133.8 kg   SpO2 98%   BMI 43.56 kg/m   Physical Exam Vitals and nursing note reviewed.  Constitutional:      General: He is not in acute distress.    Appearance: He  is well-developed. He is not diaphoretic.     Comments: Nontoxic appearing and in NAD  HENT:     Head: Normocephalic and atraumatic.  Eyes:     General: No scleral icterus.    Conjunctiva/sclera: Conjunctivae normal.  Cardiovascular:     Rate and Rhythm: Normal rate and regular rhythm.     Pulses: Normal pulses.     Comments: DP pulse 2+ in the LLE Pulmonary:     Effort: Pulmonary effort is normal. No respiratory distress.  Musculoskeletal:     Cervical back: Normal range of motion.     Comments: Erythema to the distal left lower extremity with some warmth to touch.  This extends linearly along the medial aspect of the left thigh.  No associated drainage, weeping.  Compartments of the left lower extremity are soft, compressible.  Skin:     General: Skin is warm and dry.     Coloration: Skin is not pale.     Findings: No erythema or rash.  Neurological:     Mental Status: He is alert and oriented to person, place, and time.     Coordination: Coordination normal.  Psychiatric:        Behavior: Behavior normal.     ED Results / Procedures / Treatments   Labs (all labs ordered are listed, but only abnormal results are displayed) Labs Reviewed - No data to display  EKG None  Radiology No results found.  Procedures Procedures    Medications Ordered in ED Medications  oxyCODONE-acetaminophen (PERCOCET/ROXICET) 5-325 MG per tablet 1 tablet (1 tablet Oral Given 02/24/22 2201)  ketorolac (TORADOL) 15 MG/ML injection 15 mg (15 mg Intravenous Given 02/24/22 2357)    ED Course/ Medical Decision Making/ A&P                           Medical Decision Making Risk Prescription drug management.   This patient presents to the ED for concern of LLE pain, this involves an extensive number of treatment options, and is a complaint that carries with it a high risk of complications and morbidity.  The differential diagnosis includes DVT vs compartment syndrome vs fx vs cellulitis   Co morbidities that complicate the patient evaluation  Obesity VTE   Additional history obtained:  Additional history obtained from wife at bedside External records from outside source obtained and reviewed including venous duplex and CTA chest from 02/22/22   Cardiac Monitoring:  The patient was maintained on a cardiac monitor.  I personally viewed and interpreted the cardiac monitored which showed an underlying rhythm of: NSR   Medicines ordered and prescription drug management:  I ordered medication including Oxycodone and Toradol for pain  Reevaluation of the patient after these medicines showed that the patient improved I have reviewed the patients home medicines and have made adjustments as needed   Problem List / ED  Course:  46 year old male with recent diagnosis of DVT.  His physical exam findings are consistent with this diagnosis.  I do not appreciate the patient to have a developing cellulitis.  He denies any outpatient fevers and does not meet criteria for SIRS/sepsis.  No concern for compartment syndrome on exam. Have had an extensive discussion with the patient regarding outpatient symptom management including the use of Tylenol for pain.  Reiterated the need to refrain from NSAID use given chronic anticoagulation. I do feel it is reasonable for the patient to be discharged with a short course  of oxycodone.  I have reviewed his history which does include alcohol abuse as well as psychiatric illness and prior suicide attempt.  Will limit quantity of medication prescribed given these factors.  Any additional refills can be managed at his upcoming outpatient follow-up visit.   Reevaluation:  After the interventions noted above, I reevaluated the patient and found that they have :improved   Social Determinants of Health:  Good social support Uninsured    Dispostion:  After consideration of the diagnostic results and the patients response to treatment, I feel that the patent would benefit from oxycodone PRN for severe pain. Scheduled for outpatient f/u in 1 week. Return precautions discussed and provided. Patient discharged in stable condition with no unaddressed concerns.          Final Clinical Impression(s) / ED Diagnoses Final diagnoses:  Deep vein thrombosis (DVT) of tibial vein of left lower extremity, unspecified chronicity (HCC)  Left leg pain    Rx / DC Orders ED Discharge Orders          Ordered    oxyCODONE (ROXICODONE) 5 MG immediate release tablet  Every 6 hours PRN        02/25/22 0009              Antony Madura, PA-C 02/25/22 0321    [name], MD 02/25/22 [phone]

## 2022-03-02 ENCOUNTER — Other Ambulatory Visit: Payer: Self-pay

## 2022-03-02 ENCOUNTER — Encounter: Payer: Self-pay | Admitting: Internal Medicine

## 2022-03-02 ENCOUNTER — Other Ambulatory Visit (HOSPITAL_COMMUNITY): Payer: Self-pay

## 2022-03-02 ENCOUNTER — Ambulatory Visit: Payer: Self-pay | Admitting: Internal Medicine

## 2022-03-02 VITALS — BP 123/76 | HR 98 | Temp 98.2°F | Ht 69.0 in | Wt 331.0 lb

## 2022-03-02 DIAGNOSIS — Z720 Tobacco use: Secondary | ICD-10-CM

## 2022-03-02 DIAGNOSIS — Z87891 Personal history of nicotine dependence: Secondary | ICD-10-CM

## 2022-03-02 DIAGNOSIS — D751 Secondary polycythemia: Secondary | ICD-10-CM

## 2022-03-02 DIAGNOSIS — I824Y9 Acute embolism and thrombosis of unspecified deep veins of unspecified proximal lower extremity: Secondary | ICD-10-CM

## 2022-03-02 DIAGNOSIS — G4733 Obstructive sleep apnea (adult) (pediatric): Secondary | ICD-10-CM

## 2022-03-02 DIAGNOSIS — Z23 Encounter for immunization: Secondary | ICD-10-CM

## 2022-03-02 DIAGNOSIS — F1020 Alcohol dependence, uncomplicated: Secondary | ICD-10-CM

## 2022-03-02 MED ORDER — NALTREXONE HCL 50 MG PO TABS
50.0000 mg | ORAL_TABLET | Freq: Every day | ORAL | 1 refills | Status: DC
  Filled 2022-03-02: qty 30, 30d supply, fill #0
  Filled 2022-05-04: qty 30, 30d supply, fill #1

## 2022-03-02 MED ORDER — RIVAROXABAN 20 MG PO TABS
20.0000 mg | ORAL_TABLET | Freq: Every day | ORAL | 2 refills | Status: DC
  Filled 2022-03-02: qty 30, 30d supply, fill #0
  Filled 2022-05-04 – 2022-05-10 (×4): qty 30, 30d supply, fill #1
  Filled 2022-06-09 – 2022-06-10 (×2): qty 30, 30d supply, fill #2

## 2022-03-02 MED ORDER — NICOTINE 21 MG/24HR TD PT24
21.0000 mg | MEDICATED_PATCH | TRANSDERMAL | 1 refills | Status: DC
  Filled 2022-03-02: qty 28, 28d supply, fill #0
  Filled 2022-05-04: qty 28, 28d supply, fill #1

## 2022-03-02 NOTE — Patient Instructions (Addendum)
Dear Richard Osborn,  Thank you for trusting [place] with your care today.   We talked about your blood clot, alcohol use, and smoking. For your blood clot, I have refilled your Xarelto prescription. Please complete your orange card paperwork so that we can complete the hypercoagulable workup.  For your alcohol use, I have placed a referral to our social worker and written a prescription for naltrexone.  For your smoking, I have written for a nicotine patch.  For your CPAP machine, our clinic and social worker will help find a solution to this issue for you.  Please return in 1 month.

## 2022-03-02 NOTE — Progress Notes (Signed)
   CC: ED fu  HPI:Mr.Richard Osborn is a 46 y.o. male who presents for evaluation of ED fu. Please see individual problem based A/P for details.  Leg swelling has been improving. Pain controlled. Has been compliant with the Xarelto that was given to him at ED.  Patient reports consistent CPAP machine use, but states that the mask and strap are broken, do not fit well, and poor seal. Patient is not able to replace broken parts due to outstanding balance with company.   Patient drinking 1/5 bottle vodka per day. He has withdrawn before. He is asking about [facility] referral for alcohol detox. Has never seen GI nor had scope before.  Patient smokes 1ppd. Wanting to quit. Was able to quit with nicotine patch. States he just got addicted to the gum. Reports dream issues with Chantix.  Depression, PHQ-9: Based on the patients  score we have .  Past Medical History:  Diagnosis Date   Depressed    Sleep apnea    Review of Systems:   Review of Systems  Constitutional: Negative.   Respiratory: Negative.    Cardiovascular:  Positive for leg swelling.  Gastrointestinal: Negative.   Neurological: Negative.      Physical Exam: Vitals:   03/02/22 0956  BP: 123/76  Pulse: 98  Temp: 98.2 F (36.8 C)  TempSrc: Oral  SpO2: 95%  Weight: (!) 331 lb (150.1 kg)  Height: 5\' 9"  (1.753 m)     General: obese, NAD HEENT: Conjunctiva nl , antiicteric sclerae, moist mucous membranes, no exudate or erythema Cardiovascular: Normal rate, regular rhythm.  No murmurs, rubs, or gallops, left lower pitting extremity and erythema, rle non-pitting edema. Pulmonary : Equal breath sounds, No wheezes, rales, or rhonchi Abdominal: soft, nontender,  bowel sounds present Ext: No edema in lower extremities, no tenderness to palpation of lower extremities.   Assessment & Plan:   See Encounters Tab for problem based charting.  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 This necessitate that he be on indefinite  anticoagulation. He would likely benefit from hypercoagulable workup despite existing risk factors of erythrocytosis and obesity, unfortunately, he is without insurance and cannot afford complete workup. - Xarelto Rx sent to [facility] IM program - orange card paperwork, then hypercoag workup. SW to assist with orange card - conservative measures for symptom control, need to avoid NSAIDs given [facility]  Given history of withdrawal, patient would likely need to be monitored inpatient during this process. Encouraged him to continue to work towards cessation. Social work referral placed to assist. Rx for Naltrexone provided. Patient will need to be evaluated by GI once he has insurance since he is at risk developing esophageal varices. Is also due for colonoscopy. - social work referral - naltrexone  Erythrocytosis noted on CBC.  May be related to poorly managed sleep apnea and smoking.  Will continue to follow  - Nicotine patch - social work referral  Patient discussed with Dr. Jimmye Norman

## 2022-03-03 DIAGNOSIS — I82409 Acute embolism and thrombosis of unspecified deep veins of unspecified lower extremity: Secondary | ICD-10-CM | POA: Insufficient documentation

## 2022-03-04 ENCOUNTER — Encounter: Payer: Self-pay | Admitting: Internal Medicine

## 2022-03-04 ENCOUNTER — Other Ambulatory Visit (HOSPITAL_COMMUNITY): Payer: Self-pay

## 2022-03-04 DIAGNOSIS — Z720 Tobacco use: Secondary | ICD-10-CM | POA: Insufficient documentation

## 2022-03-04 DIAGNOSIS — D751 Secondary polycythemia: Secondary | ICD-10-CM | POA: Insufficient documentation

## 2022-03-04 NOTE — Assessment & Plan Note (Signed)
Erythrocytosis noted on CBC.  May be related to poorly managed sleep apnea and smoking.  Will continue to follow.

## 2022-03-04 NOTE — Assessment & Plan Note (Signed)
Patient drinking 1/5 bottle vodka per day. He has withdrawn before. He is asking about [facility] referral for alcohol detox. Has never seen GI nor had scope before.   No external stigmata of cirrhosis notable on exam.   Given history of withdrawal, patient would likely need to be monitored inpatient during this process. Encouraged him to continue to work towards cessation. Social work referral placed to assist. Rx for Naltrexone provided. Patient will need to be evaluated by GI once he has insurance since he is at risk developing esophageal varices. Is also due for colonoscopy. - social work referral - naltrexone

## 2022-03-04 NOTE — Assessment & Plan Note (Signed)
Patient smokes 1ppd. Wanting to quit. Was able to quit with nicotine patch. States he just got addicted to the gum. Reports dream issues with Chantix.  - Nicotine patch - social work referral

## 2022-03-04 NOTE — Assessment & Plan Note (Signed)
Patient reports consistent CPAP machine use, but states that the mask and strap are broken, do not fit well, and poor seal. Patient is not able to replace broken parts due to outstanding balance with company.

## 2022-03-04 NOTE — Assessment & Plan Note (Addendum)
Leg swelling has been improving. Pain controlled. Has been compliant with the Xarelto that was given to him at ED.  Does have some pitting edema left leg with erythema.   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 This necessitate that he be on indefinite anticoagulation. He would likely benefit from hypercoagulable workup despite existing risk factors of erythrocytosis and obesity, unfortunately, he is without insurance and cannot afford complete workup. - Xarelto Rx sent to [facility] IM program - orange card paperwork, then hypercoag workup. SW to assist with orange card - conservative measures for symptom control, need to avoid NSAIDs given [facility]

## 2022-03-13 NOTE — Progress Notes (Signed)
Internal Medicine Clinic Attending ° °Case discussed with Dr. Gawaluck  At the time of the visit.  We reviewed the resident’s history and exam and pertinent patient test results.  I agree with the assessment, diagnosis, and plan of care documented in the resident’s note.  °

## 2022-03-22 ENCOUNTER — Other Ambulatory Visit (HOSPITAL_COMMUNITY): Payer: Self-pay

## 2022-03-22 ENCOUNTER — Telehealth (HOSPITAL_COMMUNITY): Payer: Self-pay

## 2022-03-22 NOTE — Telephone Encounter (Signed)
Transitions of Care Pharmacy   Call attempted for a pharmacy transitions of care follow-up. HIPAA appropriate voicemail was left with call back information provided.   Call attempt #1. Will follow-up in 2-3 days.   Tomy Khim E. Marsh, PharmD PGY-1 [facility] Resident    

## 2022-04-01 NOTE — Progress Notes (Deleted)
Chronic alcoholism Patient drinking 1/5 bottle vodka per day. He has withdrawn before. He is asking about [facility] referral for alcohol detox. Has never seen GI nor had scope before.    No external stigmata of cirrhosis notable on exam.    Given history of withdrawal, patient would likely need to be monitored inpatient during this process. Encouraged him to continue to work towards cessation. Social work referral placed to assist. Rx for Naltrexone provided. Patient will need to be evaluated by GI once he has insurance since he is at risk developing esophageal varices. Is also due for colonoscopy. - social work referral - naltrexone Naltrexone 50mg  daily  TUD Dream issues with cahntix Nicotine 21mg  patches  DVTPE Leg swelling has been improving. Pain controlled. Has been compliant with the Xarelto that was given to him at ED.   Does have some pitting edema left leg with erythema.    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 This necessitate that he be on indefinite anticoagulation. He would likely benefit from hypercoagulable workup despite existing risk factors of erythrocytosis and obesity, unfortunately, he is without insurance and cannot afford complete workup. - Xarelto Rx sent to [facility] IM program - orange card paperwork, then hypercoag workup. SW to assist with orange card - conservative measures for symptom control, need to avoid NSAIDs given [facility]  Recent CBC hemoglobin 19.1, platelets normal, bmp normal 01/2022 Rivaroxaban 20 Trazadone 50   08/2020 LDL 106 A1c 4.9

## 2022-05-04 ENCOUNTER — Other Ambulatory Visit: Payer: Self-pay

## 2022-05-04 ENCOUNTER — Other Ambulatory Visit (HOSPITAL_COMMUNITY): Payer: Self-pay

## 2022-05-05 ENCOUNTER — Other Ambulatory Visit (HOSPITAL_COMMUNITY): Payer: Self-pay

## 2022-05-10 ENCOUNTER — Other Ambulatory Visit (HOSPITAL_COMMUNITY): Payer: Self-pay

## 2022-06-09 ENCOUNTER — Other Ambulatory Visit (HOSPITAL_COMMUNITY): Payer: Self-pay

## 2022-06-10 ENCOUNTER — Other Ambulatory Visit (HOSPITAL_COMMUNITY): Payer: Self-pay

## 2022-06-15 ENCOUNTER — Other Ambulatory Visit (HOSPITAL_COMMUNITY): Payer: Self-pay

## 2022-06-15 ENCOUNTER — Other Ambulatory Visit: Payer: Self-pay

## 2022-06-15 ENCOUNTER — Telehealth: Payer: Self-pay | Admitting: Gerontology

## 2022-06-15 MED ORDER — RIVAROXABAN 20 MG PO TABS
20.0000 mg | ORAL_TABLET | Freq: Every day | ORAL | 3 refills | Status: DC
  Filled 2022-06-23: qty 30, 30d supply, fill #0
  Filled 2022-07-14: qty 90, 90d supply, fill #0
  Filled 2022-10-11: qty 90, 90d supply, fill #1
  Filled 2022-12-29: qty 90, 90d supply, fill #2
  Filled 2023-03-30: qty 90, 90d supply, fill #3

## 2022-06-15 NOTE — Telephone Encounter (Signed)
I called [facility] to inform them of a new patient appointment for Thursday the 25th, at 11 am.

## 2022-06-23 ENCOUNTER — Ambulatory Visit: Payer: Self-pay | Admitting: Gerontology

## 2022-06-23 ENCOUNTER — Other Ambulatory Visit: Payer: Self-pay

## 2022-06-23 ENCOUNTER — Encounter: Payer: Self-pay | Admitting: Gerontology

## 2022-06-23 VITALS — BP 128/81 | HR 71 | Temp 97.7°F | Resp 16 | Ht 69.0 in | Wt 315.4 lb

## 2022-06-23 DIAGNOSIS — I824Y9 Acute embolism and thrombosis of unspecified deep veins of unspecified proximal lower extremity: Secondary | ICD-10-CM

## 2022-06-23 DIAGNOSIS — Z7689 Persons encountering health services in other specified circumstances: Secondary | ICD-10-CM | POA: Insufficient documentation

## 2022-06-23 DIAGNOSIS — Z720 Tobacco use: Secondary | ICD-10-CM

## 2022-06-23 MED ORDER — APIXABAN (ELIQUIS) VTE STARTER PACK (10MG AND 5MG)
ORAL_TABLET | ORAL | 0 refills | Status: DC
  Filled 2022-06-23: qty 74, 28d supply, fill #0
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
## 2022-06-24 LAB — COMPREHENSIVE METABOLIC PANEL
ALT: 28 IU/L (ref 0–44)
AST: 27 IU/L (ref 0–40)
Albumin/Globulin Ratio: 1.6 (ref 1.2–2.2)
Albumin: 4.2 g/dL (ref 4.1–5.1)
Alkaline Phosphatase: 59 IU/L (ref 44–121)
BUN/Creatinine Ratio: 13 (ref 9–20)
BUN: 13 mg/dL (ref 6–24)
Bilirubin Total: 1.2 mg/dL (ref 0.0–1.2)
CO2: 20 mmol/L (ref 20–29)
Calcium: 9.4 mg/dL (ref 8.7–10.2)
Chloride: 104 mmol/L (ref 96–106)
Creatinine, Ser: 0.98 mg/dL (ref 0.76–1.27)
Globulin, Total: 2.6 g/dL (ref 1.5–4.5)
Glucose: 90 mg/dL (ref 70–99)
Potassium: 4.9 mmol/L (ref 3.5–5.2)
Sodium: 139 mmol/L (ref 134–144)
Total Protein: 6.8 g/dL (ref 6.0–8.5)
eGFR: 96 mL/min/{1.73_m2} (ref >59)

## 2022-06-24 LAB — HEMOGLOBIN A1C
Est. average glucose Bld gHb Est-mCnc: 100 mg/dL
Hgb A1c MFr Bld: 5.1 % (ref 4.8–5.6)

## 2022-06-24 LAB — CBC WITH DIFFERENTIAL/PLATELET
Basophils Absolute: 0.1 10*3/uL (ref 0.0–0.2)
Basos: 1 %
EOS (ABSOLUTE): 0 10*3/uL (ref 0.0–0.4)
Eos: 1 %
Hematocrit: 48.4 % (ref 37.5–51.0)
Hemoglobin: 16.3 g/dL (ref 13.0–17.7)
Immature Grans (Abs): 0 10*3/uL (ref 0.0–0.1)
Immature Granulocytes: 0 %
Lymphocytes Absolute: 1.9 10*3/uL (ref 0.7–3.1)
Lymphs: 25 %
MCH: 31.8 pg (ref 26.6–33.0)
MCHC: 33.7 g/dL (ref 31.5–35.7)
MCV: 94 fL (ref 79–97)
Monocytes Absolute: 0.7 10*3/uL (ref 0.1–0.9)
Monocytes: 9 %
Neutrophils Absolute: 4.9 10*3/uL (ref 1.4–7.0)
Neutrophils: 64 %
Platelets: 201 10*3/uL (ref 150–450)
RBC: 5.13 x10E6/uL (ref 4.14–5.80)
RDW: 11.2 % — ABNORMAL LOW (ref 11.6–15.4)
WBC: 7.7 10*3/uL (ref 3.4–10.8)

## 2022-06-24 LAB — LIPID PANEL
Chol/HDL Ratio: 3.6 ratio (ref 0.0–5.0)
Cholesterol, Total: 178 mg/dL (ref 100–199)
HDL: 49 mg/dL (ref >39)
LDL Chol Calc (NIH): 115 mg/dL — ABNORMAL HIGH (ref 0–99)
Triglycerides: 73 mg/dL (ref 0–149)
VLDL Cholesterol Cal: 14 mg/dL (ref 5–40)

## 2022-07-10 ENCOUNTER — Other Ambulatory Visit: Payer: Self-pay

## 2022-07-13 ENCOUNTER — Other Ambulatory Visit: Payer: Self-pay

## 2022-07-14 ENCOUNTER — Other Ambulatory Visit: Payer: Self-pay

## 2022-07-21 ENCOUNTER — Other Ambulatory Visit: Payer: Self-pay

## 2022-07-21 ENCOUNTER — Ambulatory Visit: Payer: Self-pay | Admitting: Gerontology

## 2022-07-21 ENCOUNTER — Encounter: Payer: Self-pay | Admitting: Gerontology

## 2022-07-21 VITALS — BP 125/75 | HR 76 | Temp 97.7°F | Resp 16 | Ht 69.0 in | Wt 304.7 lb

## 2022-07-21 DIAGNOSIS — I824Y9 Acute embolism and thrombosis of unspecified deep veins of unspecified proximal lower extremity: Secondary | ICD-10-CM

## 2022-07-21 DIAGNOSIS — G4700 Insomnia, unspecified: Secondary | ICD-10-CM

## 2022-07-21 DIAGNOSIS — E785 Hyperlipidemia, unspecified: Secondary | ICD-10-CM

## 2022-07-21 MED ORDER — MELATONIN 3 MG PO CAPS
3.0000 mg | ORAL_CAPSULE | Freq: Every day | ORAL | 0 refills | Status: DC
  Filled 2022-07-21: qty 30, 30d supply, fill #0

## 2022-07-21 NOTE — Patient Instructions (Addendum)
Calorie Counting for Weight Loss Calories are units of energy. Your body needs a certain number of calories from food to keep going throughout the day. When you eat or drink more calories than your body needs, your body stores the extra calories mostly as fat. When you eat or drink fewer calories than your body needs, your body burns fat to get the energy it needs. Calorie counting means keeping track of how many calories you eat and drink each day. Calorie counting can be helpful if you need to lose weight. If you eat fewer calories than your body needs, you should lose weight. Ask your health care provider what a healthy weight is for you. For calorie counting to work, you will need to eat the right number of calories each day to lose a healthy amount of weight per week. A dietitian can help you figure out how many calories you need in a day and will suggest ways to reach your calorie goal. A healthy amount of weight to lose each week is usually 1-2 lb (0.5-0.9 kg). This usually means that your daily calorie intake should be reduced by 500-750 calories. Eating 1,200-1,500 calories a day can help most women lose weight. Eating 1,500-1,800 calories a day can help most men lose weight. What do I need to know about calorie counting? Work with your health care provider or dietitian to determine how many calories you should get each day. To meet your daily calorie goal, you will need to: Find out how many calories are in each food that you would like to eat. Try to do this before you eat. Decide how much of the food you plan to eat. Keep a food log. Do this by writing down what you ate and how many calories it had. To successfully lose weight, it is important to balance calorie counting with a healthy lifestyle that includes regular activity. Where do I find calorie information?  The number of calories in a food can be found on a Nutrition Facts label. If a food does not have a Nutrition Facts label, try  to look up the calories online or ask your dietitian for help. Remember that calories are listed per serving. If you choose to have more than one serving of a food, you will have to multiply the calories per serving by the number of servings you plan to eat. For example, the label on a package of bread might say that a serving size is 1 slice and that there are 90 calories in a serving. If you eat 1 slice, you will have eaten 90 calories. If you eat 2 slices, you will have eaten 180 calories. How do I keep a food log? After each time that you eat, record the following in your food log as soon as possible: What you ate. Be sure to include toppings, sauces, and other extras on the food. How much you ate. This can be measured in cups, ounces, or number of items. How many calories were in each food and drink. The total number of calories in the food you ate. Keep your food log near you, such as in a pocket-sized notebook or on an app or website on your mobile phone. Some programs will calculate calories for you and show you how many calories you have left to meet your daily goal. What are some portion-control tips? Know how many calories are in a serving. This will help you know how many servings you can have of a certain   food. Use a measuring cup to measure serving sizes. You could also try weighing out portions on a kitchen scale. With time, you will be able to estimate serving sizes for some foods. Take time to put servings of different foods on your favorite plates or in your favorite bowls and cups so you know what a serving looks like. Try not to eat straight from a food's packaging, such as from a bag or box. Eating straight from the package makes it hard to see how much you are eating and can lead to overeating. Put the amount you would like to eat in a cup or on a plate to make sure you are eating the right portion. Use smaller plates, glasses, and bowls for smaller portions and to prevent  overeating. Try not to multitask. For example, avoid watching TV or using your computer while eating. If it is time to eat, sit down at a table and enjoy your food. This will help you recognize when you are full. It will also help you be more mindful of what and how much you are eating. What are tips for following this plan? Reading food labels Check the calorie count compared with the serving size. The serving size may be smaller than what you are used to eating. Check the source of the calories. Try to choose foods that are high in protein, fiber, and vitamins, and low in saturated fat, trans fat, and sodium. Shopping Read nutrition labels while you shop. This will help you make healthy decisions about which foods to buy. Pay attention to nutrition labels for low-fat or fat-free foods. These foods sometimes have the same number of calories or more calories than the full-fat versions. They also often have added sugar, starch, or salt to make up for flavor that was removed with the fat. Make a grocery list of lower-calorie foods and stick to it. Cooking Try to cook your favorite foods in a healthier way. For example, try baking instead of frying. Use low-fat dairy products. Meal planning Use more fruits and vegetables. One-half of your plate should be fruits and vegetables. Include lean proteins, such as chicken, turkey, and fish. Lifestyle Each week, aim to do one of the following: 150 minutes of moderate exercise, such as walking. 75 minutes of vigorous exercise, such as running. General information Know how many calories are in the foods you eat most often. This will help you calculate calorie counts faster. Find a way of tracking calories that works for you. Get creative. Try different apps or programs if writing down calories does not work for you. What foods should I eat?  Eat nutritious foods. It is better to have a nutritious, high-calorie food, such as an avocado, than a food with  few nutrients, such as a bag of potato chips. Use your calories on foods and drinks that will fill you up and will not leave you hungry soon after eating. Examples of foods that fill you up are nuts and nut butters, vegetables, lean proteins, and high-fiber foods such as whole grains. High-fiber foods are foods with more than 5 g of fiber per serving. Pay attention to calories in drinks. Low-calorie drinks include water and unsweetened drinks. The items listed above may not be a complete list of foods and beverages you can eat. Contact a dietitian for more information. What foods should I limit? Limit foods or drinks that are not good sources of vitamins, minerals, or protein or that are high in unhealthy fats. These   include: Candy. Other sweets. Sodas, specialty coffee drinks, alcohol, and juice. The items listed above may not be a complete list of foods and beverages you should avoid. Contact a dietitian for more information. How do I count calories when eating out? Pay attention to portions. Often, portions are much larger when eating out. Try these tips to keep portions smaller: Consider sharing a meal instead of getting your own. If you get your own meal, eat only half of it. Before you start eating, ask for a container and put half of your meal into it. When available, consider ordering smaller portions from the menu instead of full portions. Pay attention to your food and drink choices. Knowing the way food is cooked and what is included with the meal can help you eat fewer calories. If calories are listed on the menu, choose the lower-calorie options. Choose dishes that include vegetables, fruits, whole grains, low-fat dairy products, and lean proteins. Choose items that are boiled, broiled, grilled, or steamed. Avoid items that are buttered, battered, fried, or served with cream sauce. Items labeled as crispy are usually fried, unless stated otherwise. Choose water, low-fat milk,  unsweetened iced tea, or other drinks without added sugar. If you want an alcoholic beverage, choose a lower-calorie option, such as a glass of wine or light beer. Ask for dressings, sauces, and syrups on the side. These are usually high in calories, so you should limit the amount you eat. If you want a salad, choose a garden salad and ask for grilled meats. Avoid extra toppings such as bacon, cheese, or fried items. Ask for the dressing on the side, or ask for olive oil and vinegar or lemon to use as dressing. Estimate how many servings of a food you are given. Knowing serving sizes will help you be aware of how much food you are eating at restaurants. Where to find more information Centers for Disease Control and Prevention: http://www.wolf.info/ [place] Department of Agriculture: http://www.wilson-mendoza.org/ Summary Calorie counting means keeping track of how many calories you eat and drink each day. If you eat fewer calories than your body needs, you should lose weight. A healthy amount of weight to lose per week is usually 1-2 lb (0.5-0.9 kg). This usually means reducing your daily calorie intake by 500-750 calories. The number of calories in a food can be found on a Nutrition Facts label. If a food does not have a Nutrition Facts label, try to look up the calories online or ask your dietitian for help. Use smaller plates, glasses, and bowls for smaller portions and to prevent overeating. Use your calories on foods and drinks that will fill you up and not leave you hungry shortly after a meal. This information is not intended to replace advice given to you by your health care provider. Make sure you discuss any questions you have with your health care provider. Document Revised: 06/27/2019 Document Reviewed: 06/27/2019 Elsevier Patient Education  [place] Eating a healthy diet is important for the health of your heart. A heart-healthy eating plan includes: Eating less unhealthy  fats. Eating more healthy fats. Eating less salt in your food. Salt is also called sodium. Making other changes in your diet. Talk with your doctor or a diet specialist (dietitian) to create an eating plan that is right for you. What is my plan? Your doctor may recommend an eating plan that includes: Total fat: ______% or less of total calories a day. Saturated fat: ______% or less of  total calories a day. Cholesterol: less than _________mg a day. Sodium: less than _________mg a day. What are tips for following this plan? Cooking Avoid frying your food. Try to bake, boil, grill, or broil it instead. You can also reduce fat by: Removing the skin from poultry. Removing all visible fats from meats. Steaming vegetables in water or broth. Meal planning  At meals, divide your plate into four equal parts: Fill one-half of your plate with vegetables and green salads. Fill one-fourth of your plate with whole grains. Fill one-fourth of your plate with lean protein foods. Eat 2-4 cups of vegetables per day. One cup of vegetables is: 1 cup (91 g) broccoli or cauliflower florets. 2 medium carrots. 1 large bell pepper. 1 large sweet potato. 1 large tomato. 1 medium white potato. 2 cups (150 g) raw leafy greens. Eat 1-2 cups of fruit per day. One cup of fruit is: 1 small apple 1 large banana 1 cup (237 g) mixed fruit, 1 large orange,  cup (82 g) dried fruit, 1 cup (240 mL) 100% fruit juice. Eat more foods that have soluble fiber. These are apples, broccoli, carrots, beans, peas, and barley. Try to get 20-30 g of fiber per day. Eat 4-5 servings of nuts, legumes, and seeds per week: 1 serving of dried beans or legumes equals  cup (90 g) cooked. 1 serving of nuts is  oz (12 almonds, 24 pistachios, or 7 walnut halves). 1 serving of seeds equals  oz (8 g). General information Eat more home-cooked food. Eat less restaurant, buffet, and fast food. Limit or avoid alcohol. Limit foods  that are high in starch and sugar. Avoid fried foods. Lose weight if you are overweight. Keep track of how much salt (sodium) you eat. This is important if you have high blood pressure. Ask your doctor to tell you more about this. Try to add vegetarian meals each week. Fats Choose healthy fats. These include olive oil and canola oil, flaxseeds, walnuts, almonds, and seeds. Eat more omega-3 fats. These include salmon, mackerel, sardines, tuna, flaxseed oil, and ground flaxseeds. Try to eat fish at least 2 times each week. Check food labels. Avoid foods with trans fats or high amounts of saturated fat. Limit saturated fats. These are often found in animal products, such as meats, butter, and cream. These are also found in plant foods, such as palm oil, palm kernel oil, and coconut oil. Avoid foods with partially hydrogenated oils in them. These have trans fats. Examples are stick margarine, some tub margarines, cookies, crackers, and other baked goods. What foods should I eat? Fruits All fresh, canned (in natural juice), or frozen fruits. Vegetables Fresh or frozen vegetables (raw, steamed, roasted, or grilled). Green salads. Grains Most grains. Choose whole wheat and whole grains most of the time. Rice and pasta, including brown rice and pastas made with whole wheat. Meats and other proteins Lean, well-trimmed beef, veal, pork, and lamb. Chicken and [place] without skin. All fish and shellfish. Wild duck, rabbit, pheasant, and venison. Egg whites or low-cholesterol egg substitutes. Dried beans, peas, lentils, and tofu. Seeds and most nuts. Dairy Low-fat or nonfat cheeses, including ricotta and mozzarella. Skim or 1% milk that is liquid, powdered, or evaporated. Buttermilk that is made with low-fat milk. Nonfat or low-fat yogurt. Fats and oils Non-hydrogenated (trans-free) margarines. Vegetable oils, including soybean, sesame, sunflower, olive, peanut, safflower, corn, canola, and cottonseed.  Salad dressings or mayonnaise made with a vegetable oil. Beverages Mineral water. Coffee and tea. Diet carbonated  beverages. Sweets and desserts Sherbet, gelatin, and fruit ice. Small amounts of dark chocolate. Limit all sweets and desserts. Seasonings and condiments All seasonings and condiments. The items listed above may not be a complete list of foods and drinks you can eat. Contact a dietitian for more options. What foods should I avoid? Fruits Canned fruit in heavy syrup. Fruit in cream or butter sauce. Fried fruit. Limit coconut. Vegetables Vegetables cooked in cheese, cream, or butter sauce. Fried vegetables. Grains Breads that are made with saturated or trans fats, oils, or whole milk. Croissants. Sweet rolls. Donuts. High-fat crackers, such as cheese crackers. Meats and other proteins Fatty meats, such as hot dogs, ribs, sausage, bacon, rib-eye roast or steak. High-fat deli meats, such as salami and bologna. Caviar. Domestic duck and goose. Organ meats, such as liver. Dairy Cream, sour cream, cream cheese, and creamed cottage cheese. Whole-milk cheeses. Whole or 2% milk that is liquid, evaporated, or condensed. Whole buttermilk. Cream sauce or high-fat cheese sauce. Yogurt that is made from whole milk. Fats and oils Meat fat, or shortening. Cocoa butter, hydrogenated oils, palm oil, coconut oil, palm kernel oil. Solid fats and shortenings, including bacon fat, salt pork, lard, and butter. Nondairy cream substitutes. Salad dressings with cheese or sour cream. Beverages Regular sodas and juice drinks with added sugar. Sweets and desserts Frosting. Pudding. Cookies. Cakes. Pies. Milk chocolate or white chocolate. Buttered syrups. Full-fat ice cream or ice cream drinks. The items listed above may not be a complete list of foods and drinks to avoid. Contact a dietitian for more information. Summary Heart-healthy meal planning includes eating less unhealthy fats, eating more healthy  fats, and making other changes in your diet. Eat a balanced diet. This includes fruits and vegetables, low-fat or nonfat dairy, lean protein, nuts and legumes, whole grains, and heart-healthy oils and fats. This information is not intended to replace advice given to you by your health care provider. Make sure you discuss any questions you have with your health care provider. Document Revised: 06/21/2021 Document Reviewed: 06/21/2021 Elsevier Patient Education  [place] Many factors influence your heart health, including eating and exercise habits. Heart health is also called coronary health. Coronary risk increases with abnormal blood fat (lipid) levels. A heart-healthy eating plan includes limiting unhealthy fats, increasing healthy fats, limiting salt (sodium) intake, and making other diet and lifestyle changes. What is my plan? Your health care provider may recommend that: You limit your fat intake to _________% or less of your total calories each day. You limit your saturated fat intake to _________% or less of your total calories each day. You limit the amount of cholesterol in your diet to less than _________ mg per day. You limit the amount of sodium in your diet to less than _________ mg per day. What are tips for following this plan? Cooking Cook foods using methods other than frying. Baking, boiling, grilling, and broiling are all good options. Other ways to reduce fat include: Removing the skin from poultry. Removing all visible fats from meats. Steaming vegetables in water or broth. Meal planning  At meals, imagine dividing your plate into fourths: Fill one-half of your plate with vegetables and green salads. Fill one-fourth of your plate with whole grains. Fill one-fourth of your plate with lean protein foods. Eat 2-4 cups of vegetables per day. One cup of vegetables equals 1 cup (91 g) broccoli or cauliflower florets, 2 medium carrots, 1  large bell pepper, 1 large sweet  potato, 1 large tomato, 1 medium white potato, 2 cups (150 g) raw leafy greens. Eat 1-2 cups of fruit per day. One cup of fruit equals 1 small apple, 1 large banana, 1 cup (237 g) mixed fruit, 1 large orange,  cup (82 g) dried fruit, 1 cup (240 mL) 100% fruit juice. Eat more foods that contain soluble fiber. Examples include apples, broccoli, carrots, beans, peas, and barley. Aim to get 25-30 g of fiber per day. Increase your consumption of legumes, nuts, and seeds to 4-5 servings per week. One serving of dried beans or legumes equals  cup (90 g) cooked, 1 serving of nuts is  oz (12 almonds, 24 pistachios, or 7 walnut halves), and 1 serving of seeds equals  oz (8 g). Fats Choose healthy fats more often. Choose monounsaturated and polyunsaturated fats, such as olive and canola oils, avocado oil, flaxseeds, walnuts, almonds, and seeds. Eat more omega-3 fats. Choose salmon, mackerel, sardines, tuna, flaxseed oil, and ground flaxseeds. Aim to eat fish at least 2 times each week. Check food labels carefully to identify foods with trans fats or high amounts of saturated fat. Limit saturated fats. These are found in animal products, such as meats, butter, and cream. Plant sources of saturated fats include palm oil, palm kernel oil, and coconut oil. Avoid foods with partially hydrogenated oils in them. These contain trans fats. Examples are stick margarine, some tub margarines, cookies, crackers, and other baked goods. Avoid fried foods. General information Eat more home-cooked food and less restaurant, buffet, and fast food. Limit or avoid alcohol. Limit foods that are high in added sugar and simple starches such as foods made using white refined flour (white breads, pastries, sweets). Lose weight if you are overweight. Losing just 5-10% of your body weight can help your overall health and prevent diseases such as diabetes and heart disease. Monitor your sodium intake,  especially if you have high blood pressure. Talk with your health care provider about your sodium intake. Try to incorporate more vegetarian meals weekly. What foods should I eat? Fruits All fresh, canned (in natural juice), or frozen fruits. Vegetables Fresh or frozen vegetables (raw, steamed, roasted, or grilled). Green salads. Grains Most grains. Choose whole wheat and whole grains most of the time. Rice and pasta, including brown rice and pastas made with whole wheat. Meats and other proteins Lean, well-trimmed beef, veal, pork, and lamb. Chicken and [place] without skin. All fish and shellfish. Wild duck, rabbit, pheasant, and venison. Egg whites or low-cholesterol egg substitutes. Dried beans, peas, lentils, and tofu. Seeds and most nuts. Dairy Low-fat or nonfat cheeses, including ricotta and mozzarella. Skim or 1% milk (liquid, powdered, or evaporated). Buttermilk made with low-fat milk. Nonfat or low-fat yogurt. Fats and oils Non-hydrogenated (trans-free) margarines. Vegetable oils, including soybean, sesame, sunflower, olive, avocado, peanut, safflower, corn, canola, and cottonseed. Salad dressings or mayonnaise made with a vegetable oil. Beverages Water (mineral or sparkling). Coffee and tea. Unsweetened ice tea. Diet beverages. Sweets and desserts Sherbet, gelatin, and fruit ice. Small amounts of dark chocolate. Limit all sweets and desserts. Seasonings and condiments All seasonings and condiments. The items listed above may not be a complete list of foods and beverages you can eat. Contact a dietitian for more options. What foods should I avoid? Fruits Canned fruit in heavy syrup. Fruit in cream or butter sauce. Fried fruit. Limit coconut. Vegetables Vegetables cooked in cheese, cream, or butter sauce. Fried vegetables. Grains Breads made with saturated or trans fats, oils, or  whole milk. Croissants. Sweet rolls. Donuts. High-fat crackers, such as cheese crackers and  chips. Meats and other proteins Fatty meats, such as hot dogs, ribs, sausage, bacon, rib-eye roast or steak. High-fat deli meats, such as salami and bologna. Caviar. Domestic duck and goose. Organ meats, such as liver. Dairy Cream, sour cream, cream cheese, and creamed cottage cheese. Whole-milk cheeses. Whole or 2% milk (liquid, evaporated, or condensed). Whole buttermilk. Cream sauce or high-fat cheese sauce. Whole-milk yogurt. Fats and oils Meat fat, or shortening. Cocoa butter, hydrogenated oils, palm oil, coconut oil, palm kernel oil. Solid fats and shortenings, including bacon fat, salt pork, lard, and butter. Nondairy cream substitutes. Salad dressings with cheese or sour cream. Beverages Regular sodas and any drinks with added sugar. Sweets and desserts Frosting. Pudding. Cookies. Cakes. Pies. Milk chocolate or white chocolate. Buttered syrups. Full-fat ice cream or ice cream drinks. The items listed above may not be a complete list of foods and beverages to avoid. Contact a dietitian for more information. Summary Heart-healthy meal planning includes limiting unhealthy fats, increasing healthy fats, limiting salt (sodium) intake and making other diet and lifestyle changes. Lose weight if you are overweight. Losing just 5-10% of your body weight can help your overall health and prevent diseases such as diabetes and heart disease. Focus on eating a balance of foods, including fruits and vegetables, low-fat or nonfat dairy, lean protein, nuts and legumes, whole grains, and heart-healthy oils and fats. This information is not intended to replace advice given to you by your health care provider. Make sure you discuss any questions you have with your health care provider. Document Revised: 06/21/2021 Document Reviewed: 06/21/2021 Elsevier Patient Education  [place].

## 2022-07-21 NOTE — Progress Notes (Signed)
Established Patient Office Visit  Subjective   Patient ID: Richard Osborn, male    DOB: [birth year]  Age: 47 y.o. MRN: [id number]  Chief Complaint  Patient presents with   Follow-up    Labs drawn 06/23/22    HPI Richard Osborn  is a 47 y/o male who has history of alcohol dependency, DVT presents for lab review and medication refill. He states that he's compliant with his medications and denies side effects. He has a history of DVT and takes 20 mg of Xarelto, he denies hematuria, hematochezia and active bleeding. He is still at the [facility] for rehabilitation due to alcohol dependency which he states has going well. He continues to experience difficulty falling and staying asleep. He states that he wakes up several times times, he also states that his legs are restless which he noticed after he stopped drinking and it happens occasionally. He continues to use his CPAP machine every night. He also states he has been working on his weight and has lost 11lbs since his initial visit. Overall, he states that he's doing well and offers no further complaint.  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   Morbid obesity due to excess calories ([place]) 06/26/2015   OSA (obstructive sleep apnea) 09/23/2014   Past Medical History:  Diagnosis Date   Depressed    DVT (deep venous thrombosis) ([place]) 01/2022   right leg   History of pulmonary embolism 2020   bilateral   Sleep apnea    Substance abuse ([place])    Alcohol   Past Surgical History:  Procedure  Laterality Date   arm surgery      HEEL SPUR SURGERY     ~2008   NASAL SEPTUM SURGERY     ORIF ULNAR / RADIAL SHAFT FRACTURE     Social History   Tobacco Use   Smoking status: Former    Packs/day: 1.00    Years: 20.00    Total pack years: 20.00    Types: Cigarettes    Quit date: 05/2022    Years since quitting: 0.1   Smokeless tobacco: Never   Tobacco comments:    Patient resides at [facility] since 06/09/22. Using started using Nicotine patches 06/22/22  Vaping Use   Vaping Use: Never used  Substance Use Topics   Alcohol use: Not Currently    Comment: 1/5th of vodka daily, last use 03/15/22   Drug use: Not Currently    Types: Cocaine, Marijuana    Comment: last use ~2013   Family History  Problem Relation Age of Onset   Healthy Mother    Healthy Father    Diabetes Maternal Grandmother    Alzheimer's disease Maternal Grandmother    Other Maternal Grandfather        unknown medical history   Emphysema Paternal Grandmother    Other Paternal Grandfather        unknown medical history   Diabetes Other        grandmother   No Known Allergies    Review of Systems  Constitutional:  Positive for weight loss (states he has lost weight 11lbs intentionally). Negative for chills and fever.  HENT:  Negative for congestion, ear discharge, ear pain, hearing loss, nosebleeds and tinnitus.   Eyes:  Negative for blurred vision, double vision, photophobia and pain.  Respiratory: Negative.    Cardiovascular: Negative.   Gastrointestinal: Negative.   Genitourinary: Negative.   Musculoskeletal: Negative.   Skin:  Negative for itching and rash.  Neurological: Negative.   Psychiatric/Behavioral: Negative.        Objective:     BP 125/75 (BP Location: Left Arm, Patient Position: Sitting, Cuff Size: Large)   Pulse 76   Temp 97.7 F (36.5 C) (Oral)   Resp 16   Ht '5\' 9"'$  (1.753 m)   Wt (!) 304 lb 11.2 oz (138.2 kg)   SpO2 96%   BMI 45.00 kg/m  BP Readings from Last 3 Encounters:   07/21/22 125/75  06/23/22 128/81  03/02/22 123/76   Wt Readings from Last 3 Encounters:  07/21/22 (!) 304 lb 11.2 oz (138.2 kg)  06/23/22 (!) 315 lb 6.4 oz (143.1 kg)  03/02/22 (!) 331 lb (150.1 kg)    Encouraged to continue on his weight loss regimen.  Physical Exam Constitutional:      Appearance: Normal appearance. He is obese.  HENT:     Nose: Nose normal.     Mouth/Throat:     Mouth: Mucous membranes are moist.     Pharynx: Oropharynx is clear.  Eyes:     Extraocular Movements: Extraocular movements intact.     Conjunctiva/sclera: Conjunctivae normal.     Pupils: Pupils are equal, round, and reactive to light.  Cardiovascular:     Rate and Rhythm: Normal rate and regular rhythm.     Pulses: Normal pulses.     Heart sounds: Normal heart sounds.  Pulmonary:     Effort: Pulmonary effort is normal.     Breath sounds: Normal breath sounds.  Abdominal:     General: Abdomen is flat. Bowel sounds are normal.     Palpations: Abdomen is soft.  Musculoskeletal:        General: Normal range of motion.     Cervical back: Normal range of motion.  Skin:    General: Skin is warm and dry.     Capillary Refill: Capillary refill takes less than 2 seconds.  Neurological:     General: No focal deficit present.     Mental Status: He is alert and oriented to person, place, and time. Mental status is at baseline.  Psychiatric:        Mood and Affect: Mood normal.        Behavior: Behavior normal.        Thought Content: Thought content normal.        Judgment: Judgment normal.      No results found for any visits on 07/21/22.  Last CBC Lab Results  Component Value Date   WBC 7.7 06/23/2022   HGB 16.3 06/23/2022   HCT 48.4 06/23/2022   MCV 94 06/23/2022   MCH 31.8 06/23/2022   RDW 11.2 (L) 06/23/2022   PLT 201 [id number]   Last metabolic panel Lab Results  Component Value Date   GLUCOSE 90 06/23/2022   NA 139 06/23/2022   K 4.9 06/23/2022   CL 104 06/23/2022   CO2  20 06/23/2022   BUN 13 06/23/2022   CREATININE 0.98 06/23/2022   EGFR 96 06/23/2022   CALCIUM 9.4 06/23/2022  PROT 6.8 06/23/2022   ALBUMIN 4.2 06/23/2022   LABGLOB 2.6 06/23/2022   AGRATIO 1.6 06/23/2022   BILITOT 1.2 06/23/2022   ALKPHOS 59 06/23/2022   AST 27 06/23/2022   ALT 28 06/23/2022   ANIONGAP 13 02/21/2022   Last lipids Lab Results  Component Value Date   CHOL 178 06/23/2022   HDL 49 06/23/2022   LDLCALC 115 (H) 06/23/2022   TRIG 73 06/23/2022   CHOLHDL 3.6 06/23/2022   Last hemoglobin A1c Lab Results  Component Value Date   HGBA1C 5.1 06/23/2022   Last thyroid functions Lab Results  Component Value Date   TSH 5.811 (H) 09/16/2020   Last vitamin D No results found for: "25OHVITD2", "25OHVITD3", "VD25OH" Last vitamin B12 and Folate No results found for: "VITAMINB12", "FOLATE"    The 10-year ASCVD risk score (Arnett DK, et al., 2019) is: 6%    Assessment & Plan:  1. Acute deep vein thrombosis (DVT) of proximal vein of lower extremity, unspecified laterality ([place]) -He will continue  on Xarelto '20mg'$  daily. Denies any hematuria, hematochezia or abnormal bruising. Patient educated to monitor for any signs of bleeding and to go to the ED for worsening symptoms.  2. Insomnia, unspecified type He will continue using the CPAP every night.  - He was started on Melatonin, was educated on medication side effects and advised to notify clinic. He will [facility] Ms. Pruitt. Melatonin 3 MG CAPS; Take 1 capsule (3 mg total) by mouth at bedtime.  Dispense: 30 capsule; Refill: 0  3. Elevated lipids -The 10-year ASCVD risk score (Arnett DK, et al., 2019) is: 6%   Values used to calculate the score:     Age: 75 years     Sex: Male     Is Non-Hispanic African American: No     Diabetic: No     Tobacco smoker: Yes     Systolic Blood Pressure: 0000000 mmHg     Is BP treated: Yes     HDL Cholesterol: 49 mg/dL     Total Cholesterol: 178  mg/dL  His risk factor was 6%,  He was encouraged to continue on low fat/cholesterol diet and exercise as tolerated.  Return in about 3 months (around 10/19/2022), or if symptoms worsen or fail to improve.    Chioma Jerold Coombe, NP

## 2022-08-02 ENCOUNTER — Institutional Professional Consult (permissible substitution): Payer: Self-pay | Admitting: Licensed Clinical Social Worker

## 2022-08-09 ENCOUNTER — Institutional Professional Consult (permissible substitution): Payer: Self-pay | Admitting: Licensed Clinical Social Worker

## 2022-08-16 ENCOUNTER — Institutional Professional Consult (permissible substitution): Payer: Self-pay | Admitting: Licensed Clinical Social Worker

## 2022-09-01 IMAGING — CT CT CERVICAL SPINE W/O CM
3 of 4 series · 12 of 33 positions shown, 14 images · non-contrast
Comparison: Cervical spine radiographs 06/22/2020. Head and
cervical spine CTs 07/02/2011.

CLINICAL DATA: Fall with head and neck trauma. Possible
nondisplaced dens fracture on cervical spine radiographs.

EXAM:
CT HEAD WITHOUT CONTRAST
CT CERVICAL SPINE WITHOUT CONTRAST
TECHNIQUE: Multidetector CT imaging of the head and cervical spine was
performed following the standard protocol without intravenous
contrast. Multiplanar CT image reconstructions of the cervical spine
were also generated.

[Series 8: sag bone · sagittal · 0.25mm/px · 5 of 72 slices shown, 6 images]
[im 24/72  bone]
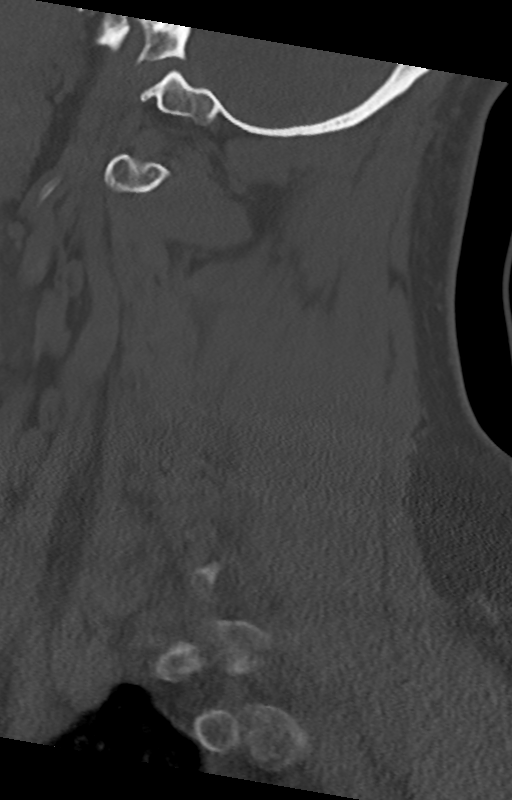
[im 30/72  bone]
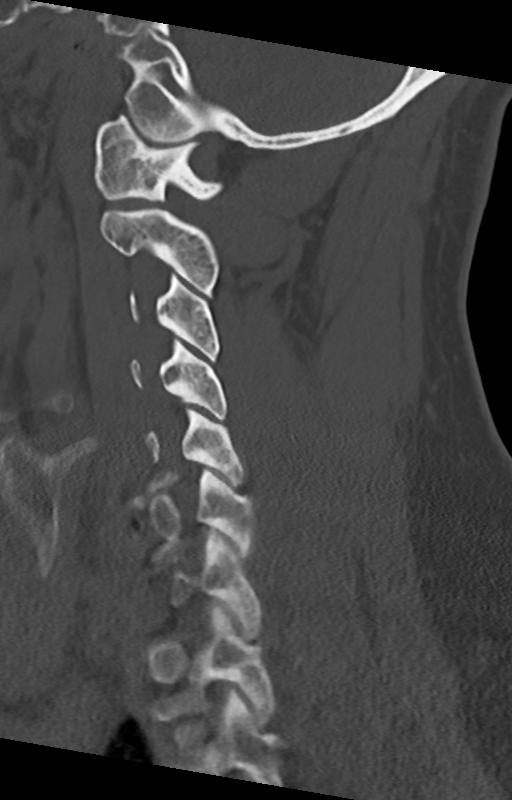
[im 36/72  soft-tissue]
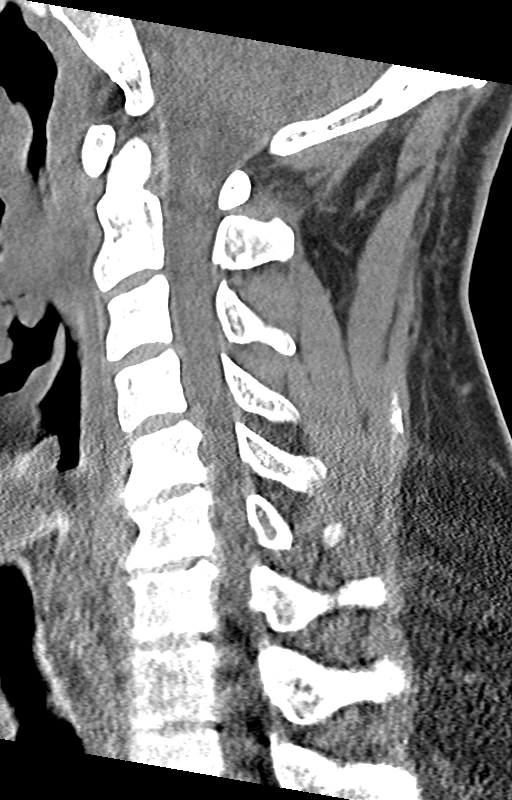
[im 36/72  bone]
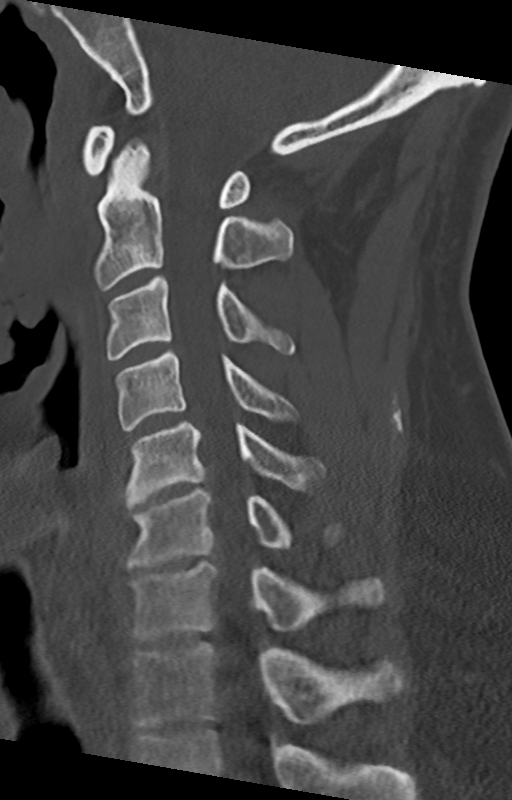
[im 42/72  bone]
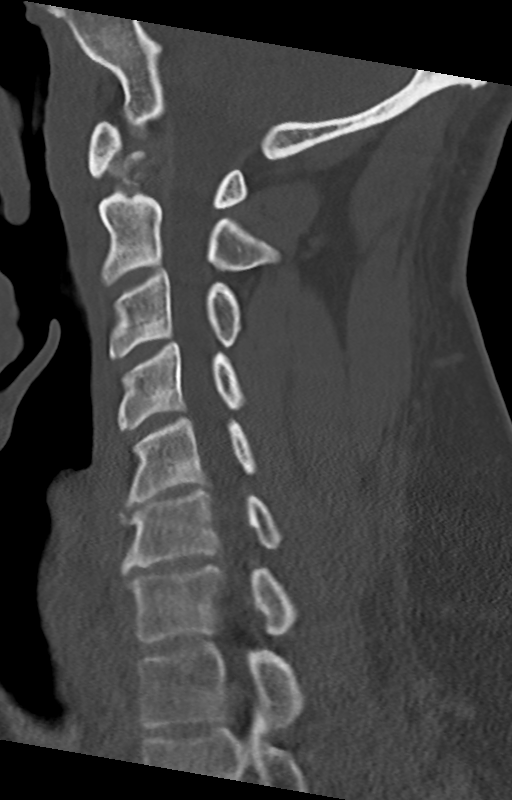
[im 48/72  bone]
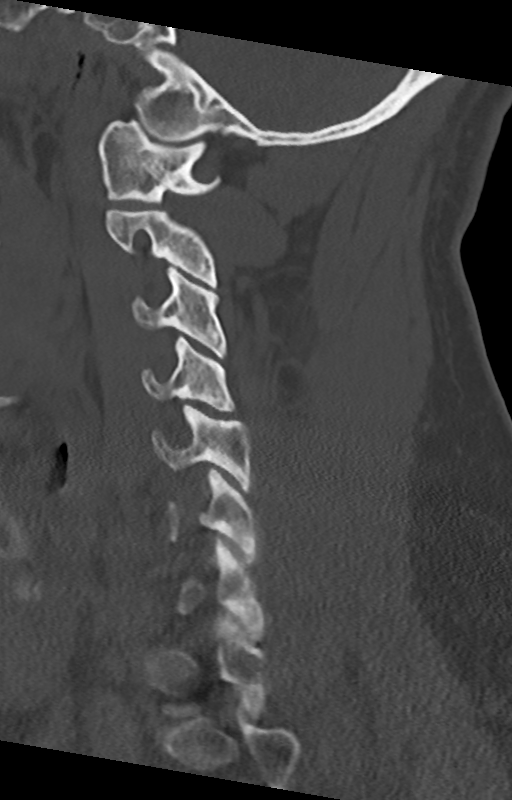

[Series 9: cor bone · coronal · 0.31mm/px · 3 of 67 slices shown]
[im 14/67  bone]
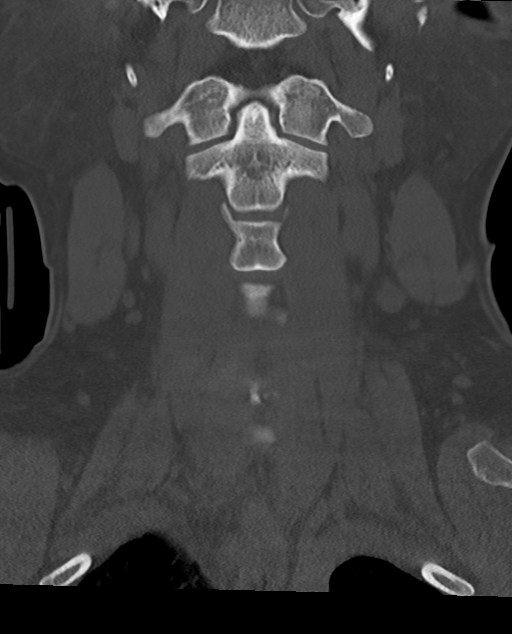
[im 27/67  bone]
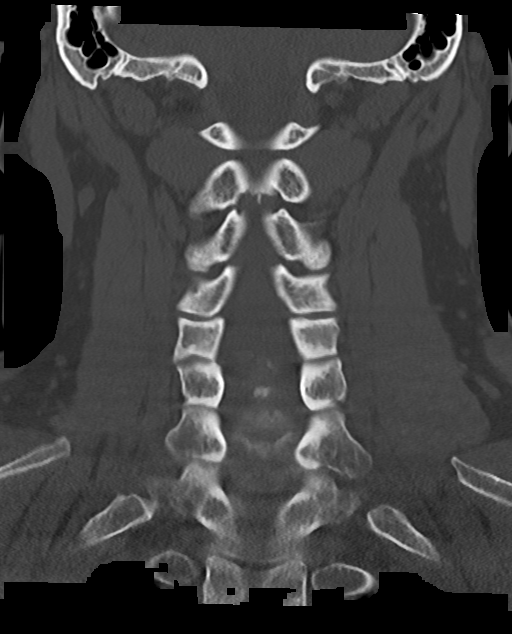
[im 40/67  bone]
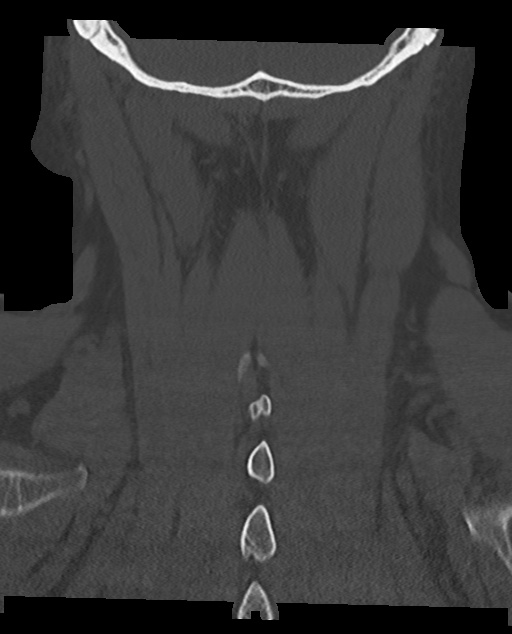

[Series 10: orthogonal axials · axial · 0.21mm/px · z∈[+1063,+1169]mm · 4 of 92 slices shown, 5 images]
[im 16/92  soft-tissue]
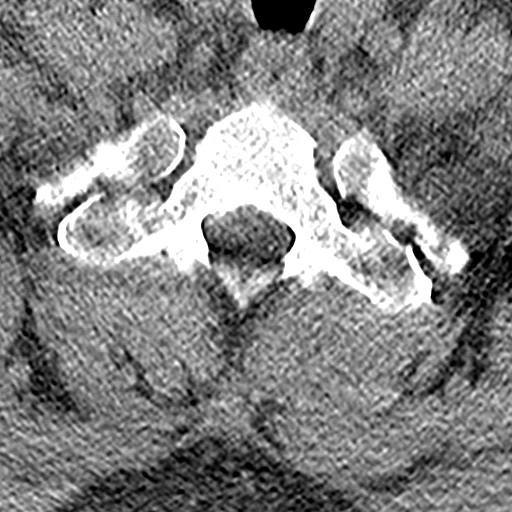
[im 16/92  bone]
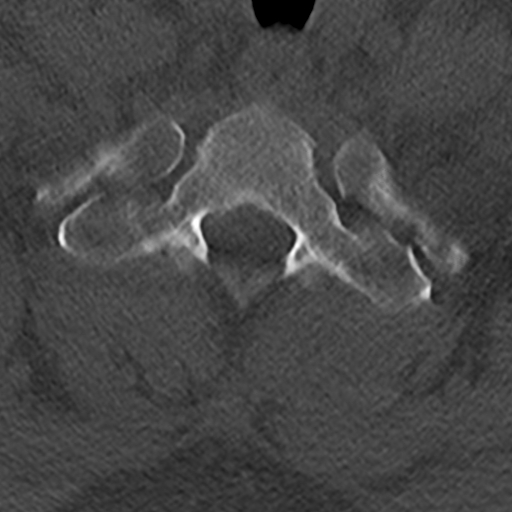
[im 31/92  bone]
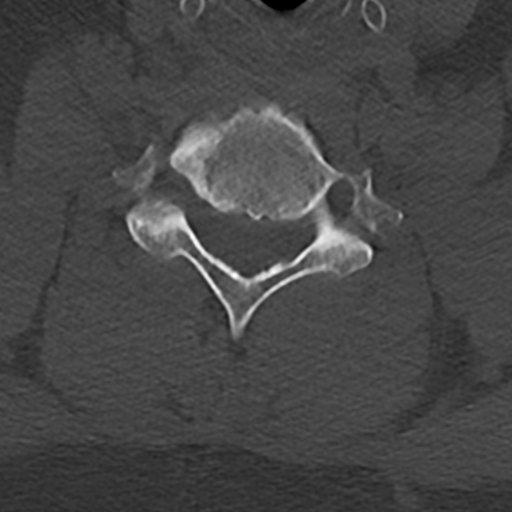
[im 61/92  bone]
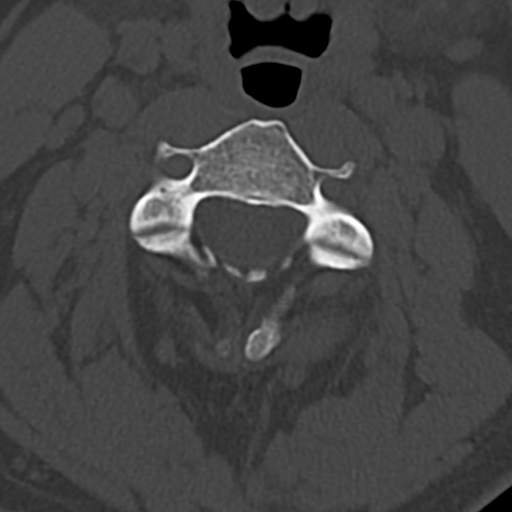
[im 76/92  bone]
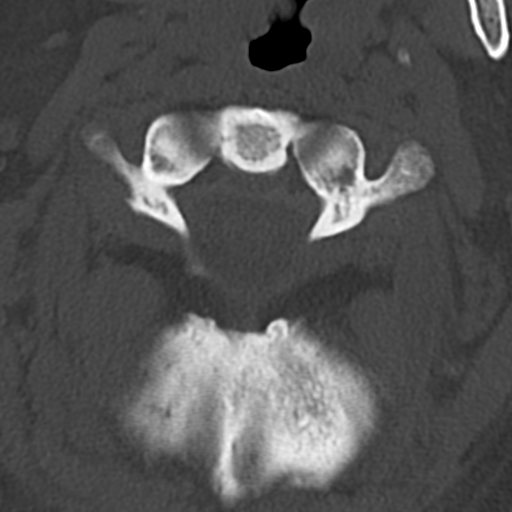

[12 of 33 positions shown; findings below may reference images not displayed]

FINDINGS: CT HEAD FINDINGS

Brain: There is no evidence of an acute infarct, intracranial
hemorrhage, mass, midline shift, or extra-axial fluid collection.
The ventricles and sulci are normal.

Vascular: No hyperdense vessel.

Skull: No fracture or suspicious osseous lesion.

Sinuses/Orbits: Paranasal sinuses and mastoid air cells are clear.
Unremarkable orbits.

Other: None.

CT CERVICAL SPINE FINDINGS

Alignment: Mild chronic reversal of the normal cervical lordosis. No
listhesis.

Skull base and vertebrae: No fracture or suspicious osseous lesion.

Soft tissues and spinal canal: No prevertebral fluid or swelling. No
visible canal hematoma.

Disc levels: Mild disc space narrowing and endplate spurring at C5-6
and C6-7, progressed from 8959. Central disc osteophyte complex at
C5-6 results in moderate spinal stenosis. Asymmetric right
uncovertebral spurring at C5-6 results in moderate right neural
foraminal stenosis.

Upper chest: Clear lung apices.

Other: None.
IMPRESSION: 1. Negative head CT.
2. No evidence of acute fracture or subluxation in the cervical
spine.
3. Cervical spondylosis most notable at C5-6 where there is moderate
spinal stenosis and moderate right neural foraminal stenosis.

## 2022-09-01 IMAGING — CT CT HEAD W/O CM
4 series · 16 of 47 positions shown, 18 images · non-contrast
Comparison: Cervical spine radiographs 06/22/2020. Head and
cervical spine CTs 07/02/2011.

CLINICAL DATA: Fall with head and neck trauma. Possible
nondisplaced dens fracture on cervical spine radiographs.

EXAM:
CT HEAD WITHOUT CONTRAST
CT CERVICAL SPINE WITHOUT CONTRAST
TECHNIQUE: Multidetector CT imaging of the head and cervical spine was
performed following the standard protocol without intravenous
contrast. Multiplanar CT image reconstructions of the cervical spine
were also generated.

[Series 3: head wo · axial · 0.43mm/px · z∈[+1214,+1334]mm · 7 of 34 slices shown, 9 images]
[im 5/34  brain]
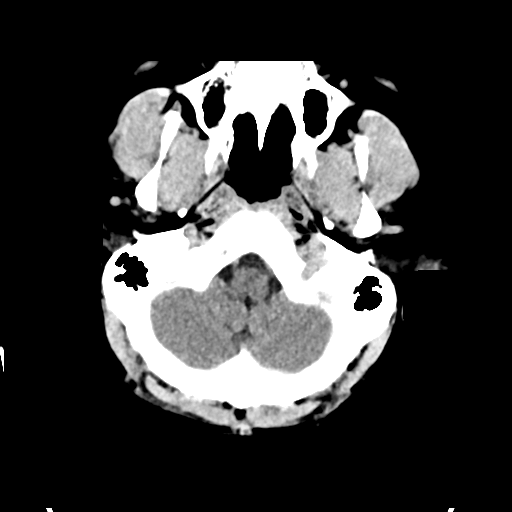
[im 5/34  bone]
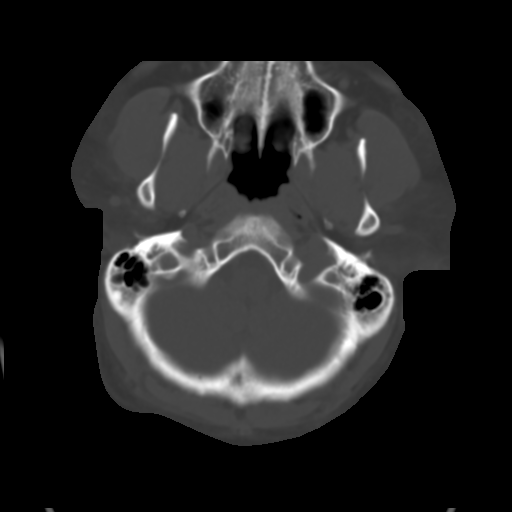
[im 9/34  brain]
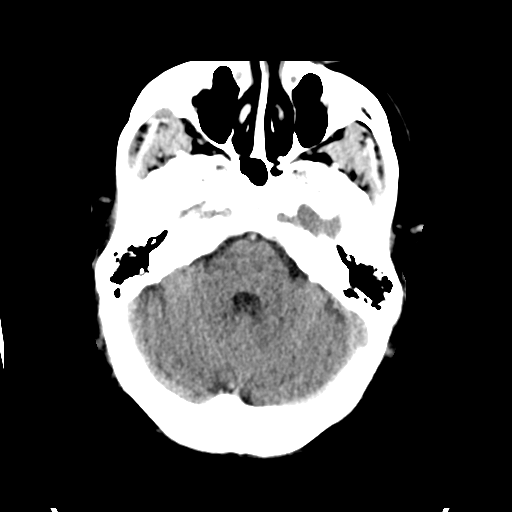
[im 13/34  brain]
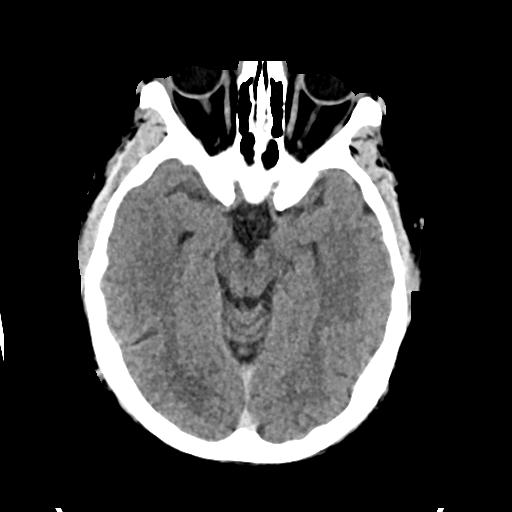
[im 17/34  brain]
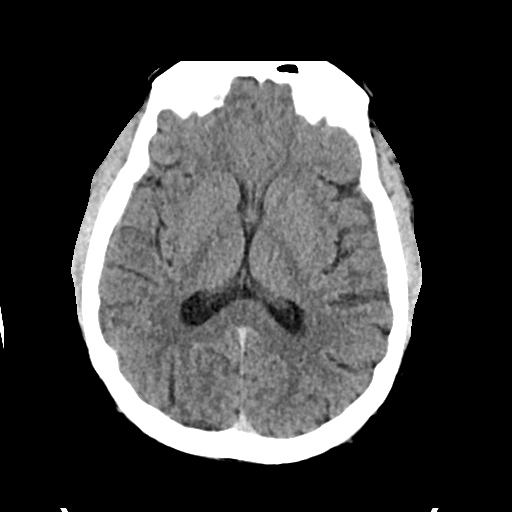
[im 21/34  brain]
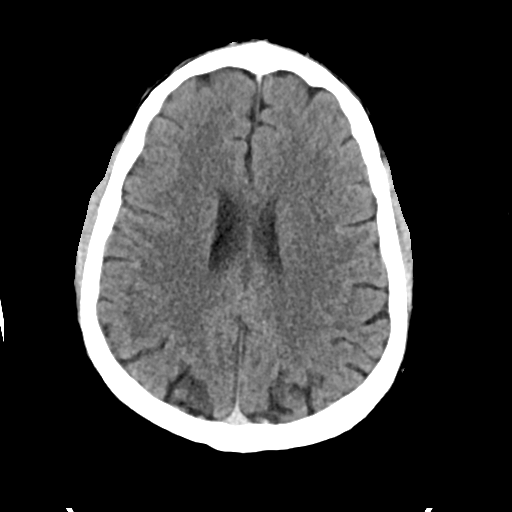
[im 21/34  bone]
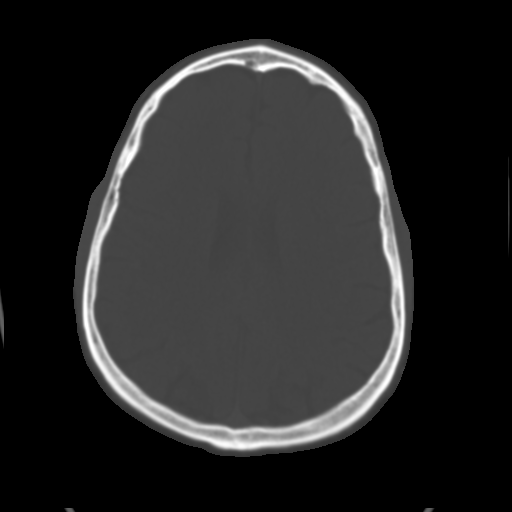
[im 25/34  brain]
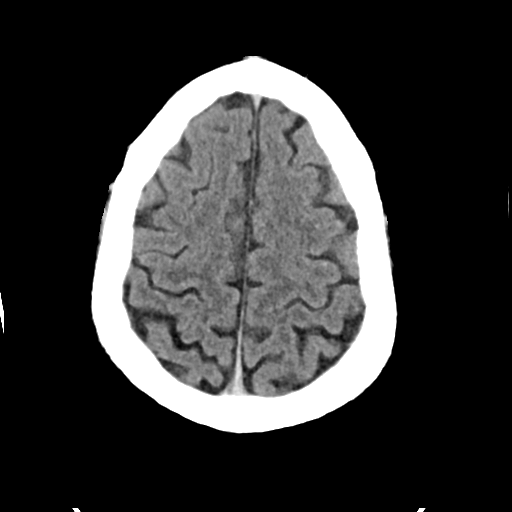
[im 29/34  brain]
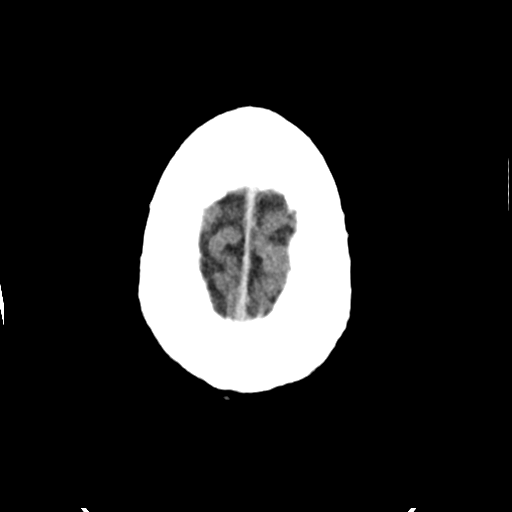

[Series 4: head bone · axial · 0.43mm/px · z∈[+1210,+1244]mm · 3 of 85 slices shown]
[im 9/85  bone]
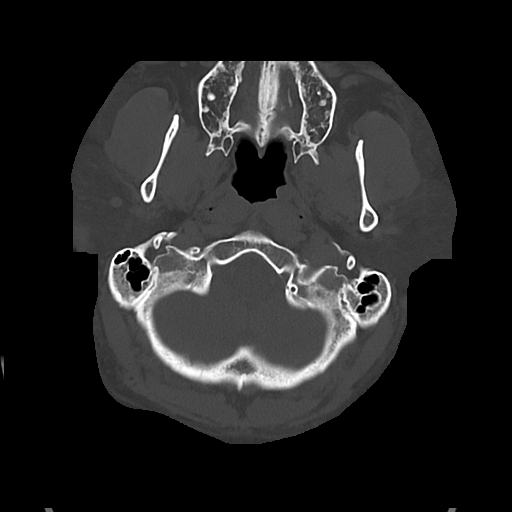
[im 17/85  bone]
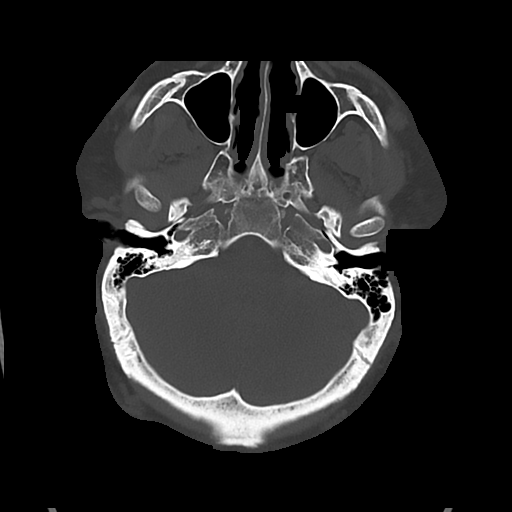
[im 26/85  bone]
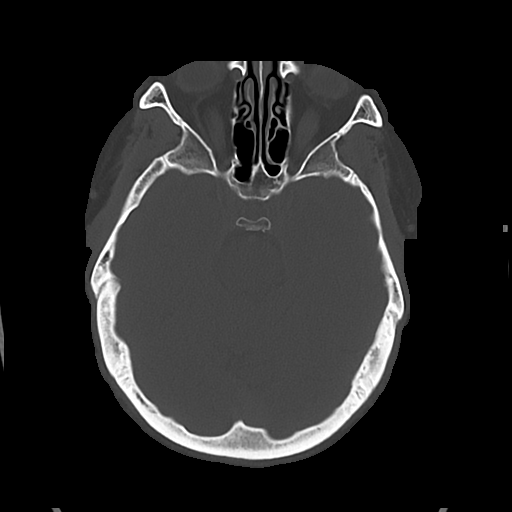

[Series 5: cor soft · coronal · 0.33mm/px · 3 of 73 slices shown]
[im 25/73  brain]
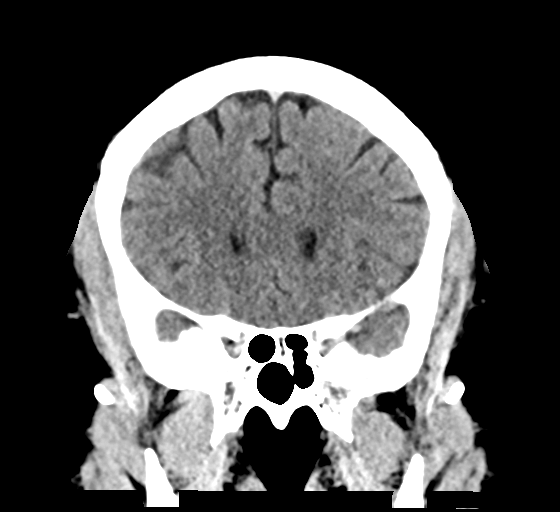
[im 33/73  brain]
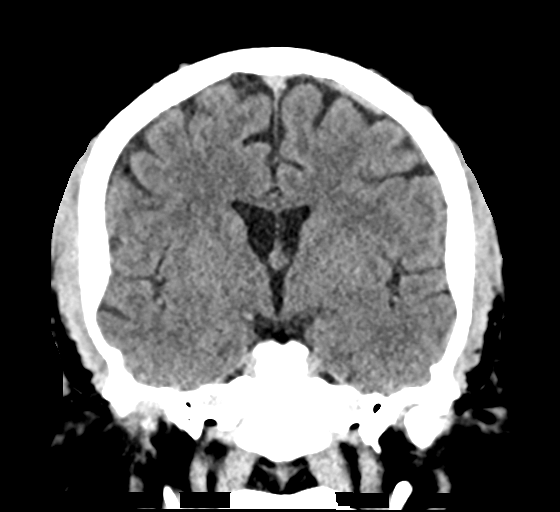
[im 41/73  brain]
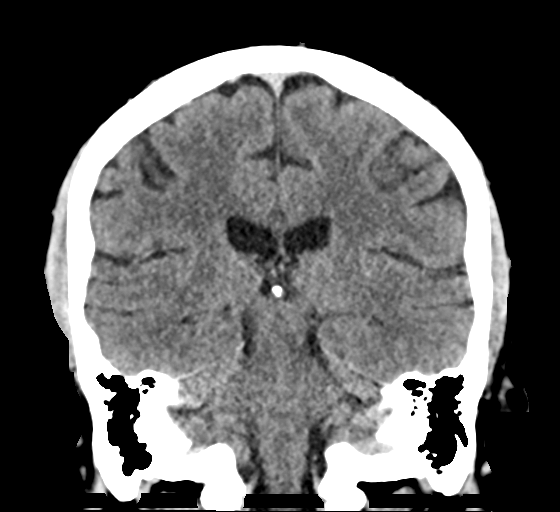

[Series 6: sag soft · sagittal · 0.33mm/px · 3 of 63 slices shown]
[im 21/63  brain]
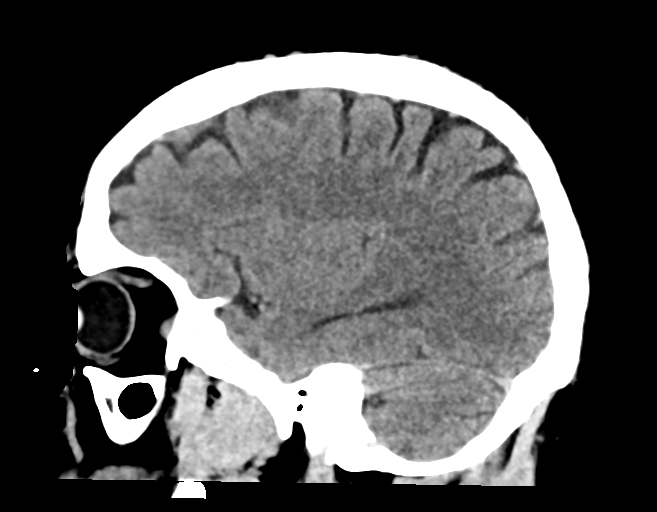
[im 32/63  brain]
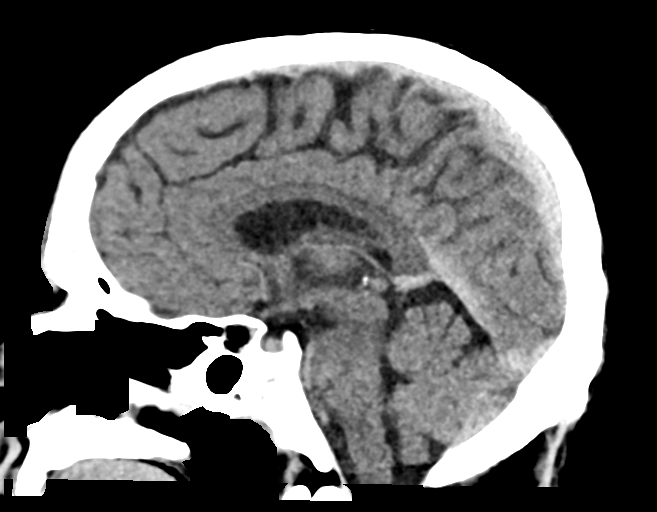
[im 42/63  brain]
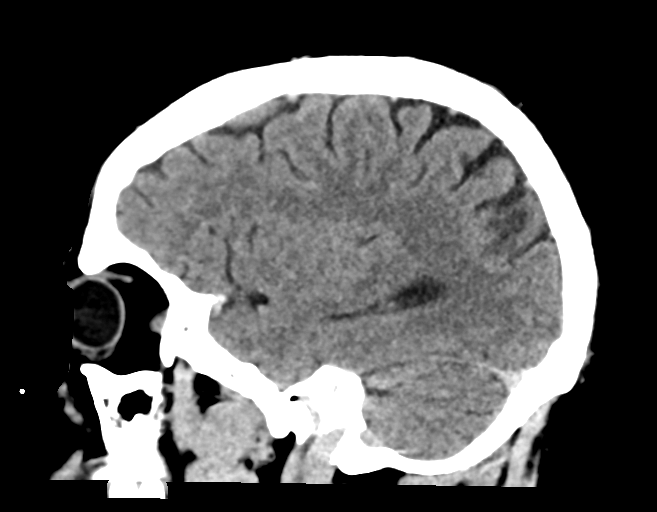

[16 of 47 positions shown; findings below may reference images not displayed]

FINDINGS: CT HEAD FINDINGS

Brain: There is no evidence of an acute infarct, intracranial
hemorrhage, mass, midline shift, or extra-axial fluid collection.
The ventricles and sulci are normal.

Vascular: No hyperdense vessel.

Skull: No fracture or suspicious osseous lesion.

Sinuses/Orbits: Paranasal sinuses and mastoid air cells are clear.
Unremarkable orbits.

Other: None.

CT CERVICAL SPINE FINDINGS

Alignment: Mild chronic reversal of the normal cervical lordosis. No
listhesis.

Skull base and vertebrae: No fracture or suspicious osseous lesion.

Soft tissues and spinal canal: No prevertebral fluid or swelling. No
visible canal hematoma.

Disc levels: Mild disc space narrowing and endplate spurring at C5-6
and C6-7, progressed from 8959. Central disc osteophyte complex at
C5-6 results in moderate spinal stenosis. Asymmetric right
uncovertebral spurring at C5-6 results in moderate right neural
foraminal stenosis.

Upper chest: Clear lung apices.

Other: None.
IMPRESSION: 1. Negative head CT.
2. No evidence of acute fracture or subluxation in the cervical
spine.
3. Cervical spondylosis most notable at C5-6 where there is moderate
spinal stenosis and moderate right neural foraminal stenosis.

## 2022-10-17 ENCOUNTER — Other Ambulatory Visit: Payer: Self-pay

## 2022-10-19 ENCOUNTER — Ambulatory Visit: Payer: Self-pay | Admitting: Gerontology

## 2022-11-02 ENCOUNTER — Encounter: Payer: Self-pay | Admitting: *Deleted

## 2022-11-07 ENCOUNTER — Other Ambulatory Visit: Payer: Self-pay

## 2022-11-07 ENCOUNTER — Emergency Department
Admission: EM | Admit: 2022-11-07 | Discharge: 2022-11-07 | Disposition: A | Discharge status: Home / Self Care | Payer: Self-pay | Attending: Emergency Medicine | Admitting: Emergency Medicine

## 2022-11-07 DIAGNOSIS — Y909 Presence of alcohol in blood, level not specified: Secondary | ICD-10-CM | POA: Insufficient documentation

## 2022-11-07 DIAGNOSIS — F1010 Alcohol abuse, uncomplicated: Secondary | ICD-10-CM | POA: Insufficient documentation

## 2022-11-07 NOTE — ED Provider Notes (Signed)
   [facility] Provider Note    Event Date/Time   First MD Initiated Contact with Patient 11/07/22 1456     (approximate)   History   Alcohol Problem   HPI  Richard Osborn is a 47 y.o. male with a history of alcohol abuse, reports that [place] [employer] presents with representative from [facility], he apparently went [facility] this morning but alcohol level was too high so was sent to the emergency department for reasons that are not entirely clear.  He feels well and has no complaints.  He is not jittery or suffering from withdrawal.     Physical Exam   Triage Vital Signs: ED Triage Vitals  Enc Vitals Group     BP 11/07/22 1455 106/70     Pulse Rate 11/07/22 1455 88     Resp 11/07/22 1455 20     Temp 11/07/22 1455 98.2 F (36.8 C)     Temp Source 11/07/22 1455 Oral     SpO2 11/07/22 1455 96 %     Weight 11/07/22 1455 133.8 kg (295 lb)     Height 11/07/22 1455 1.753 m (5\' 9" )     Head Circumference --      Peak Flow --      Pain Score 11/07/22 1458 0     Pain Loc --      Pain Edu? --      Excl. in GC? --     Most recent vital signs: Vitals:   11/07/22 1455  BP: 106/70  Pulse: 88  Resp: 20  Temp: 98.2 F (36.8 C)  SpO2: 96%     General: Awake, no distress.  CV:  Good peripheral perfusion.  Resp:  Normal effort.  Abd:  No distention.  Other:     ED Results / Procedures / Treatments   Labs (all labs ordered are listed, but only abnormal results are displayed) Labs Reviewed - No data to display   EKG     RADIOLOGY     PROCEDURES:  Critical Care performed:   Procedures   MEDICATIONS ORDERED IN ED: Medications - No data to display   IMPRESSION / MDM / ASSESSMENT AND PLAN / ED COURSE  I reviewed the triage vital signs and the nursing notes. Patient's presentation is most consistent with exacerbation of chronic illness.  Patient with long history of alcohol abuse, recent recurrence, admits to drinking significant  alcohol this morning prior to going to [facility].  He has no physical complaints, no evidence of withdrawal.  Well-appearing and in no acute distress, does not want to be in the emergency department, has no psychiatric complaints.  No indication for further evaluation at this time, recommend follow-up with [facility].        FINAL CLINICAL IMPRESSION(S) / ED DIAGNOSES   Final diagnoses:  Alcohol abuse     Rx / DC Orders   ED Discharge Orders     None        Note:  This document was prepared using Dragon voice recognition software and may include unintentional dictation errors.   Jene Every, MD 11/07/22 [phone]

## 2022-11-07 NOTE — ED Triage Notes (Signed)
Pt arrives to triage via POV after attempting to get into a rehab, went there this am but his alcohol level was too high.   Dr Cyril Loosen to triage room

## 2022-11-19 ENCOUNTER — Emergency Department
Admission: EM | Admit: 2022-11-19 | Discharge: 2022-11-19 | Disposition: A | Discharge status: Home / Self Care | Payer: Self-pay | Attending: Emergency Medicine | Admitting: Emergency Medicine

## 2022-11-19 ENCOUNTER — Other Ambulatory Visit: Payer: Self-pay

## 2022-11-19 DIAGNOSIS — F10920 Alcohol use, unspecified with intoxication, uncomplicated: Secondary | ICD-10-CM

## 2022-11-19 DIAGNOSIS — F10129 Alcohol abuse with intoxication, unspecified: Secondary | ICD-10-CM | POA: Insufficient documentation

## 2022-11-19 DIAGNOSIS — Y908 Blood alcohol level of 240 mg/100 ml or more: Secondary | ICD-10-CM | POA: Insufficient documentation

## 2022-11-19 DIAGNOSIS — N189 Chronic kidney disease, unspecified: Secondary | ICD-10-CM | POA: Insufficient documentation

## 2022-11-19 LAB — COMPREHENSIVE METABOLIC PANEL
ALT: 23 U/L (ref 0–44)
AST: 24 U/L (ref 15–41)
Albumin: 3.5 g/dL (ref 3.5–5.0)
Alkaline Phosphatase: 64 U/L (ref 38–126)
Anion gap: 8 (ref 5–15)
BUN: 10 mg/dL (ref 6–20)
CO2: 20 mmol/L — ABNORMAL LOW (ref 22–32)
Calcium: 8.2 mg/dL — ABNORMAL LOW (ref 8.9–10.3)
Chloride: 111 mmol/L (ref 98–111)
Creatinine, Ser: 1.03 mg/dL (ref 0.61–1.24)
GFR, Estimated: >60 mL/min (ref >60)
Glucose, Bld: 88 mg/dL (ref 70–99)
Potassium: 3.3 mmol/L — ABNORMAL LOW (ref 3.5–5.1)
Sodium: 139 mmol/L (ref 135–145)
Total Bilirubin: 0.7 mg/dL (ref 0.3–1.2)
Total Protein: 7.1 g/dL (ref 6.5–8.1)

## 2022-11-19 LAB — CBC
HCT: 50.1 % (ref 39.0–52.0)
Hemoglobin: 17 g/dL (ref 13.0–17.0)
MCH: 31.6 pg (ref 26.0–34.0)
MCHC: 33.9 g/dL (ref 30.0–36.0)
MCV: 93.1 fL (ref 80.0–100.0)
Platelets: 184 10*3/uL (ref 150–400)
RBC: 5.38 MIL/uL (ref 4.22–5.81)
RDW: 13 % (ref 11.5–15.5)
WBC: 6.8 10*3/uL (ref 4.0–10.5)
nRBC: 0 % (ref 0.0–0.2)

## 2022-11-19 LAB — URINE DRUG SCREEN, QUALITATIVE (ARMC ONLY)
Amphetamines, Ur Screen: NOT DETECTED
Barbiturates, Ur Screen: NOT DETECTED
Benzodiazepine, Ur Scrn: POSITIVE — AB
Cannabinoid 50 Ng, Ur ~~LOC~~: NOT DETECTED
Cocaine Metabolite,Ur ~~LOC~~: NOT DETECTED
MDMA (Ecstasy)Ur Screen: NOT DETECTED
Methadone Scn, Ur: NOT DETECTED
Opiate, Ur Screen: NOT DETECTED
Phencyclidine (PCP) Ur S: NOT DETECTED
Tricyclic, Ur Screen: NOT DETECTED

## 2022-11-19 LAB — ETHANOL: Alcohol, Ethyl (B): 287 mg/dL — ABNORMAL HIGH (ref <10)

## 2022-11-19 MED ORDER — AMLODIPINE BESYLATE 5 MG PO TABS
5.0000 mg | ORAL_TABLET | Freq: Every day | ORAL | 1 refills | Status: DC
  Filled 2022-11-19: qty 30, 30d supply, fill #0

## 2022-11-19 NOTE — ED Notes (Signed)
Pt continues to sleep with unlabored respirations.

## 2022-11-19 NOTE — Discharge Instructions (Addendum)
For help with substance use and mental health services, you can contact [facility] at [phone]. Harvey Bryant is a representative of [facility] and you may contact him directly at [phone] 

## 2022-11-19 NOTE — ED Notes (Signed)
Pt continues to sleep.

## 2022-11-19 NOTE — ED Provider Notes (Signed)
Patient clinically sober and has a safe ride home.  Stable for discharge.   Willy Eddy, MD 11/19/22 1011

## 2022-11-19 NOTE — ED Provider Notes (Signed)
[facility] Provider Note    Event Date/Time   First MD Initiated Contact with Patient 11/19/22 0522     (approximate)   History   Chief Complaint: Alcohol Intoxication   HPI  Richard Osborn is a 47 y.o. male with a history of CKD, alcoholism, pulmonary embolism who comes ED requesting alcohol detox.  Last drink was a few hours prior to arrival at the ED.  Reports that he drinks almost every day.  He has a history of tremulousness, but denies history of hallucinations or seizures.  Currently denies any symptoms.  No pain fever vomiting.  No falls or trauma.  He has been compliant with Eliquis for PE     Physical Exam   Triage Vital Signs: ED Triage Vitals [11/19/22 0108]  Enc Vitals Group     BP 104/68     Pulse Rate 72     Resp 20     Temp 98 F (36.7 C)     Temp Source Oral     SpO2 97 %     Weight 295 lb (133.8 kg)     Height 5\' 9"  (1.753 m)     Head Circumference      Peak Flow      Pain Score 5     Pain Loc      Pain Edu?      Excl. in GC?     Most recent vital signs: Vitals:   11/19/22 0523 11/19/22 0615  BP: 102/78 98/67  Pulse: 80   Resp: 20 16  Temp: 98 F (36.7 C)   SpO2: 99% 97%    General: Awake, no distress.  Mildly slurred speech. CV:  Good peripheral perfusion.  Regular rate rhythm Resp:  Normal effort.  Clear to auscultation bilaterally Abd:  No distention.  Soft nontender Other:  No lower extremity edema or calf tenderness   ED Results / Procedures / Treatments   Labs (all labs ordered are listed, but only abnormal results are displayed) Labs Reviewed  COMPREHENSIVE METABOLIC PANEL - Abnormal; Notable for the following components:      Result Value   Potassium 3.3 (*)    CO2 20 (*)    Calcium 8.2 (*)    All other components within normal limits  ETHANOL - Abnormal; Notable for the following components:   Alcohol, Ethyl (B) 287 (*)    All other components within normal limits  URINE DRUG SCREEN,  QUALITATIVE ([facility] ONLY) - Abnormal; Notable for the following components:   Benzodiazepine, Ur Scrn POSITIVE (*)    All other components within normal limits  CBC     EKG    RADIOLOGY    PROCEDURES:  Procedures   MEDICATIONS ORDERED IN ED: Medications - No data to display   IMPRESSION / MDM / ASSESSMENT AND PLAN / ED COURSE  I reviewed the triage vital signs and the nursing notes.  DDx: Alcohol intoxication, electrolyte abnormality, AKI, anemia  Patient's presentation is most consistent with acute presentation with potential threat to life or bodily function.  Patient presents with mild alcohol intoxication.  Labs unremarkable.  Vital signs normal.  No signs of withdrawal.  Will provide substance abuse treatment resources.  Stable for discharge when clinically sober.         FINAL CLINICAL IMPRESSION(S) / ED DIAGNOSES   Final diagnoses:  Alcoholic intoxication without complication (HCC)     Rx / DC Orders   ED Discharge Orders  Ordered    amLODipine (NORVASC) 5 MG tablet  Daily        11/19/22 0710             Note:  This document was prepared using Dragon voice recognition software and may include unintentional dictation errors.   Sharman Cheek, MD 11/19/22 [phone]

## 2022-11-19 NOTE — ED Triage Notes (Signed)
Pt to ED via POV c/o alcohol intoxication. Pt states he doesn't know how much he had to drink tonight. Reports drinking "1/5 a day". Denies SI, HI. Pt wanting detox. Denies CP, SOB, N/V

## 2022-11-19 NOTE — ED Notes (Signed)
Pt continues to snore in hall bed. BP not working on dinamap.

## 2022-11-19 NOTE — ED Notes (Signed)
Pt woke up and stated needs to use bathroom. Pt walked to hall bathroom with steady gait. Pt states he has a person he can call for ride home.

## 2022-11-19 NOTE — ED Notes (Signed)
250 ml of water given. Patient tolerated it well

## 2022-11-19 NOTE — ED Notes (Signed)
Pt snoring in hallway bed.

## 2022-11-20 ENCOUNTER — Other Ambulatory Visit: Payer: Self-pay

## 2022-11-20 ENCOUNTER — Emergency Department
Admission: EM | Admit: 2022-11-20 | Discharge: 2022-11-20 | Disposition: A | Discharge status: Home / Self Care | Payer: Self-pay | Attending: Emergency Medicine | Admitting: Emergency Medicine

## 2022-11-20 DIAGNOSIS — Y907 Blood alcohol level of 200-239 mg/100 ml: Secondary | ICD-10-CM | POA: Insufficient documentation

## 2022-11-20 DIAGNOSIS — E860 Dehydration: Secondary | ICD-10-CM | POA: Insufficient documentation

## 2022-11-20 DIAGNOSIS — F1010 Alcohol abuse, uncomplicated: Secondary | ICD-10-CM | POA: Insufficient documentation

## 2022-11-20 LAB — COMPREHENSIVE METABOLIC PANEL
ALT: 21 U/L (ref 0–44)
AST: 23 U/L (ref 15–41)
Albumin: 3.5 g/dL (ref 3.5–5.0)
Alkaline Phosphatase: 61 U/L (ref 38–126)
Anion gap: 9 (ref 5–15)
BUN: 13 mg/dL (ref 6–20)
CO2: 21 mmol/L — ABNORMAL LOW (ref 22–32)
Calcium: 8.2 mg/dL — ABNORMAL LOW (ref 8.9–10.3)
Chloride: 114 mmol/L — ABNORMAL HIGH (ref 98–111)
Creatinine, Ser: 0.96 mg/dL (ref 0.61–1.24)
GFR, Estimated: >60 mL/min (ref >60)
Glucose, Bld: 85 mg/dL (ref 70–99)
Potassium: 3.6 mmol/L (ref 3.5–5.1)
Sodium: 144 mmol/L (ref 135–145)
Total Bilirubin: 1 mg/dL (ref 0.3–1.2)
Total Protein: 7.1 g/dL (ref 6.5–8.1)

## 2022-11-20 LAB — CBC
HCT: 52.2 % — ABNORMAL HIGH (ref 39.0–52.0)
Hemoglobin: 17.6 g/dL — ABNORMAL HIGH (ref 13.0–17.0)
MCH: 31.4 pg (ref 26.0–34.0)
MCHC: 33.7 g/dL (ref 30.0–36.0)
MCV: 93.2 fL (ref 80.0–100.0)
Platelets: 219 10*3/uL (ref 150–400)
RBC: 5.6 MIL/uL (ref 4.22–5.81)
RDW: 12.9 % (ref 11.5–15.5)
WBC: 7.5 10*3/uL (ref 4.0–10.5)
nRBC: 0 % (ref 0.0–0.2)

## 2022-11-20 LAB — ETHANOL: Alcohol, Ethyl (B): 226 mg/dL — ABNORMAL HIGH (ref <10)

## 2022-11-20 MED ORDER — LACTATED RINGERS IV BOLUS
2000.0000 mL | Freq: Once | INTRAVENOUS | Status: AC
  Administered 2022-11-20: 2000 mL via INTRAVENOUS

## 2022-11-20 NOTE — ED Triage Notes (Signed)
Pt to ed from home via POV for dehydration. Pt was seen last night for alcohol intoxication and is still feeling dehydrated today. Pt is CAOx4, in no acute distress and ambulatory in triage. Pt has been drinking today as well. He was just at [facility] trying to get clean. Pt denies SI/HI.

## 2022-11-20 NOTE — ED Provider Notes (Signed)
[facility] Provider Note   Event Date/Time   First MD Initiated Contact with Patient 11/20/22 1542     (approximate) History  Dehydration (ETOH)  HPI Richard Osborn is a 47 y.o. male with a past medical history of alcohol abuse who presents complaining of dehydration in the setting of heavy alcohol abuse recently.  Patient states that he has been drinking for the last few days however is currently trying to detox in a facility however patient has had orthostatic lightheadedness and decreased urination over the past 24 hours concerning for dehydration. ROS: Patient currently denies any vision changes, tinnitus, difficulty speaking, facial droop, sore throat, chest pain, shortness of breath, abdominal pain, nausea/vomiting/diarrhea, dysuria, or weakness/numbness/paresthesias in any extremity   Physical Exam  Triage Vital Signs: ED Triage Vitals  Enc Vitals Group     BP 11/20/22 1519 120/63     Pulse Rate 11/20/22 1519 87     Resp 11/20/22 1519 16     Temp 11/20/22 1519 98 F (36.7 C)     Temp Source 11/20/22 1519 Oral     SpO2 11/20/22 1519 96 %     Weight 11/20/22 1517 293 lb 3.4 oz (133 kg)     Height 11/20/22 1517 5\' 9"  (1.753 m)     Head Circumference --      Peak Flow --      Pain Score 11/20/22 1517 0     Pain Loc --      Pain Edu? --      Excl. in GC? --    Most recent vital signs: Vitals:   11/20/22 1720 11/20/22 1822  BP: 107/75 (!) 98/53  Pulse: 69 73  Resp: 17 16  Temp: 98.2 F (36.8 C) 98 F (36.7 C)  SpO2: 99% 100%   General: Awake, oriented x4. CV:  Good peripheral perfusion.  Resp:  Normal effort.  Abd:  No distention.  Other:  Middle-aged obese Caucasian male laying in bed in no acute distress ED Results / Procedures / Treatments  Labs (all labs ordered are listed, but only abnormal results are displayed) Labs Reviewed  CBC - Abnormal; Notable for the following components:      Result Value   Hemoglobin 17.6 (*)    HCT  52.2 (*)    All other components within normal limits  COMPREHENSIVE METABOLIC PANEL - Abnormal; Notable for the following components:   Chloride 114 (*)    CO2 21 (*)    Calcium 8.2 (*)    All other components within normal limits  ETHANOL - Abnormal; Notable for the following components:   Alcohol, Ethyl (B) 226 (*)    All other components within normal limits  PROCEDURES: Critical Care performed: No .1-3 Lead EKG Interpretation  Performed by: Merwyn Katos, MD Authorized by: Merwyn Katos, MD     Interpretation: normal     ECG rate:  71   ECG rate assessment: normal     Rhythm: sinus rhythm     Ectopy: none     Conduction: normal    MEDICATIONS ORDERED IN ED: Medications  lactated ringers bolus 2,000 mL (0 mLs Intravenous Stopped 11/20/22 1823)   IMPRESSION / MDM / ASSESSMENT AND PLAN / ED COURSE  I reviewed the triage vital signs and the nursing notes.                             The  patient is on the cardiac monitor to evaluate for evidence of arrhythmia and/or significant heart rate changes. Patient's presentation is most consistent with acute presentation with potential threat to life or bodily function. This patient presents with generalized weakness and fatigue likely secondary to dehydration. Considered alternate etiologies of the patients symptoms including infectious processes, severe metabolic derangements or electrolyte abnormalities, ischemia/ACS, heart failure, and intracranial/central processes but think these are unlikely given the history and physical exam.  Dehydration likely due to excessive alcohol use  Plan: labs, 2L LR fluid resuscitation, pain/nausea control, reassessment Upon reassessment, patient's symptoms have improved after 2 L of LR and patient agrees with plan for discharge and follow-up with outpatient rehab resources.  Dispo: Discharge home with outpatient alcohol rehabilitation   FINAL CLINICAL IMPRESSION(S) / ED DIAGNOSES   Final  diagnoses:  Dehydration   Rx / DC Orders   ED Discharge Orders     None      Note:  This document was prepared using Dragon voice recognition software and may include unintentional dictation errors.   Merwyn Katos, MD 11/20/22 [phone]

## 2022-11-25 ENCOUNTER — Other Ambulatory Visit: Payer: Self-pay

## 2022-12-06 ENCOUNTER — Other Ambulatory Visit: Payer: Self-pay

## 2022-12-29 ENCOUNTER — Other Ambulatory Visit: Payer: Self-pay

## 2023-01-17 ENCOUNTER — Other Ambulatory Visit: Payer: Self-pay

## 2023-02-27 ENCOUNTER — Other Ambulatory Visit: Payer: Self-pay

## 2023-03-30 ENCOUNTER — Other Ambulatory Visit: Payer: Self-pay

## 2023-03-31 ENCOUNTER — Other Ambulatory Visit: Payer: Self-pay

## 2023-04-11 ENCOUNTER — Other Ambulatory Visit: Payer: Self-pay

## 2023-04-12 ENCOUNTER — Other Ambulatory Visit: Payer: Self-pay

## 2023-05-01 ENCOUNTER — Other Ambulatory Visit: Payer: Self-pay

## 2023-05-11 ENCOUNTER — Other Ambulatory Visit: Payer: Self-pay

## 2023-06-05 ENCOUNTER — Other Ambulatory Visit: Payer: Self-pay

## 2023-08-22 ENCOUNTER — Ambulatory Visit (INDEPENDENT_AMBULATORY_CARE_PROVIDER_SITE_OTHER): Payer: MEDICAID | Admitting: Pediatrics

## 2023-08-22 ENCOUNTER — Encounter: Payer: Self-pay | Admitting: Pediatrics

## 2023-08-22 VITALS — BP 126/72 | HR 76 | Temp 97.6°F | Ht 68.5 in | Wt 302.4 lb

## 2023-08-22 DIAGNOSIS — Z13220 Encounter for screening for lipoid disorders: Secondary | ICD-10-CM

## 2023-08-22 DIAGNOSIS — I2699 Other pulmonary embolism without acute cor pulmonale: Secondary | ICD-10-CM

## 2023-08-22 DIAGNOSIS — F17200 Nicotine dependence, unspecified, uncomplicated: Secondary | ICD-10-CM | POA: Diagnosis not present

## 2023-08-22 DIAGNOSIS — Z131 Encounter for screening for diabetes mellitus: Secondary | ICD-10-CM

## 2023-08-22 DIAGNOSIS — T7840XA Allergy, unspecified, initial encounter: Secondary | ICD-10-CM

## 2023-08-22 DIAGNOSIS — Z1330 Encounter for screening examination for mental health and behavioral disorders, unspecified: Secondary | ICD-10-CM | POA: Diagnosis not present

## 2023-08-22 DIAGNOSIS — Z86718 Personal history of other venous thrombosis and embolism: Secondary | ICD-10-CM

## 2023-08-22 DIAGNOSIS — Z7689 Persons encountering health services in other specified circumstances: Secondary | ICD-10-CM

## 2023-08-22 DIAGNOSIS — J342 Deviated nasal septum: Secondary | ICD-10-CM | POA: Insufficient documentation

## 2023-08-22 DIAGNOSIS — G4733 Obstructive sleep apnea (adult) (pediatric): Secondary | ICD-10-CM | POA: Diagnosis not present

## 2023-08-22 DIAGNOSIS — Z6841 Body Mass Index (BMI) 40.0 and over, adult: Secondary | ICD-10-CM | POA: Diagnosis not present

## 2023-08-22 DIAGNOSIS — E291 Testicular hypofunction: Secondary | ICD-10-CM | POA: Diagnosis not present

## 2023-08-22 DIAGNOSIS — I824Y9 Acute embolism and thrombosis of unspecified deep veins of unspecified proximal lower extremity: Secondary | ICD-10-CM

## 2023-08-22 MED ORDER — FLUTICASONE PROPIONATE 50 MCG/ACT NA SUSP: 2.0000 | Freq: Every day | NASAL | 6 refills | Status: DC

## 2023-08-22 MED ORDER — BUPROPION HCL 75 MG PO TABS
ORAL_TABLET | ORAL | 0 refills | Status: DC
Start: 2023-08-22 — End: 2023-09-13

## 2023-08-22 MED ORDER — CETIRIZINE HCL 10 MG PO TABS
10.0000 mg | ORAL_TABLET | Freq: Every day | ORAL | 11 refills | Status: AC
Start: 2023-08-22 — End: ?

## 2023-08-22 NOTE — Progress Notes (Unsigned)
 Establish Care Note  BP 126/72   Pulse 76   Temp 97.6 F (36.4 C) (Oral)   Ht 5' 8.5" (1.74 m)   Wt (!) 302 lb 6.4 oz (137.2 kg)   SpO2 95%   BMI 45.31 kg/m    Subjective:    Patient ID: Richard Osborn, male    DOB: [birth year], 48 y.o.   MRN: [id number]  HPI: Richard Osborn is a 48 y.o. male  Chief Complaint  Patient presents with  . Establish Care    Establishing care, the following was discussed today:  Discussed the use of AI scribe software for clinical note transcription with the patient, who gave verbal consent to proceed.  History of Present Illness   Richard Osborn is a 48 year old male who presents for management of CPAP equipment and medication review.  He has been using a CPAP machine for approximately 20 years but is currently unable to obtain supplies, and the equipment is in poor condition. He uses a ResMed brand CPAP but does not see a pulmonary doctor regularly.  He has difficulty breathing due to a deviated septum, which was surgically corrected in 2005 but has worsened over time. He experiences nasal obstruction, particularly in the right nostril, and has had a broken nose since the surgery. This nasal obstruction affects his ability to use a regular CPAP mask, necessitating the use of a full mask.  He has a history of pulmonary embolism and is currently on Xarelto, which is covered by Medicaid. He previously used Eliquis but switched due to insurance issues. He experiences leg pain and has a history of blood clots.  He has been taking testosterone since age 27 due to low levels after discontinuing steroid use from powerlifting. He currently obtains testosterone from non-prescription sources and administers 200 mg every ten days. He notes fluctuations in his weight, ranging from 385 pounds to under 300 pounds recently.  He experiences allergy symptoms, including sneezing and watery eyes, particularly at night, and has tried over-the-counter medications  like Benadryl and Zyrtec. He has used Flonase in the past.      Current Outpatient Medications on File Prior to Visit  Medication Sig Dispense Refill  . rivaroxaban (XARELTO) 20 MG TABS tablet Take 1 tablet (20 mg total) by mouth daily. 90 tablet 3  . tadalafil (CIALIS) 10 MG tablet Take by mouth.    Marland Kitchen amLODipine (NORVASC) 5 MG tablet Take 1 tablet (5 mg total) by mouth daily. (Patient not taking: Reported on 08/22/2023) 30 tablet 1  . Melatonin 3 MG CAPS Take 1 capsule (3 mg total) by mouth at bedtime. (Patient not taking: Reported on 08/22/2023) 30 capsule 0  . nicotine (NICODERM CQ - DOSED IN MG/24 HOURS) 21 mg/24hr patch Place 1 patch (21 mg total) onto the skin daily. (Patient not taking: Reported on 08/22/2023) 30 patch 1  . [DISCONTINUED] APIXABAN (ELIQUIS) VTE STARTER PACK (10MG  AND 5MG ) Use as directed, instructions on package. 74 tablet 0   No current facility-administered medications on file prior to visit.    #HM Will review HM records and updated as needed.  Relevant past medical, surgical, family and social history reviewed and updated as indicated. Interim medical history since our last visit reviewed. Allergies and medications reviewed and updated.  ROS per HPI unless specifically indicated above     Objective:    BP 126/72   Pulse 76   Temp 97.6 F (36.4 C) (Oral)   Ht 5' 8.5" (  1.74 m)   Wt (!) 302 lb 6.4 oz (137.2 kg)   SpO2 95%   BMI 45.31 kg/m   Wt Readings from Last 3 Encounters:  08/22/23 (!) 302 lb 6.4 oz (137.2 kg)  11/20/22 293 lb 3.4 oz (133 kg)  11/19/22 295 lb (133.8 kg)     Physical Exam      08/22/2023    1:55 PM 06/23/2022   11:10 AM  Depression screen PHQ 2/9  Decreased Interest 1 0  Down, Depressed, Hopeless 1 1  PHQ - 2 Score 2 1  Altered sleeping 2   Tired, decreased energy 0   Change in appetite 1   Feeling bad or failure about yourself  1   Trouble concentrating 1   Moving slowly or fidgety/restless 0   Suicidal thoughts 0    PHQ-9 Score 7   Difficult doing work/chores Not difficult at all         08/22/2023    1:56 PM  GAD 7 : Generalized Anxiety Score  Nervous, Anxious, on Edge 1  Control/stop worrying 1  Worry too much - different things 1  Trouble relaxing 1  Restless 1  Easily annoyed or irritable 2  Afraid - awful might happen 1  Total GAD 7 Score 8  Anxiety Difficulty Not difficult at all       Assessment & Plan:  Assessment & Plan   OSA (obstructive sleep apnea) Assessment & Plan: Currently using CPAP 70% of the time for at least 4 hours a night.  Orders: -     Ambulatory referral to ENT -     Comprehensive metabolic panel; Future  Bilateral pulmonary embolism (HCC)  BMI 45.0-49.9, adult (HCC) -     Comprehensive metabolic panel; Future  Hypogonadism male -     Testosterone, Free, Total, SHBG; Future  Acute deep vein thrombosis (DVT) of proximal vein of lower extremity, unspecified laterality (HCC)  Deviated septum -     Ambulatory referral to ENT  Allergy, initial encounter -     Cetirizine HCl; Take 1 tablet (10 mg total) by mouth daily.  Dispense: 30 tablet; Refill: 11 -     Fluticasone Propionate; Place 2 sprays into both nostrils daily.  Dispense: 16 g; Refill: 6  Nicotine dependence due to vaping non-tobacco product -     buPROPion HCl; Take 1 tablet (75 mg total) by mouth 2 (two) times daily for 7 days, THEN 2 tablets (150 mg total) 2 (two) times daily for 21 days.  Dispense: 98 tablet; Refill: 0  Lipid screening -     Lipid panel; Future  Diabetes mellitus screening -     Hemoglobin A1c; Future  Encounter to establish care  Encounter for behavioral health screening    Assessment and Plan    Obstructive Sleep Apnea Chronic obstructive sleep apnea managed with outdated CPAP equipment. Nasal obstruction complicates CPAP use. Discussed potential use of Ozempic or Mounjaro for symptom improvement and weight loss. Considered ENT and pulmonology referrals for  comprehensive management. - Refer to ENT for sleep apnea management and nasal obstruction evaluation. - Refer to pulmonology for CPAP management. - Order new CPAP equipment through [facility]. - Discuss potential use of Ozempic or Mounjaro for sleep apnea and weight loss.  Deviated Nasal Septum Deviated septum with right nostril obstruction affects CPAP use and breathing. - Refer to ENT for evaluation and management of deviated septum.  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 Well-managed on  Xarelto. Discussed Eliquis as an alternative with lower GI bleeding risk, considering insurance and dosing. - Continue Xarelto as prescribed. - Discuss insurance coverage for Eliquis if preferred.  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 Self-administering testosterone without prescription. Discussed importance of prescription for safety and efficacy. - Order testosterone level test at 8 AM. - Provide prescription for testosterone based on test results.  Allergic Rhinitis Symptoms likely due to cat allergy. Previous OTC medications provided limited relief. - Prescribe Flonase nasal spray. - Prescribe cetirizine (Zyrtec).  General Health Maintenance 48 years old, no colonoscopy. Discussed importance of routine screenings. - Schedule colonoscopy. - Schedule physical examination including cholesterol and A1c testing.  Follow-up Flexible schedule except week of April 5th due to wedding plans. - Schedule follow-up appointment for physical examination before April 5th.           Follow up plan: Return in about 1 week (around 08/29/2023) for Physical.  Jackolyn Confer, MD

## 2023-08-22 NOTE — Assessment & Plan Note (Signed)
 Currently using CPAP 70% of the time for at least 4 hours a night.

## 2023-08-22 NOTE — Patient Instructions (Addendum)
 Plan:  - stop by the lab at around 8am for labs   - ENT referral for sleep apnea and deviated septum - I will submit for zepbound for sleep apnea --> will message you once this is approved before you pick it up. - we will send you new CPAP supplies  For smoking cessation: - start welbutrin 75mg  daily, after 7 days start taking 2 (150mg  daily)  - continue xarelto for now  Good to meet you! Welcome to [facility]!  As your primary care doctor, I look forward to working with you to help you reach your health goals.  Please be aware of a couple of logistical items: - If you message me on mychart, it may take me 1-2 business days to get back to you. This is for non-urgent messaging.  - If you require urgent clinical attention, please call the clinic or present to urgent care/emergency room - If you have labs, I typically will send a message about them in 1-2 business days. - I am not here on Mondays, otherwise will be available from Tuesday-Friday during 8a-5pm.

## 2023-08-23 ENCOUNTER — Other Ambulatory Visit: Payer: MEDICAID

## 2023-08-23 DIAGNOSIS — Z13220 Encounter for screening for lipoid disorders: Secondary | ICD-10-CM

## 2023-08-23 DIAGNOSIS — Z6841 Body Mass Index (BMI) 40.0 and over, adult: Secondary | ICD-10-CM

## 2023-08-23 DIAGNOSIS — E291 Testicular hypofunction: Secondary | ICD-10-CM

## 2023-08-23 DIAGNOSIS — G4733 Obstructive sleep apnea (adult) (pediatric): Secondary | ICD-10-CM

## 2023-08-23 DIAGNOSIS — Z131 Encounter for screening for diabetes mellitus: Secondary | ICD-10-CM

## 2023-08-24 ENCOUNTER — Encounter: Payer: Self-pay | Admitting: Pediatrics

## 2023-08-24 NOTE — Assessment & Plan Note (Deleted)
 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 Well-managed on Xarelto. Discussed Eliquis as an alternative with lower GI bleeding risk, considering insurance and dosing. - Continue Xarelto as prescribed. - Discuss insurance coverage for Eliquis if preferred.

## 2023-08-24 NOTE — Assessment & Plan Note (Signed)
 Testosterone deficiency secondary to past steroid use. Self-administering testosterone without prescription. Discussed importance of prescription for safety and efficacy. - Order testosterone level test at 8 AM. - Provide prescription for testosterone based on test results.

## 2023-08-24 NOTE — Assessment & Plan Note (Signed)
 Discussed GLP1 therapy. Will revisit more at follow up. May benefit from zepbound for OSA as well.

## 2023-08-24 NOTE — Assessment & Plan Note (Signed)
 Symptoms likely due to cat allergy. Previous OTC medications provided limited relief. - Prescribe Flonase nasal spray. - Prescribe cetirizine (Zyrtec).

## 2023-08-24 NOTE — Assessment & Plan Note (Signed)
 Deviated septum with right nostril obstruction affects CPAP use and breathing. - Refer to ENT for evaluation and management of deviated septum.

## 2023-08-29 ENCOUNTER — Encounter: Payer: Self-pay | Admitting: Pediatrics

## 2023-08-29 ENCOUNTER — Ambulatory Visit (INDEPENDENT_AMBULATORY_CARE_PROVIDER_SITE_OTHER): Payer: MEDICAID | Admitting: Pediatrics

## 2023-08-29 VITALS — BP 125/75 | HR 61 | Temp 98.0°F | Ht 69.0 in | Wt 298.0 lb

## 2023-08-29 DIAGNOSIS — Z1211 Encounter for screening for malignant neoplasm of colon: Secondary | ICD-10-CM

## 2023-08-29 DIAGNOSIS — Z1330 Encounter for screening examination for mental health and behavioral disorders, unspecified: Secondary | ICD-10-CM

## 2023-08-29 DIAGNOSIS — Z0000 Encounter for general adult medical examination without abnormal findings: Secondary | ICD-10-CM

## 2023-08-29 DIAGNOSIS — E291 Testicular hypofunction: Secondary | ICD-10-CM | POA: Diagnosis not present

## 2023-08-29 LAB — COMPREHENSIVE METABOLIC PANEL WITH GFR
ALT: 35 IU/L (ref 0–44)
AST: 32 IU/L (ref 0–40)
Albumin: 4 g/dL — ABNORMAL LOW (ref 4.1–5.1)
Alkaline Phosphatase: 60 IU/L (ref 44–121)
BUN/Creatinine Ratio: 13 (ref 9–20)
BUN: 14 mg/dL (ref 6–24)
Bilirubin Total: 1.7 mg/dL — ABNORMAL HIGH (ref 0.0–1.2)
CO2: 22 mmol/L (ref 20–29)
Calcium: 8.9 mg/dL (ref 8.7–10.2)
Chloride: 104 mmol/L (ref 96–106)
Creatinine, Ser: 1.1 mg/dL (ref 0.76–1.27)
Globulin, Total: 2.7 g/dL (ref 1.5–4.5)
Glucose: 78 mg/dL (ref 70–99)
Potassium: 4.4 mmol/L (ref 3.5–5.2)
Sodium: 140 mmol/L (ref 134–144)
Total Protein: 6.7 g/dL (ref 6.0–8.5)
eGFR: 83 mL/min/{1.73_m2} (ref >59)

## 2023-08-29 LAB — HEMOGLOBIN A1C
Est. average glucose Bld gHb Est-mCnc: 97 mg/dL
Hgb A1c MFr Bld: 5 % (ref 4.8–5.6)

## 2023-08-29 LAB — LIPID PANEL
Chol/HDL Ratio: 4.1 ratio (ref 0.0–5.0)
Cholesterol, Total: 144 mg/dL (ref 100–199)
HDL: 35 mg/dL — ABNORMAL LOW (ref >39)
LDL Chol Calc (NIH): 95 mg/dL (ref 0–99)
Triglycerides: 71 mg/dL (ref 0–149)
VLDL Cholesterol Cal: 14 mg/dL (ref 5–40)

## 2023-08-29 LAB — TESTOSTERONE, FREE, TOTAL, SHBG
Sex Hormone Binding: 25.8 nmol/L (ref 16.5–55.9)
Testosterone, Free: 19.8 pg/mL (ref 6.8–21.5)
Testosterone: 1071 ng/dL — ABNORMAL HIGH (ref 264–916)

## 2023-08-29 MED ORDER — TESTOSTERONE CYPIONATE 200 MG/ML IM SOLN: 150.0000 mg | INTRAMUSCULAR | 3 refills | Status: DC

## 2023-08-29 NOTE — Progress Notes (Signed)
 BP 125/75   Pulse 61   Temp 98 F (36.7 C) (Oral)   Ht 5\' 9"  (1.753 m)   Wt 298 lb (135.2 kg)   SpO2 98%   BMI 44.01 kg/m    Annual Physical Exam - Male  Subjective:   CC: Annual Exam   Richard Osborn is a 48 y.o. male patient here for a preventative health maintenance exam and has no acute complaints  Health Habits: DIET: in general, a "healthy" diet   EXERCISE: very active, weight 6X/week DENTAL EXAM: due, provide list EYE EXAM: requesting optometry referral                              Sex History:  Sexually active : Yes with male. Barrier protection :  n/a  Family History  Problem Relation Age of Onset   Healthy Mother    Healthy Father    Diabetes Maternal Grandmother    Alzheimer's disease Maternal Grandmother    Other Maternal Grandfather        unknown medical history   Emphysema Paternal Grandmother    Other Paternal Grandfather        unknown medical history   Diabetes Other        grandmother                      Social History   Tobacco Use   Smoking status: Former    Current packs/day: 0.00    Average packs/day: 1 pack/day for 20.0 years (20.0 ttl pk-yrs)    Types: Cigarettes    Start date: 05/2002    Quit date: 05/2022    Years since quitting: 1.2   Smokeless tobacco: Never   Tobacco comments:    Patient resides at [facility] since 06/09/22. Using started using Nicotine patches 06/22/22  Vaping Use   Vaping status: Every Day  Substance Use Topics   Alcohol use: Not Currently    Comment: 1/5th of vodka daily, last use 03/15/22   Drug use: Not Currently    Types: Cocaine, Marijuana    Comment: last use ~2013   Social History   Social History Narrative   Pt is a past EMT    Social drivers questionnaire is reviewed and is positive for : none  Depression Screening:     08/29/2023    8:29 AM 08/22/2023    1:55 PM 06/23/2022   11:10 AM  Depression screen PHQ 2/9  Decreased Interest 1 1 0  Down, Depressed, Hopeless 1 1 1   PHQ - 2  Score 2 2 1   Altered sleeping 2 2   Tired, decreased energy 1 0   Change in appetite 1 1   Feeling bad or failure about yourself  1 1   Trouble concentrating 0 1   Moving slowly or fidgety/restless 0 0   Suicidal thoughts  0   PHQ-9 Score 7 7   Difficult doing work/chores Not difficult at all Not difficult at all         08/29/2023    8:29 AM 08/22/2023    1:56 PM  GAD 7 : Generalized Anxiety Score  Nervous, Anxious, on Edge 1 1  Control/stop worrying 1 1  Worry too much - different things 1 1  Trouble relaxing 1 1  Restless 0 1  Easily annoyed or irritable 2 2  Afraid - awful might happen 1 1  Total GAD 7 Score  7 8  Anxiety Difficulty Not difficult at all Not difficult at all      Depression Severity and Treatment Recommendations:  0-4= None  5-9= Mild / Treatment: Support, educate to call if worse; return in one month  10-14= Moderate / Treatment: Support, watchful waiting; Antidepressant or Psychotherapy  15-19= Moderately severe / Treatment: Antidepressant OR Psychotherapy  >= 20= Major depression, severe / Antidepressant AND Psychotherapy  Mental Health Plan: CTM  Health Maintenance Colon Cancer Screening : overdue, will order today Prostate Cancer Screening :  n/a due to age Immunizations : up to date and documented  Self Management Goals  Goals   None     Review of Systems Per HPI   Current Outpatient Medications (Endocrine & Metabolic):    testosterone cypionate (DEPOTESTOSTERONE CYPIONATE) 200 MG/ML injection, Inject 0.75 mLs (150 mg total) into the muscle every 14 (fourteen) days.  Current Outpatient Medications (Cardiovascular):    amLODipine (NORVASC) 5 MG tablet, Take 1 tablet (5 mg total) by mouth daily. (Patient not taking: Reported on 08/29/2023)   tadalafil (CIALIS) 10 MG tablet, Take by mouth. (Patient not taking: Reported on 08/29/2023)  Current Outpatient Medications (Respiratory):    cetirizine (ZYRTEC) 10 MG tablet, Take 1 tablet (10 mg  total) by mouth daily.   fluticasone (FLONASE) 50 MCG/ACT nasal spray, Place 2 sprays into both nostrils daily.   Current Outpatient Medications (Hematological):    rivaroxaban (XARELTO) 20 MG TABS tablet, Take 1 tablet (20 mg total) by mouth daily. (Patient not taking: Reported on 08/29/2023)  Current Outpatient Medications (Other):    buPROPion (WELLBUTRIN) 75 MG tablet, Take 1 tablet (75 mg total) by mouth 2 (two) times daily for 7 days, THEN 2 tablets (150 mg total) 2 (two) times daily for 21 days.   Melatonin 3 MG CAPS, Take 1 capsule (3 mg total) by mouth at bedtime. (Patient not taking: Reported on 08/29/2023)   nicotine (NICODERM CQ - DOSED IN MG/24 HOURS) 21 mg/24hr patch, Place 1 patch (21 mg total) onto the skin daily. 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  HENT:     Head: Normocephalic and atraumatic.  Eyes:     Pupils: Pupils are equal, round, and reactive to light.  Cardiovascular:     Rate and Rhythm: Normal rate and regular rhythm.     Pulses: Normal pulses.     Heart sounds: Normal heart sounds.  Pulmonary:     Effort: Pulmonary effort is normal.     Breath sounds: Normal breath sounds.  Abdominal:     General: Abdomen is flat.      Palpations: Abdomen is soft.  Musculoskeletal:        General: Normal  range of motion.     Cervical back: Normal range of motion.  Skin:    General: Skin is warm and dry.     Capillary Refill: Capillary refill takes less than 2 seconds.  Neurological:     General: No focal deficit present.     Mental Status: He is alert. Mental status is at baseline.  Psychiatric:        Mood and Affect: Mood normal.        Behavior: Behavior normal.    Assessment and Plan:   Annual physical exam Discussed lifestyle modifications and goals including plant based eating styles (such as: Mediterranean eating style), regular exercise (at least 150 min of moderate-intensity aerobic exercise per week, given AHA workout handout), get adequate sleep, and continue working with PCP towards meeting health goals to ensure healthy aging.   Hypogonadism male Assessment & Plan: Testosterone level >1000. Has been getting from unclear source. Plan to prescribe and lower dose to 150mg  q14days. Re-evaluate in a few weeks. Future lab order in place.  Orders: -     Testosterone, Free, Total, SHBG; Future -     Testosterone Cypionate; Inject 0.75 mLs (150 mg total) into the muscle every 14 (fourteen) days.  Dispense: 10 mL; Refill: 3  Encounter for behavioral health screening As part of their intake evaluation, the patient was screened for depression, anxiety.  PHQ9 SCORE 7, GAD7 SCORE 7. Screening results negative for tested conditions. CTM.  Screen for colon cancer -     Cologuard   This plan was discussed with the patient and questions were answered. There were no further concerns.  Follow up as indicated, or sooner should any new problems arise, if conditions worsen, or if they are otherwise concerned.   See patient instructions for additional information.  Jackolyn Confer, MD Family Medicine       Future Appointments  Date Time Provider Department Center  09/06/2023  9:00 AM [facility]   09/22/2023  8:00 AM Jackolyn Confer, MD [facility]

## 2023-08-29 NOTE — Assessment & Plan Note (Signed)
 Testosterone level >1000. Has been getting from unclear source. Plan to prescribe and lower dose to 150mg  q14days. Re-evaluate in a few weeks. Future lab order in place.

## 2023-08-29 NOTE — Patient Instructions (Addendum)
 Stop by the lab 4/9 or 4/10 to redraw testosterone, I'll message you if more adjustments needed.  I recommend dropping to 100mg   We will check back in on the welbutrin in a few weeks  [facility] Phone Number Practice Name  [place] [facility] [address] [place] [phone] Alric Quan CRISP DDS MS PA  [place] [facility] [address] [place] [phone] Enid Derry DDS PA III  [place] [facility]ist [address] [place] [phone] [facility]  [place] [facility] [address] [place] [phone] [facility]  [place] [facility] [address] [place] [phone] Sheral Apley IV DMD PA  [place] [facility] [address] [place] [phone] [facility] FAMILY DENT[facility]  [place] [facility] [address] [place] [phone] [facility] General Dentist [address] [place] [phone] Yevette Edwards  [place] [facility] [address] [place] [phone] [facility]  [place] [facility] [address] [place] [phone] [facility] Oral Surgeon [address] Louellen Molder [phone] Charna Archer DMD MS [facility]  [place] [facility] [address] [place] [phone] [facility] PEDIATRIC DENTISTRY

## 2023-09-06 ENCOUNTER — Other Ambulatory Visit: Payer: MEDICAID

## 2023-09-06 DIAGNOSIS — E291 Testicular hypofunction: Secondary | ICD-10-CM

## 2023-09-08 ENCOUNTER — Telehealth: Payer: Self-pay | Admitting: Pediatrics

## 2023-09-09 LAB — TESTOSTERONE, FREE, TOTAL, SHBG
Sex Hormone Binding: 27.1 nmol/L (ref 16.5–55.9)
Testosterone, Free: 30.1 pg/mL — ABNORMAL HIGH (ref 6.8–21.5)
Testosterone: 1371 ng/dL — ABNORMAL HIGH (ref 264–916)

## 2023-09-11 ENCOUNTER — Other Ambulatory Visit: Payer: Self-pay | Admitting: Pediatrics

## 2023-09-11 DIAGNOSIS — F17200 Nicotine dependence, unspecified, uncomplicated: Secondary | ICD-10-CM

## 2023-09-12 NOTE — Telephone Encounter (Signed)
 Requested medication (s) are due for refill today: yes  Requested medication (s) are on the active medication list: yes  Last refill:  08/22/23 Future visit scheduled: yes  Notes to clinic:  Pharmacy comment: REQUEST FOR 90 DAYS PRESCRIPTION. DX Code Needed.      Requested Prescriptions  Pending Prescriptions Disp Refills   buPROPion (WELLBUTRIN) 75 MG tablet [Pharmacy Med Name: BUPROPION HCL 75 MG TABLET] 294 tablet 1    Sig: TAKE 1 TABLET 2 (TWO) TIMES DAILY FOR 7 DAYS, THEN 2 TABLETS 2 (TWO) TIMES DAILY FOR 21 DAYS.     Psychiatry: Antidepressants - bupropion Passed - 09/12/2023  2:14 PM      Passed - Cr in normal range and within 360 days    Creatinine, Ser  Date Value Ref Range Status  08/23/2023 1.10 0.76 - 1.27 mg/dL Final         Passed - AST in normal range and within 360 days    AST  Date Value Ref Range Status  08/23/2023 32 0 - 40 IU/L Final         Passed - ALT in normal range and within 360 days    ALT  Date Value Ref Range Status  08/23/2023 35 0 - 44 IU/L Final         Passed - Last BP in normal range    BP Readings from Last 1 Encounters:  08/29/23 125/75         Passed - Valid encounter within last 6 months    Recent Outpatient Visits           2 weeks ago Annual physical exam   [place] [facility] Hadassah Letters, MD   3 weeks ago OSA (obstructive sleep apnea)   [place] [facility] Hadassah Letters, MD

## 2023-09-13 ENCOUNTER — Other Ambulatory Visit: Payer: Self-pay | Admitting: Pediatrics

## 2023-09-13 DIAGNOSIS — F17200 Nicotine dependence, unspecified, uncomplicated: Secondary | ICD-10-CM

## 2023-09-13 MED ORDER — BUPROPION HCL ER (XL) 150 MG PO TB24
150.0000 mg | ORAL_TABLET | Freq: Every day | ORAL | 3 refills | Status: DC
Start: 2023-09-13 — End: 2023-09-22

## 2023-09-13 NOTE — Progress Notes (Signed)
 Sent 150mg  fill to pharmacy.  Hadassah Letters, MD

## 2023-09-13 NOTE — Telephone Encounter (Signed)
 Sent as separate order and dosing.

## 2023-09-14 LAB — COLOGUARD: COLOGUARD: NEGATIVE

## 2023-09-16 ENCOUNTER — Other Ambulatory Visit: Payer: Self-pay | Admitting: Pediatrics

## 2023-09-16 DIAGNOSIS — F17200 Nicotine dependence, unspecified, uncomplicated: Secondary | ICD-10-CM

## 2023-09-18 NOTE — Telephone Encounter (Signed)
 Requested Prescriptions  Pending Prescriptions Disp Refills   buPROPion  (WELLBUTRIN ) 75 MG tablet [Pharmacy Med Name: BUPROPION  HCL 75 MG TABLET] 98 tablet 0    Sig: TAKE 1 TABLET 2 (TWO) TIMES DAILY FOR 7 DAYS, THEN 2 TABLETS 2 (TWO) TIMES DAILY FOR 21 DAYS.     Psychiatry: Antidepressants - bupropion  Passed - 09/18/2023 11:15 AM      Passed - Cr in normal range and within 360 days    Creatinine, Ser  Date Value Ref Range Status  08/23/2023 1.10 0.76 - 1.27 mg/dL Final         Passed - AST in normal range and within 360 days    AST  Date Value Ref Range Status  08/23/2023 32 0 - 40 IU/L Final         Passed - ALT in normal range and within 360 days    ALT  Date Value Ref Range Status  08/23/2023 35 0 - 44 IU/L Final         Passed - Last BP in normal range    BP Readings from Last 1 Encounters:  08/29/23 125/75         Passed - Valid encounter within last 6 months    Recent Outpatient Visits           2 weeks ago Annual physical exam   [place] [facility] Hadassah Letters, MD   3 weeks ago OSA (obstructive sleep apnea)   [place] [facility] Hadassah Letters, MD

## 2023-09-22 ENCOUNTER — Encounter: Payer: Self-pay | Admitting: Pediatrics

## 2023-09-22 ENCOUNTER — Ambulatory Visit (INDEPENDENT_AMBULATORY_CARE_PROVIDER_SITE_OTHER): Payer: MEDICAID | Admitting: Pediatrics

## 2023-09-22 VITALS — BP 95/56 | HR 65 | Temp 98.6°F | Resp 17 | Ht 69.02 in | Wt 290.0 lb

## 2023-09-22 DIAGNOSIS — G4733 Obstructive sleep apnea (adult) (pediatric): Secondary | ICD-10-CM

## 2023-09-22 DIAGNOSIS — F17210 Nicotine dependence, cigarettes, uncomplicated: Secondary | ICD-10-CM | POA: Diagnosis not present

## 2023-09-22 DIAGNOSIS — Z1330 Encounter for screening examination for mental health and behavioral disorders, unspecified: Secondary | ICD-10-CM | POA: Diagnosis not present

## 2023-09-22 DIAGNOSIS — E291 Testicular hypofunction: Secondary | ICD-10-CM

## 2023-09-22 DIAGNOSIS — R600 Localized edema: Secondary | ICD-10-CM

## 2023-09-22 DIAGNOSIS — I2699 Other pulmonary embolism without acute cor pulmonale: Secondary | ICD-10-CM

## 2023-09-22 MED ORDER — NICOTINE POLACRILEX 2 MG MT GUM: 2.0000 mg | CHEWING_GUM | OROMUCOSAL | 1 refills | Status: DC | PRN

## 2023-09-22 MED ORDER — FUROSEMIDE 20 MG PO TABS: 20.0000 mg | ORAL_TABLET | Freq: Every day | ORAL | 3 refills | Status: DC | PRN

## 2023-09-22 MED ORDER — NICOTINE 21 MG/24HR TD PT24
21.0000 mg | MEDICATED_PATCH | Freq: Every day | TRANSDERMAL | 0 refills | Status: AC
Start: 2023-09-22 — End: 2023-10-13

## 2023-09-22 MED ORDER — NICOTINE 14 MG/24HR TD PT24: 14.0000 mg | MEDICATED_PATCH | Freq: Every day | TRANSDERMAL | 0 refills | Status: AC

## 2023-09-22 MED ORDER — NICOTINE 7 MG/24HR TD PT24: 7.0000 mg | MEDICATED_PATCH | Freq: Every day | TRANSDERMAL | 0 refills | Status: AC

## 2023-09-22 MED ORDER — VARENICLINE TARTRATE 0.5 MG PO TABS
ORAL_TABLET | ORAL | 1 refills | Status: DC
Start: 2023-09-22 — End: 2023-10-24

## 2023-09-22 MED ORDER — RIVAROXABAN 20 MG PO TABS: 20.0000 mg | ORAL_TABLET | Freq: Every day | ORAL | 3 refills | Status: AC

## 2023-09-22 MED ORDER — ZEPBOUND 2.5 MG/0.5ML ~~LOC~~ SOAJ: 2.5000 mg | SUBCUTANEOUS | 0 refills | Status: DC

## 2023-09-22 NOTE — Progress Notes (Signed)
 Office Visit  BP (!) 95/56 (BP Location: Left Arm, Patient Position: Sitting, Cuff Size: Large)   Pulse 65   Temp 98.6 F (37 C) (Oral)   Resp 17   Ht 5' 9.02" (1.753 m)   Wt 290 lb (131.5 kg)   SpO2 96%   BMI 42.81 kg/m    Subjective:    Patient ID: Richard Osborn, male    DOB: 1975/05/31, 48 y.o.   MRN: [id number]  HPI: Richard Osborn is a 48 y.o. male  Chief Complaint  Patient presents with   Testosterone  therapy   Nicotine  Dependence    Discussed the use of AI scribe software for clinical note transcription with the patient, who gave verbal consent to proceed.  History of Present Illness   Richard Osborn is a 48 year old male who presents for follow-up on nicotine  replacement therapy and testosterone  therapy.  Richard Osborn does not feel any effects from Wellbutrin  for nicotine  replacement. The only successful attempt to quit smoking was with Chantix and the patch, despite experiencing nightmares. Richard Osborn recently started a higher dose of Wellbutrin  (150 mg) two days ago, with plans to increase to 300 mg, but has not noticed any changes yet. Richard Osborn experiences cravings for nicotine  throughout the day, regardless of the form, and has previously become addicted to nicotine  gum. Richard Osborn finds the patch ineffective because it does not require active use.  Richard Osborn is currently on testosterone  therapy and notes an improvement in his condition, attributing it to the pharmaceutical grade of the medication. Richard Osborn injects testosterone  every 14 days on Mondays and has observed an increase in his levels compared to previous non-pharmaceutical sources.  Richard Osborn is taking Xarelto  20 mg daily, initially prescribed by the ER, with refills provided by a doctor at a treatment center. Richard Osborn has one refill left. There is no indication that his previous clot was related to testosterone  use.  Richard Osborn experiences swelling in both legs, with the right leg being worse, especially by the end of the day if Richard Osborn does not wear compression socks.  Richard Osborn has a history of cellulitis in the right leg. Lasix has been effective in the past for managing the swelling.  Richard Osborn is in the process of transferring his Medicaid to [facility], which is necessary for his ENT appointment. Richard Osborn has an upcoming pulmonary appointment in June and expects to receive sleep apnea supplies soon.  Richard Osborn is almost ten months sober and wants to quit nicotine  to avoid any addictions.       Relevant past medical, surgical, family and social history reviewed and updated as indicated. Interim medical history since our last visit reviewed. Allergies and medications reviewed and updated.  ROS per HPI unless specifically indicated above     Objective:    BP (!) 95/56 (BP Location: Left Arm, Patient Position: Sitting, Cuff Size: Large)   Pulse 65   Temp 98.6 F (37 C) (Oral)   Resp 17   Ht 5' 9.02" (1.753 m)   Wt 290 lb (131.5 kg)   SpO2 96%   BMI 42.81 kg/m   Wt Readings from Last 3 Encounters:  09/22/23 290 lb (131.5 kg)  08/29/23 298 lb (135.2 kg)  08/22/23 (!) 302 lb 6.4 oz (137.2 kg)     Physical Exam Constitutional:      Appearance: Normal appearance.  HENT:     Head: Normocephalic and atraumatic.  Eyes:     Pupils: Pupils are equal, round, and reactive to light.  Cardiovascular:  Rate and Rhythm: Normal rate and regular rhythm.     Pulses: Normal pulses.     Heart sounds: Normal heart sounds.  Pulmonary:     Effort: Pulmonary effort is normal.     Breath sounds: Normal breath sounds.  Abdominal:     Tenderness: There is no right CVA tenderness.  Musculoskeletal:        General: Swelling present. Normal range of motion.     Cervical back: Normal range of motion.     Comments: Non pitting bilateral lower extremity swelling  Skin:    General: Skin is warm and dry.     Capillary Refill: Capillary refill takes less than 2 seconds.  Neurological:     General: No focal deficit present.     Mental Status: Richard Osborn is alert. Mental status is at  baseline.  Psychiatric:        Mood and Affect: Mood normal.        Behavior: Behavior normal.         09/22/2023    8:15 AM 08/29/2023    8:29 AM 08/22/2023    1:55 PM 06/23/2022   11:10 AM  Depression screen PHQ 2/9  Decreased Interest 1 1 1  0  Down, Depressed, Hopeless 1 1 1 1   PHQ - 2 Score 2 2 2 1   Altered sleeping 2 2 2    Tired, decreased energy 1 1 0   Change in appetite 0 1 1   Feeling bad or failure about yourself  1 1 1    Trouble concentrating 0 0 1   Moving slowly or fidgety/restless 0 0 0   Suicidal thoughts 0  0   PHQ-9 Score 6 7 7    Difficult doing work/chores Not difficult at all Not difficult at all Not difficult at all        09/22/2023    8:15 AM 08/29/2023    8:29 AM 08/22/2023    1:56 PM  GAD 7 : Generalized Anxiety Score  Nervous, Anxious, on Edge 2 1 1   Control/stop worrying 1 1 1   Worry too much - different things 1 1 1   Trouble relaxing 1 1 1   Restless 0 0 1  Easily annoyed or irritable 2 2 2   Afraid - awful might happen 1 1 1   Total GAD 7 Score 8 7 8   Anxiety Difficulty Somewhat difficult Not difficult at all Not difficult at all       Assessment & Plan:  Assessment & Plan   Hypogonadism male Assessment & Plan: Improvement on pharmaceutical-grade testosterone . Current regimen: injections every 14 days. Plan to reassess testosterone  levels. - Schedule testosterone  level check in two months, ideally on the seventh day post-injection.   Cigarette nicotine  dependence without complication Assessment & Plan: Bupropion  ineffective for smoking cessation, ok to stop. Previous success with varenicline and nicotine  patch. Strong desire to quit nicotine . Total time spent: 12 minutes. - Prescribe nicotine  patch and varenicline. - Consider nicotine  gum as an alternative to vaping.  Orders: -     Nicotine ; Place 1 patch (21 mg total) onto the skin daily for 21 days.  Dispense: 21 patch; Refill: 0 -     Nicotine ; Place 1 patch (14 mg total) onto the skin  daily for 14 days.  Dispense: 14 patch; Refill: 0 -     Nicotine ; Place 1 patch (7 mg total) onto the skin daily for 14 days.  Dispense: 14 patch; Refill: 0 -     Nicotine  Polacrilex; Take 1  each (2 mg total) by mouth as needed for smoking cessation.  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 Requesting refills. Indicated for lifetime.  Orders: -     Rivaroxaban ; Take 1 tablet (20 mg total) by mouth daily.  Dispense: 90 tablet; Refill: 3  OSA (obstructive sleep apnea) Assessment & Plan: Awaiting pulmonary specialist appointment and supplies. Medicaid transfer needed for ENT consultation. Discussed Zepbound for sleep apnea and weight management pending insurance approval. Uses CPAP at night.  Orders: -     Zepbound; Inject 2.5 mg into the skin once a week.  Dispense: 2 mL; Refill: 0  Bilateral lower extremity edema Swelling in both legs, worse in right leg; chronic issue. On blood thinner. Compression stockings used. Furosemide previously effective. I do wonder if amlodipine  contributing. Plan to stop. Given short course of prn lasix. Suspect venous stasis from body habitus which suspect will also improve with weight management as Richard Osborn has been doing.  - Prescribe low-dose furosemide as needed. - Advise morning intake of furosemide to avoid nocturia. - Monitor response and adjust treatment as necessary. -     Furosemide; Take 1 tablet (20 mg total) by mouth daily as needed for edema.  Dispense: 30 tablet; Refill: 3  Encounter for behavioral health screening As part of their intake evaluation, the patient was screened for depression, anxiety.  PHQ9 SCORE 6, GAD7 SCORE 8. Screening  results negative for tested conditions. CTM. Stop welbutrin given no benefit since starting.    Follow up plan: Return in about 2 months (around 11/22/2023) for hypogonadism, smoking.  Hadassah Letters, MD

## 2023-09-22 NOTE — Assessment & Plan Note (Signed)
 Awaiting pulmonary specialist appointment and supplies. Medicaid transfer needed for ENT consultation. Discussed Zepbound for sleep apnea and weight management pending insurance approval. Uses CPAP at night.

## 2023-09-22 NOTE — Patient Instructions (Addendum)
 Stop welbutrin  Chantix: Initial, 0.5 mg orally once daily for 3 days, then 0.5 mg twice daily on days 4 through 7, and then 1 mg twice daily thereafter for 12 weeks of treatment; 12 additional weeks of therapy  Nicotine  patches: 21mg  for 28 days 14mg  for 14 days 7mg  for 14 days  Sent nicotine  gum as well   Sent lasix for swelling as needed  We will repeat testosterone  in 2 months  I will try to submit for zepbound for sleep apnea and weight management

## 2023-09-22 NOTE — Assessment & Plan Note (Signed)
 Chronic, asymptomatic. Requesting refills. Indicated for lifetime.

## 2023-09-22 NOTE — Assessment & Plan Note (Signed)
 Improvement on pharmaceutical-grade testosterone . Current regimen: injections every 14 days. Plan to reassess testosterone  levels. - Schedule testosterone  level check in two months, ideally on the seventh day post-injection.

## 2023-09-22 NOTE — Assessment & Plan Note (Signed)
 Bupropion  ineffective for smoking cessation. Previous success with varenicline and nicotine  patch. Strong desire to quit nicotine . Total time spent: 12 minutes. - Prescribe nicotine  patch and varenicline. - Consider nicotine  gum as an alternative to vaping.

## 2023-10-02 ENCOUNTER — Other Ambulatory Visit: Payer: Self-pay | Admitting: Pediatrics

## 2023-10-02 DIAGNOSIS — G4733 Obstructive sleep apnea (adult) (pediatric): Secondary | ICD-10-CM

## 2023-10-04 ENCOUNTER — Other Ambulatory Visit: Payer: Self-pay | Admitting: Pediatrics

## 2023-10-04 DIAGNOSIS — Z6841 Body Mass Index (BMI) 40.0 and over, adult: Secondary | ICD-10-CM

## 2023-10-04 DIAGNOSIS — G4733 Obstructive sleep apnea (adult) (pediatric): Secondary | ICD-10-CM

## 2023-10-04 MED ORDER — WEGOVY 0.25 MG/0.5ML ~~LOC~~ SOAJ: 0.2500 mg | SUBCUTANEOUS | 0 refills | Status: DC

## 2023-10-04 NOTE — Telephone Encounter (Signed)
 Requested medication (s) are due for refill today: harmacy comment: Alternative Requested:NOT COVERED.   Requested medication (s) are on the active medication list: yes  Last refill:  09/22/23  Future visit scheduled: yes  Notes to clinic:  harmacy comment: Alternative Requested:NOT COVERED.      Requested Prescriptions  Pending Prescriptions Disp Refills   ZEPBOUND  2.5 MG/0.5ML Pen [Pharmacy Med Name: ZEPBOUND  2.5 MG/0.5 ML PEN]  0    Sig: INJECT 2.5 MG SUBCUTANEOUSLY WEEKLY     Off-Protocol Failed - 10/04/2023  8:31 AM      Failed - Medication not assigned to a protocol, review manually.      Passed - Valid encounter within last 12 months    Recent Outpatient Visits           1 week ago Hypogonadism male   [place] [facility] Hadassah Letters, MD   1 month ago Annual physical exam   [place] [facility] Hadassah Letters, MD   1 month ago OSA (obstructive sleep apnea)   [place] [facility] Hadassah Letters, MD

## 2023-10-04 NOTE — Telephone Encounter (Signed)
 He will need to first trial [facility] and failed before Zepbound  will be covered as Medicaid covers for weight management and not OSA. Also would be incredibly helpful to add the [facility] to your last note which will allow Medicaid to approve the needed PA.

## 2023-10-04 NOTE — Progress Notes (Signed)
 Resending alternative GLP1. Previously sent zepbound  which is not covered despite OSA.  Clinical coverage for weight loss GLP's  Medication being dispensed is Wegovy 3 mL/28 day. Titration doses are 2 mL/28 days.   [x]  Product being prescribed is FDA approved for the indication, age, weight (if applicable) and not does not exceed dosing limits per the Prescribing Information per the clinical conditions for use.  [x]  Patient's baseline weight measured within the last 45 days as required by provider before dispensing.  [x]  Patient is new to therapy and One of the following:   [x]  The beneficiary is 49 years of age or over and has ONE of the following:  [x]  A BMI greater than or equal to 30 kg/m2  []  A BMI greater than or equal to 27 kg/m2 with at least one weight-related comorbidity/risk factor/complication (i.e. hypertension, type 2 diabetes, obstructive sleep apnea, cardiovascular disease, dyslipidemia)  If patient has one weight-related comorbidity/risk factor/complication (i.e. hypertension, type 2 diabetes, obstructive sleep apnea, cardiovascular disease, dyslipidemia), please list: OSA  [x]  The beneficiary is currently on and will continue lifestyle modification including structured nutrition and physical activity, unless physical activity is not clinically appropriate at the time GLP1 therapy commences AND  [x]  The beneficiary will NOT be using the requested agent in combination with another GLP-1 receptor agonist agent AND  [x]  The beneficiary does NOT have any FDA-labeled contraindications to the requested agent, including pregnancy, lactation, history of medullary thyroid cancer or multiple endocrine neoplasia type II.   Last BMI/Weight/Height recorded Estimated body mass index is 42.81 kg/m as calculated from the following:   Height as of 09/22/23: 5' 9.02" (1.753 m).   Weight as of 09/22/23: 290 lb (131.5 kg).  Hadassah Letters, MD

## 2023-10-06 NOTE — Telephone Encounter (Signed)
 In process with insurance currently. Should receive answer via fax.

## 2023-10-24 MED ORDER — VARENICLINE TARTRATE 1 MG PO TABS: 1.0000 mg | ORAL_TABLET | Freq: Two times a day (BID) | ORAL | 1 refills | Status: DC

## 2023-11-02 ENCOUNTER — Other Ambulatory Visit: Payer: Self-pay | Admitting: Pediatrics

## 2023-11-02 DIAGNOSIS — Z6841 Body Mass Index (BMI) 40.0 and over, adult: Secondary | ICD-10-CM

## 2023-11-02 DIAGNOSIS — G4733 Obstructive sleep apnea (adult) (pediatric): Secondary | ICD-10-CM

## 2023-11-03 NOTE — Telephone Encounter (Signed)
 Requested Prescriptions  Pending Prescriptions Disp Refills   Semaglutide -Weight Management (WEGOVY ) 0.25 MG/0.5ML SOAJ [Pharmacy Med Name: WEGOVY  0.25 MG/0.5 ML PEN] 2 mL 0    Sig: INJECT 0.25MG  INTO THE SKIN ONE TIME PER WEEK     Endocrinology:  Diabetes - GLP-1 Receptor Agonists - semaglutide  Passed - 11/03/2023  1:53 PM      Passed - HBA1C in normal range and within 180 days    Hgb A1c MFr Bld  Date Value Ref Range Status  08/23/2023 5.0 4.8 - 5.6 % Final    Comment:             Prediabetes: 5.7 - 6.4          Diabetes: >6.4          Glycemic control for adults with diabetes: <7.0          Passed - Cr in normal range and within 360 days    Creatinine, Ser  Date Value Ref Range Status  08/23/2023 1.10 0.76 - 1.27 mg/dL Final         Passed - Valid encounter within last 6 months    Recent Outpatient Visits           1 month ago Hypogonadism male   [place] [facility] Hadassah Letters, MD   2 months ago Annual physical exam   [facility] Hadassah Letters, MD   2 months ago OSA (obstructive sleep apnea)   [place] [facility] Hadassah Letters, MD

## 2023-11-09 ENCOUNTER — Other Ambulatory Visit: Payer: Self-pay | Admitting: Pediatrics

## 2023-11-09 DIAGNOSIS — G4733 Obstructive sleep apnea (adult) (pediatric): Secondary | ICD-10-CM

## 2023-11-09 DIAGNOSIS — Z6841 Body Mass Index (BMI) 40.0 and over, adult: Secondary | ICD-10-CM

## 2023-11-09 MED ORDER — SEMAGLUTIDE-WEIGHT MANAGEMENT 0.5 MG/0.5ML ~~LOC~~ SOAJ: 0.5000 mg | SUBCUTANEOUS | 0 refills | Status: AC

## 2023-11-09 MED ORDER — SEMAGLUTIDE-WEIGHT MANAGEMENT 1 MG/0.5ML ~~LOC~~ SOAJ
1.0000 mg | SUBCUTANEOUS | 0 refills | Status: AC
Start: 2023-12-09 — End: 2024-01-06

## 2023-11-09 MED ORDER — SEMAGLUTIDE-WEIGHT MANAGEMENT 2.4 MG/0.75ML ~~LOC~~ SOAJ: 2.4000 mg | SUBCUTANEOUS | 0 refills | Status: DC

## 2023-11-09 MED ORDER — SEMAGLUTIDE-WEIGHT MANAGEMENT 1.7 MG/0.75ML ~~LOC~~ SOAJ: 1.7000 mg | SUBCUTANEOUS | 0 refills | Status: DC

## 2023-11-09 NOTE — Progress Notes (Signed)
 Sent all fills for wegovy   Hadassah Letters, MD

## 2023-11-14 ENCOUNTER — Encounter (INDEPENDENT_AMBULATORY_CARE_PROVIDER_SITE_OTHER): Payer: Self-pay | Admitting: Otolaryngology

## 2023-11-14 ENCOUNTER — Ambulatory Visit (INDEPENDENT_AMBULATORY_CARE_PROVIDER_SITE_OTHER): Payer: MEDICAID | Admitting: Otolaryngology

## 2023-11-14 VITALS — BP 115/70 | HR 70 | Ht 69.0 in | Wt 288.4 lb

## 2023-11-14 DIAGNOSIS — F17290 Nicotine dependence, other tobacco product, uncomplicated: Secondary | ICD-10-CM | POA: Diagnosis not present

## 2023-11-14 DIAGNOSIS — F17200 Nicotine dependence, unspecified, uncomplicated: Secondary | ICD-10-CM

## 2023-11-14 DIAGNOSIS — G4733 Obstructive sleep apnea (adult) (pediatric): Secondary | ICD-10-CM | POA: Diagnosis not present

## 2023-11-14 DIAGNOSIS — J343 Hypertrophy of nasal turbinates: Secondary | ICD-10-CM | POA: Diagnosis not present

## 2023-11-14 DIAGNOSIS — R0981 Nasal congestion: Secondary | ICD-10-CM | POA: Diagnosis not present

## 2023-11-14 DIAGNOSIS — J3089 Other allergic rhinitis: Secondary | ICD-10-CM

## 2023-11-14 DIAGNOSIS — J3489 Other specified disorders of nose and nasal sinuses: Secondary | ICD-10-CM

## 2023-11-14 DIAGNOSIS — J342 Deviated nasal septum: Secondary | ICD-10-CM | POA: Diagnosis not present

## 2023-11-14 MED ORDER — AZELASTINE HCL 0.1 % NA SOLN: 2.0000 | Freq: Two times a day (BID) | NASAL | 12 refills | Status: AC

## 2023-11-14 NOTE — Progress Notes (Signed)
 Dear Dr. Juliette Oh, Here is my assessment for our mutual patient, Richard Osborn. Thank you for allowing me the opportunity to care for your patient. Please do not hesitate to contact me should you have any other questions. Sincerely, Dr. Milon Aloe  Otolaryngology Clinic Note Referring provider: Dr. Juliette Oh HPI:  Richard Osborn is a 48 y.o. male kindly referred by Dr. Juliette Oh for evaluation of nasal obstruction, obstructive sleep apnea.  Initial visit (10/2023): Had surgery with Dr. Odean Bend for septoplasty in 2005 for nasal septal deviation and did well but still noting right sided nasal obstruction. Noted constantly blowing my nose but no frequent sinus infections (denies facial pressure/pain, discolored drainage, hyposmia). Does report some AR symptoms - eye watering, sneezing -- but has improved after zyrtec  and flonase . Tried neti pot before, has not helped. He also has been diagnosed with OSA (since his 48s) and is currently on a CPAP - uses full face mask and tolerates it well.  Trying to quit smoking - on chantix  Does report nasal trauma a few years ago where he broke his nose.  NOSE: 80/100  H&N Surgery: Septoplasty (2005) Personal or FHx of bleeding dz or anesthesia difficulty: no  GLP-1: Wegovy  AP/AC: Xarelto   Tobacco: vaping  PMHx: OSA on CPAP, PE on Xarelto , AR, Hypogonadism, ar  Independent Review of Additional Tests or Records:  Dr. Juliette Oh (08/22/2023): noted OSA on CPAP, difficulty breathing due to deviated septum s/p correction 2005, with nasal obstruction right, noted broken nose since surgery?; using full face mask; noted allergy symptoms including sneezing and watery eyes, on benadryl and zyrtec ; Dx: OSA, 70% compliance, ref to ENT; zyrtec  and flonase  CBC and CMP (03/29/2023): WBC 10.2, Eos 200, BUN/Cr 28/1.07 CTH 06/22/2020 independently interpreted: septum deviates right significantly dorsally and caudally; otherwise no significant sinonasal  opacification PMH/Meds/All/SocHx/FamHx/ROS:   Past Medical History:  Diagnosis Date   Depressed    DVT (deep venous thrombosis) (HCC) 01/2022   right leg   History of pulmonary embolism 2020   bilateral   Sleep apnea    Substance abuse (HCC)    Alcohol     Past Surgical History:  Procedure Laterality Date   arm surgery      HEEL SPUR SURGERY     ~2008   NASAL SEPTUM SURGERY     ORIF ULNAR / RADIAL SHAFT FRACTURE      Family History  Problem Relation Age of Onset   Healthy Mother    Healthy Father    Diabetes Maternal Grandmother    Alzheimer's disease Maternal Grandmother    Other Maternal Grandfather        unknown medical history   Emphysema Paternal Grandmother    Other Paternal Grandfather        unknown medical history   Diabetes Other        grandmother     Social Connections: Unknown (10/01/2021)   Received from [employer]   Social Network    Social Network: Not on file      Current Outpatient Medications:    azelastine (ASTELIN) 0.1 % nasal spray, Place 2 sprays into both nostrils 2 (two) times daily. Use in each nostril as directed, Disp: 30 mL, Rfl: 12   cetirizine  (ZYRTEC ) 10 MG tablet, Take 1 tablet (10 mg total) by mouth daily., Disp: 30 tablet, Rfl: 11   fluticasone  (FLONASE ) 50 MCG/ACT nasal spray, Place 2 sprays into both nostrils daily., Disp: 16 g, Rfl: 6   furosemide  (LASIX ) 20 MG tablet, Take 1 tablet (  20 mg total) by mouth daily as needed for edema., Disp: 30 tablet, Rfl: 3   nicotine  polacrilex (NICORETTE ) 2 MG gum, Take 1 each (2 mg total) by mouth as needed for smoking cessation., Disp: 100 tablet, Rfl: 1   rivaroxaban  (XARELTO ) 20 MG TABS tablet, Take 1 tablet (20 mg total) by mouth daily., Disp: 90 tablet, Rfl: 3   Semaglutide -Weight Management (WEGOVY ) 0.25 MG/0.5ML SOAJ, INJECT 0.25MG  INTO THE SKIN ONE TIME PER WEEK, Disp: 2 mL, Rfl: 0   Semaglutide -Weight Management 0.5 MG/0.5ML SOAJ, Inject 0.5 mg into the skin once a week for 28  days., Disp: 2 mL, Rfl: 0   [START ON 12/09/2023] Semaglutide -Weight Management 1 MG/0.5ML SOAJ, Inject 1 mg into the skin once a week for 28 days., Disp: 2 mL, Rfl: 0   [START ON 01/08/2024] Semaglutide -Weight Management 1.7 MG/0.75ML SOAJ, Inject 1.7 mg into the skin once a week for 28 days., Disp: 3 mL, Rfl: 0   [START ON 02/07/2024] Semaglutide -Weight Management 2.4 MG/0.75ML SOAJ, Inject 2.4 mg into the skin once a week for 28 days., Disp: 3 mL, Rfl: 0   testosterone  cypionate (DEPOTESTOSTERONE CYPIONATE) 200 MG/ML injection, Inject 0.75 mLs (150 mg total) into the muscle every 14 (fourteen) days., Disp: 10 mL, Rfl: 3   varenicline  (CHANTIX ) 1 MG tablet, Take 1 tablet (1 mg total) by mouth 2 (two) times daily., Disp: 180 tablet, Rfl: 1   tadalafil (CIALIS) 10 MG tablet, Take by mouth. (Patient not taking: Reported on 11/14/2023), Disp: , Rfl:    Physical Exam:   BP 115/70   Pulse 70   Ht 5' 9 (1.753 m)   Wt 288 lb 6.4 oz (130.8 kg)   SpO2 94%   BMI 42.59 kg/m   Salient findings:  CN II-XII intact Bilateral EAC clear and TM intact with well pneumatized middle ear spaces Anterior rhinoscopy: Septum deviates right - obstructs approx 60% of airway; bilateral inferior turbinates reduced; mod cottle mild pos, dorsum relatively midline, palpable ULC relatively weak; Nasal endoscopy was indicated to better evaluate the nose and paranasal sinuses, given the patient's history and exam findings, and is detailed below. No lesions of oral cavity/oropharynx No obviously palpable neck masses/lymphadenopathy/thyromegaly No respiratory distress or stridor  Seprately Identifiable Procedures:  Prior to initiating any procedures, risks/benefits/alternatives were explained to the patient and verbal consent obtained. PROCEDURE: Bilateral Diagnostic Rigid Nasal Endoscopy Pre-procedure diagnosis: Nasal congestion, nasal obstruction Post-procedure diagnosis: same Indication: See pre-procedure diagnosis and  physical exam above Complications: None apparent EBL: 0 mL Anesthesia: Lidocaine  4% and topical decongestant was topically sprayed in each nasal cavity  Description of Procedure:  Patient was identified. A rigid 30 degree endoscope was utilized to evaluate the sinonasal cavities, mucosa, sinus ostia and turbinates and septum.  Overall, signs of mucosal inflammation are not noted.  Also noted are postsurgical changes with absent cartilage likely mid-septum with small <8mm septal perforation, clean.  No mucopurulence, polyps, or masses noted.   Right Middle meatus: clear Right SE Recess: clear Left MM: clear Left SE Recess: clear  CPT CODE -- 78469 - Mod 25   Electronically signed by: Evelina Hippo, MD 11/14/2023 9:00 AM   Impression & Plans:  Richard Osborn is a 48 y.o. male with:  1. Nasal congestion   2. Hypertrophy of both inferior nasal turbinates   3. Nasal septal deviation   4. Nasal obstruction   5. OSA (obstructive sleep apnea)   6. Perennial allergic rhinitis    Noted slowly worsening  nasal symptoms especially on right after trauma. H/o septo/turbs in 2005. We discussed options - at this point, his likely best option is open septorhinoplasty with valve repair. Do not think he'd need osteotomies. We discussed what this entails and patient will consider it. If he wishes to proceed, would have him quit tobacco which he is actively attempting.  Given the patient's tobacco use, I also discussed cessation and options for cessation, including counseling. Counseled patient on the dangers of tobacco use, and reviewed strategies to maximize success. Patient is ready to quit, and is undergoing treatment. Total time spent with this was 4 minutes.    In interim, increase flonase  to BID and add astelin BID for AR sx Consider breathe right strips at night  F/u if he wishes to proceed  See below regarding exact medications prescribed this encounter including dosages and route: Meds  ordered this encounter  Medications   azelastine (ASTELIN) 0.1 % nasal spray    Sig: Place 2 sprays into both nostrils 2 (two) times daily. Use in each nostril as directed    Dispense:  30 mL    Refill:  12      Thank you for allowing me the opportunity to care for your patient. Please do not hesitate to contact me should you have any other questions.  Sincerely, Milon Aloe, MD Otolaryngologist (ENT), [facility] Phone: [phone] Fax: [phone]  11/14/2023, 9:00 AM   MDM:  Level 4 - [phone] Complexity/Problems addressed: mod - chronic problems, worsening Data complexity: high - independent review of notes, labs; independent CT imaging interpretation - Morbidity: mod  - Drug prescribed or managed: y

## 2023-11-22 ENCOUNTER — Ambulatory Visit (INDEPENDENT_AMBULATORY_CARE_PROVIDER_SITE_OTHER): Payer: MEDICAID | Admitting: Pediatrics

## 2023-11-22 ENCOUNTER — Encounter: Payer: Self-pay | Admitting: Pediatrics

## 2023-11-22 VITALS — BP 131/82 | HR 65 | Temp 97.6°F | Wt 286.6 lb

## 2023-11-22 DIAGNOSIS — Z6841 Body Mass Index (BMI) 40.0 and over, adult: Secondary | ICD-10-CM

## 2023-11-22 DIAGNOSIS — F17210 Nicotine dependence, cigarettes, uncomplicated: Secondary | ICD-10-CM | POA: Diagnosis not present

## 2023-11-22 DIAGNOSIS — R112 Nausea with vomiting, unspecified: Secondary | ICD-10-CM

## 2023-11-22 DIAGNOSIS — G4733 Obstructive sleep apnea (adult) (pediatric): Secondary | ICD-10-CM | POA: Diagnosis not present

## 2023-11-22 DIAGNOSIS — E291 Testicular hypofunction: Secondary | ICD-10-CM

## 2023-11-22 DIAGNOSIS — G2581 Restless legs syndrome: Secondary | ICD-10-CM | POA: Insufficient documentation

## 2023-11-22 MED ORDER — NICOTINE POLACRILEX 2 MG MT GUM: 2.0000 mg | CHEWING_GUM | OROMUCOSAL | 1 refills | Status: DC | PRN

## 2023-11-22 MED ORDER — ONDANSETRON HCL 4 MG PO TABS: 4.0000 mg | ORAL_TABLET | Freq: Three times a day (TID) | ORAL | 0 refills | Status: DC | PRN

## 2023-11-22 NOTE — Assessment & Plan Note (Signed)
 Stopped vaping with Chantix , using nicotine  gum. Slow cessation process acknowledged. - Prescribe more nicotine  gum.

## 2023-11-22 NOTE — Patient Instructions (Signed)
 Daily miralax for constipation

## 2023-11-22 NOTE — Assessment & Plan Note (Signed)
 Worsening symptoms affecting sleep. Suspected low iron levels. Gabapentin considered if symptoms persist. - Check iron levels. - Consider iron supplementation if low. - Recommend stretching exercises. - Consider gabapentin if symptoms persist.

## 2023-11-22 NOTE — Progress Notes (Signed)
 Office Visit  BP 131/82   Pulse 65   Temp 97.6 F (36.4 C) (Oral)   Wt 286 lb 9.6 oz (130 kg)   SpO2 98%   BMI 42.32 kg/m    Subjective:    Patient ID: Richard Osborn, male    DOB: 1975/07/16, 48 y.o.   MRN: [id number]  HPI: Richard Osborn is a 48 y.o. male  Chief Complaint  Patient presents with   Follow-up    Discussed the use of AI scribe software for clinical note transcription with the patient, who gave verbal consent to proceed.  History of Present Illness   Richard Osborn is a 48 year old male who presents with side effects from Wegovy  treatment for weight management.  Richard Osborn is experiencing nausea and constipation as side effects of Wegovy , which Richard Osborn describes as 'pretty bad' at times. His wife has been assisting him with over-the-counter remedies, providing some relief. Richard Osborn is on the 1 mg dose and has two more weeks before increasing the dose. Nausea occurs during the day and at night, and Richard Osborn experiences a return of hunger at night. Despite these side effects, Richard Osborn feels that the medication is helping.  Richard Osborn is on testosterone  therapy, administered every other Monday. Richard Osborn reports not feeling as good on the lower dose and experiences a 'roller coaster' effect due to the longer duration between injections. His last dose was taken last Monday.  Richard Osborn has been experiencing worsening restless leg syndrome, affecting his sleep. Richard Osborn describes having a hard time going to sleep and an 'unbelievable urge' to move his legs. Richard Osborn is unsure if his iron levels have been checked recently.  Richard Osborn has a history of vaping and is currently using nicotine  gum as a substitute. Richard Osborn has not vaped in a month and is using the gum as a 'crutch' to avoid vaping. 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        Relevant past medical, surgical, family and social history reviewed and updated as indicated. Interim medical  history since our last visit reviewed. Allergies and medications reviewed and updated.  ROS per HPI unless specifically indicated above     Objective:    BP 131/82   Pulse 65   Temp 97.6 F (36.4 C) (Oral)   Wt 286 lb 9.6 oz (130 kg)   SpO2 98%   BMI 42.32 kg/m   Wt Readings from Last 3 Encounters:  11/22/23 286 lb 9.6 oz (130 kg)  11/14/23 288 lb 6.4 oz (130.8 kg)  09/22/23 290 lb (131.5 kg)     Physical Exam Constitutional:      Appearance: Normal appearance.  Pulmonary:     Effort: Pulmonary effort is normal.   Musculoskeletal:        General: Normal range of motion.   Skin:    Comments: Normal skin color   Neurological:     General: No focal deficit present.     Mental Status: Richard Osborn is alert. Mental status is at baseline.   Psychiatric:        Mood and Affect: Mood normal.        Behavior: Behavior normal.        Thought Content: Thought content normal.         11/22/2023    8:13 AM 09/22/2023    8:15 AM 08/29/2023    8:29 AM 08/22/2023    1:55  PM 06/23/2022   11:10 AM  Depression screen PHQ 2/9  Decreased Interest 1 1 1 1  0  Down, Depressed, Hopeless 1 1 1 1 1   PHQ - 2 Score 2 2 2 2 1   Altered sleeping 2 2 2 2    Tired, decreased energy 2 1 1  0   Change in appetite 1 0 1 1   Feeling bad or failure about yourself  1 1 1 1    Trouble concentrating 1 0 0 1   Moving slowly or fidgety/restless 0 0 0 0   Suicidal thoughts 0 0  0   PHQ-9 Score 9 6 7 7    Difficult doing work/chores Somewhat difficult Not difficult at all Not difficult at all Not difficult at all        11/22/2023    8:14 AM 09/22/2023    8:15 AM 08/29/2023    8:29 AM 08/22/2023    1:56 PM  GAD 7 : Generalized Anxiety Score  Nervous, Anxious, on Edge 1 2 1 1   Control/stop worrying 2 1 1 1   Worry too much - different things 2 1 1 1   Trouble relaxing 1 1 1 1   Restless 0 0 0 1  Easily annoyed or irritable 2 2 2 2   Afraid - awful might happen 1 1 1 1   Total GAD 7 Score 9 8 7 8   Anxiety  Difficulty Somewhat difficult Somewhat difficult Not difficult at all Not difficult at all       Assessment & Plan:  Assessment & Plan   Hypogonadism male Assessment & Plan: Suboptimal well-being on current testosterone  dose. Fluctuations subjectively due to biweekly dosing. Plan to assess testosterone  levels. - Check testosterone  levels. - Monitor symptoms, adjust dosing if necessary. - Check liver function tests.  Orders: -     Testosterone ,Free and Total  Cigarette nicotine  dependence without complication Assessment & Plan: Stopped vaping with Chantix , using nicotine  gum. Slow cessation process acknowledged. - Prescribe more nicotine  gum.  Orders: -     Nicotine  Polacrilex; Take 1 each (2 mg total) by mouth as needed for smoking cessation.  Dispense: 100 tablet; Refill: 1  Restless legs Assessment & Plan: Worsening symptoms affecting sleep. Suspected low iron levels. Gabapentin considered if symptoms persist. - Check iron levels. - Consider iron supplementation if low. - Recommend stretching exercises. - Consider gabapentin if symptoms persist.  Orders: -     Iron, TIBC and Ferritin Panel -     CBC with Differential/Platelet  BMI 40.0-44.9, adult (HCC) OSA (obstructive sleep apnea) Nausea and vomiting, unspecified vomiting type Assessment & Plan: On Wegovy  1 mg with minimal weight loss. Nausea and constipation reported. Higher doses may enhance weight loss. Consider Zepbound  if ineffective. Side effects expected to improve. Informed consent obtained. - Continue Wegovy , monitor weight loss and side effects. - Consider Zepbound  if no significant weight loss. - Offer antiemetic for nausea. - Recommend daily Miralax for constipation. - Encourage hydration and physical activity. -     Ondansetron  HCl; Take 1 tablet (4 mg total) by mouth every 8 (eight) hours as needed for nausea or vomiting.  Dispense: 20 tablet; Refill: 0 -     Comprehensive metabolic panel with  GFR    Follow up plan: Return in about 2 months (around 01/22/2024).  Hadassah SHAUNNA Nett, MD

## 2023-11-22 NOTE — Assessment & Plan Note (Signed)
 On Wegovy  1 mg with minimal weight loss. Nausea and constipation reported. Higher doses may enhance weight loss. Consider Zepbound  if ineffective. Side effects expected to improve. Informed consent obtained. - Continue Wegovy , monitor weight loss and side effects. - Consider Zepbound  if no significant weight loss. - Offer antiemetic for nausea. - Recommend daily Miralax for constipation. - Encourage hydration and physical activity.

## 2023-11-22 NOTE — Assessment & Plan Note (Signed)
 Suboptimal well-being on current testosterone  dose. Fluctuations due to biweekly dosing. Plan to assess testosterone  levels. - Check testosterone  levels. - Monitor symptoms, adjust dosing if necessary. - Check liver function tests.

## 2023-11-23 LAB — TESTOSTERONE,FREE AND TOTAL

## 2023-11-24 ENCOUNTER — Ambulatory Visit: Payer: Self-pay | Admitting: Pediatrics

## 2023-11-24 LAB — CBC WITH DIFFERENTIAL/PLATELET
Basophils Absolute: 0.1 10*3/uL (ref 0.0–0.2)
Basos: 1 %
EOS (ABSOLUTE): 0.2 10*3/uL (ref 0.0–0.4)
Eos: 2 %
Hematocrit: 56.6 % — ABNORMAL HIGH (ref 37.5–51.0)
Hemoglobin: 18.1 g/dL — ABNORMAL HIGH (ref 13.0–17.7)
Immature Grans (Abs): 0 10*3/uL (ref 0.0–0.1)
Immature Granulocytes: 0 %
Lymphocytes Absolute: 2 10*3/uL (ref 0.7–3.1)
Lymphs: 23 %
MCH: 31.7 pg (ref 26.6–33.0)
MCHC: 32 g/dL (ref 31.5–35.7)
MCV: 99 fL — ABNORMAL HIGH (ref 79–97)
Monocytes Absolute: 1 10*3/uL — ABNORMAL HIGH (ref 0.1–0.9)
Monocytes: 12 %
Neutrophils Absolute: 5.4 10*3/uL (ref 1.4–7.0)
Neutrophils: 62 %
Platelets: 195 10*3/uL (ref 150–450)
RBC: 5.71 x10E6/uL (ref 4.14–5.80)
RDW: 13.8 % (ref 11.6–15.4)
WBC: 8.8 10*3/uL (ref 3.4–10.8)

## 2023-11-24 LAB — COMPREHENSIVE METABOLIC PANEL WITH GFR
ALT: 20 IU/L (ref 0–44)
AST: 21 IU/L (ref 0–40)
Albumin: 4 g/dL — ABNORMAL LOW (ref 4.1–5.1)
Alkaline Phosphatase: 68 IU/L (ref 44–121)
BUN/Creatinine Ratio: 15 (ref 9–20)
BUN: 18 mg/dL (ref 6–24)
Bilirubin Total: 1.2 mg/dL (ref 0.0–1.2)
CO2: 23 mmol/L (ref 20–29)
Calcium: 8.9 mg/dL (ref 8.7–10.2)
Chloride: 103 mmol/L (ref 96–106)
Creatinine, Ser: 1.2 mg/dL (ref 0.76–1.27)
Globulin, Total: 2.9 g/dL (ref 1.5–4.5)
Glucose: 94 mg/dL (ref 70–99)
Potassium: 4.7 mmol/L (ref 3.5–5.2)
Sodium: 139 mmol/L (ref 134–144)
Total Protein: 6.9 g/dL (ref 6.0–8.5)
eGFR: 75 mL/min/{1.73_m2} (ref >59)

## 2023-11-24 LAB — IRON,TIBC AND FERRITIN PANEL
Ferritin: 265 ng/mL (ref 30–400)
Iron Saturation: 27 % (ref 15–55)
Iron: 81 ug/dL (ref 38–169)
Total Iron Binding Capacity: 298 ug/dL (ref 250–450)
UIBC: 217 ug/dL (ref 111–343)

## 2023-11-24 LAB — TESTOSTERONE,FREE AND TOTAL: Testosterone: 417 ng/dL (ref 264–916)

## 2023-12-03 ENCOUNTER — Other Ambulatory Visit: Payer: Self-pay | Admitting: Pediatrics

## 2023-12-03 ENCOUNTER — Telehealth: Payer: MEDICAID | Admitting: Emergency Medicine

## 2023-12-03 DIAGNOSIS — T63481A Toxic effect of venom of other arthropod, accidental (unintentional), initial encounter: Secondary | ICD-10-CM

## 2023-12-03 DIAGNOSIS — G4733 Obstructive sleep apnea (adult) (pediatric): Secondary | ICD-10-CM

## 2023-12-03 DIAGNOSIS — Z6841 Body Mass Index (BMI) 40.0 and over, adult: Secondary | ICD-10-CM

## 2023-12-03 DIAGNOSIS — L03115 Cellulitis of right lower limb: Secondary | ICD-10-CM

## 2023-12-03 MED ORDER — CEPHALEXIN 500 MG PO CAPS: 500.0000 mg | ORAL_CAPSULE | Freq: Four times a day (QID) | ORAL | 0 refills | Status: AC

## 2023-12-03 MED ORDER — PREDNISONE 20 MG PO TABS: 40.0000 mg | ORAL_TABLET | Freq: Every day | ORAL | 0 refills | Status: AC

## 2023-12-03 NOTE — Progress Notes (Signed)
 E-Visit for Insect Sting  Thank you for describing the insect sting for us .  Here is how we plan to help!  The 2 greatest risks from insect stings are allergic reaction, which can be fatal in some people and infection, which is more common and less serious.  Bees, wasps, yellow jackets, and hornets belong to a class of insects called Hymenoptera.  Most insect stings cause only minor discomfort.  Stings can happen anywhere on the body and can be painful.  Most stings are from honey bees or yellow jackets.  Fire ants can sting multiple times.  The sites of the stings are more likely to become infected.    I have sent in prednisone  40 mg by mouth daily for 5 days to the pharmacy you selected.  Please make sure that you selected a pharmacy that is open now. I have also sent in a course of antibiotics. Since it is becoming more and more painful, I am worried about a skin infection from the sting.   What can be used to prevent Insect Stings?  Insect repellant with at least 20% DEET.  Wearing long pants and shirts with socks and shoes.  Wear dark or drab-colored clothes rather than bright colors.  Avoid using perfumes and hair sprays; these attract insects.  HOME CARE ADVICE:  1. Stinger removal: The stinger looks like a tiny black dot in the sting. Use a fingernail, credit card edge, or knife-edge to scrape it off.  Don't pull it out because it squeezes out more venom. If the stinger is below the skin surface, leave it alone.  It will be shed with normal skin healing. 2. Use cold compresses to the area of the sting for 10-20 minutes.  You may repeat this as needed to relieve symptoms of pain and swelling. 3.  For pain relief, take acetominophen 650 mg 4-6 hours as needed or ibuprofen 400 mg every 6-8 hours as needed or naproxen 250-500 mg every 12 hours as needed. 4.  You can also use hydrocortisone cream 0.5% or 1% up to 4 times daily as needed for itching. 5.  If the sting becomes very  itchy, take Benadryl 25-50 mg, follow directions on box. 6.  Wash the area 2-3 times daily with antibacterial soap and warm water. 7. Call your Doctor if: Fever, a severe headache, or rash occur in the next 2 weeks. Sting area begins to look infected. Redness and swelling worsens after home treatment. Your current symptoms become worse.    MAKE SURE YOU:  Understand these instructions. Will watch your condition. Will get help right away if you are not doing well or get worse.  Thank you for choosing an e-visit.  Your e-visit answers were reviewed by a board certified advanced clinical practitioner to complete your personal care plan. Depending upon the condition, your plan could have included both over the counter or prescription medications.  Please review your pharmacy choice. Make sure the pharmacy is open so you can pick up prescription now. If there is a problem, you may contact your provider through [employer] and have the prescription routed to another pharmacy.  Your safety is important to us . If you have drug allergies check your prescription carefully.   For the next 24 hours you can use MyChart to ask questions about today's visit, request a non-urgent call back, or ask for a work or school excuse. You will get an email in the next two days asking about your experience. I hope  that your e-visit has been valuable and will speed your recovery.  I have spent 5 minutes in review of e-visit questionnaire, review and updating patient chart, medical decision making and response to patient.   Jon Belt, PhD, FNP-BC

## 2023-12-05 NOTE — Telephone Encounter (Signed)
 Next dose already ordered to start 12/09/23 1 mg x 28 days  Requested Prescriptions  Refused Prescriptions Disp Refills   WEGOVY  0.5 MG/0.5ML SOAJ [Pharmacy Med Name: WEGOVY  0.5 MG/0.5 ML PEN]      Sig: INJECT 0.5 MG INTO THE SKIN ONCE A WEEK FOR 28 DAYS.     Endocrinology:  Diabetes - GLP-1 Receptor Agonists - semaglutide  Passed - 12/05/2023  3:24 PM      Passed - HBA1C in normal range and within 180 days    Hgb A1c MFr Bld  Date Value Ref Range Status  08/23/2023 5.0 4.8 - 5.6 % Final    Comment:             Prediabetes: 5.7 - 6.4          Diabetes: >6.4          Glycemic control for adults with diabetes: <7.0          Passed - Cr in normal range and within 360 days    Creatinine, Ser  Date Value Ref Range Status  11/22/2023 1.20 0.76 - 1.27 mg/dL Final         Passed - Valid encounter within last 6 months    Recent Outpatient Visits           1 week ago Hypogonadism male   [place] [facility] Herold Hadassah SQUIBB, MD   2 months ago Hypogonadism male   [place] [facility] Herold Hadassah SQUIBB, MD   3 months ago Annual physical exam   [place] [facility] Herold Hadassah SQUIBB, MD   3 months ago OSA (obstructive sleep apnea)   [place] [facility] Herold Hadassah SQUIBB, MD

## 2024-01-15 ENCOUNTER — Other Ambulatory Visit: Payer: Self-pay | Admitting: Pediatrics

## 2024-01-15 DIAGNOSIS — Z6841 Body Mass Index (BMI) 40.0 and over, adult: Secondary | ICD-10-CM

## 2024-01-15 DIAGNOSIS — G4733 Obstructive sleep apnea (adult) (pediatric): Secondary | ICD-10-CM

## 2024-01-16 NOTE — Telephone Encounter (Signed)
 Requested medication (s) are due for refill today: Yes  Requested medication (s) are on the active medication list: Yes  Last refill:  11/09/23  Future visit scheduled: Yes  Notes to clinic:  Upcoming appointment, routing this request in case provider wants to refill prior to appointment, due for a f/u     Requested Prescriptions  Pending Prescriptions Disp Refills   WEGOVY  1.7 MG/0.75ML SOAJ SQ injection [Pharmacy Med Name: WEGOVY  1.7 MG/0.75 ML PEN]      Sig: INJECT 1.7 MG INTO THE SKIN ONCE A WEEK FOR 28 DAYS.     Endocrinology:  Diabetes - GLP-1 Receptor Agonists - semaglutide  Passed - 01/16/2024  5:49 PM      Passed - HBA1C in normal range and within 180 days    Hgb A1c MFr Bld  Date Value Ref Range Status  08/23/2023 5.0 4.8 - 5.6 % Final    Comment:             Prediabetes: 5.7 - 6.4          Diabetes: >6.4          Glycemic control for adults with diabetes: <7.0          Passed - Cr in normal range and within 360 days    Creatinine, Ser  Date Value Ref Range Status  11/22/2023 1.20 0.76 - 1.27 mg/dL Final         Passed - Valid encounter within last 6 months    Recent Outpatient Visits           1 month ago Hypogonadism male   [place] [facility] Herold Hadassah SQUIBB, MD   3 months ago Hypogonadism male   [place] [facility] Herold Hadassah SQUIBB, MD   4 months ago Annual physical exam   [place] [facility] Herold Hadassah SQUIBB, MD   4 months ago OSA (obstructive sleep apnea)   [place] [facility] Herold Hadassah SQUIBB, MD

## 2024-01-20 ENCOUNTER — Other Ambulatory Visit: Payer: Self-pay | Admitting: Pediatrics

## 2024-01-20 DIAGNOSIS — R600 Localized edema: Secondary | ICD-10-CM

## 2024-01-22 NOTE — Telephone Encounter (Signed)
 Requested Prescriptions  Pending Prescriptions Disp Refills   furosemide  (LASIX ) 20 MG tablet [Pharmacy Med Name: FUROSEMIDE  20 MG TABLET] 90 tablet 0    Sig: TAKE 1 TABLET (20 MG TOTAL) BY MOUTH DAILY AS NEEDED FOR EDEMA.     Cardiovascular:  Diuretics - Loop Failed - 01/22/2024  2:48 PM      Failed - Mg Level in normal range and within 180 days    No results found for: MG       Passed - K in normal range and within 180 days    Potassium  Date Value Ref Range Status  11/22/2023 4.7 3.5 - 5.2 mmol/L Final         Passed - Ca in normal range and within 180 days    Calcium  Date Value Ref Range Status  11/22/2023 8.9 8.7 - 10.2 mg/dL Final         Passed - Na in normal range and within 180 days    Sodium  Date Value Ref Range Status  11/22/2023 139 134 - 144 mmol/L Final         Passed - Cr in normal range and within 180 days    Creatinine, Ser  Date Value Ref Range Status  11/22/2023 1.20 0.76 - 1.27 mg/dL Final         Passed - Cl in normal range and within 180 days    Chloride  Date Value Ref Range Status  11/22/2023 103 96 - 106 mmol/L Final         Passed - Last BP in normal range    BP Readings from Last 1 Encounters:  11/22/23 131/82         Passed - Valid encounter within last 6 months    Recent Outpatient Visits           2 months ago Hypogonadism male   [place] [facility] Herold Hadassah SQUIBB, MD   4 months ago Hypogonadism male   [place] [facility] Herold Hadassah SQUIBB, MD   4 months ago Annual physical exam   [place] [facility] Herold Hadassah SQUIBB, MD   5 months ago OSA (obstructive sleep apnea)   [place] [facility] Herold Hadassah SQUIBB, MD

## 2024-01-23 ENCOUNTER — Ambulatory Visit (INDEPENDENT_AMBULATORY_CARE_PROVIDER_SITE_OTHER): Payer: MEDICAID | Admitting: Pediatrics

## 2024-01-23 ENCOUNTER — Encounter: Payer: Self-pay | Admitting: Pediatrics

## 2024-01-23 VITALS — BP 126/77 | HR 78 | Temp 97.5°F | Wt 270.4 lb

## 2024-01-23 DIAGNOSIS — R399 Unspecified symptoms and signs involving the genitourinary system: Secondary | ICD-10-CM | POA: Insufficient documentation

## 2024-01-23 DIAGNOSIS — F17210 Nicotine dependence, cigarettes, uncomplicated: Secondary | ICD-10-CM | POA: Diagnosis not present

## 2024-01-23 DIAGNOSIS — I2699 Other pulmonary embolism without acute cor pulmonale: Secondary | ICD-10-CM

## 2024-01-23 DIAGNOSIS — Z6839 Body mass index (BMI) 39.0-39.9, adult: Secondary | ICD-10-CM | POA: Insufficient documentation

## 2024-01-23 DIAGNOSIS — E291 Testicular hypofunction: Secondary | ICD-10-CM | POA: Diagnosis not present

## 2024-01-23 LAB — URINALYSIS, ROUTINE W REFLEX MICROSCOPIC
Bilirubin, UA: NEGATIVE
Glucose, UA: NEGATIVE
Leukocytes,UA: NEGATIVE
Nitrite, UA: NEGATIVE
RBC, UA: NEGATIVE
Specific Gravity, UA: 1.02 (ref 1.005–1.030)
Urobilinogen, Ur: 0.2 mg/dL (ref 0.2–1.0)
pH, UA: 6 (ref 5.0–7.5)

## 2024-01-23 MED ORDER — WEGOVY 2.4 MG/0.75ML ~~LOC~~ SOAJ
2.4000 mg | SUBCUTANEOUS | 3 refills | Status: AC
Start: 2024-01-23 — End: ?

## 2024-01-23 MED ORDER — TAMSULOSIN HCL 0.4 MG PO CAPS: 0.4000 mg | ORAL_CAPSULE | Freq: Every day | ORAL | 3 refills | Status: AC

## 2024-01-23 MED ORDER — NICOTINE POLACRILEX 2 MG MT GUM
2.0000 mg | CHEWING_GUM | OROMUCOSAL | 1 refills | Status: DC | PRN
Start: 2024-01-23 — End: 2024-04-10

## 2024-01-23 NOTE — Assessment & Plan Note (Signed)
 Significant weight loss over 40 pounds since February due to Wegovy  and lifestyle changes. No nausea with Wegovy . - Continue Wegovy  at 2.4 mg with refills for a year. - Encourage continued exercise and dietary modifications.

## 2024-01-23 NOTE — Assessment & Plan Note (Deleted)
 Stopped smoking and vaping, continues nicotine  gum. Experienced anxiety and mood changes with Chantix . - Prescribe refills for nicotine  gum.

## 2024-01-23 NOTE — Addendum Note (Signed)
 Addended by: Kelsie Zaborowski P on: 01/23/2024 09:35 AM   Modules accepted: Orders

## 2024-01-23 NOTE — Assessment & Plan Note (Signed)
 Stopped smoking and vaping, continues nicotine  gum. Experienced anxiety and mood changes with Chantix . - Prescribe refills for nicotine  gum.

## 2024-01-23 NOTE — Assessment & Plan Note (Signed)
 Reports low energy levels, similar to previous experiences. Previous blood donation may have affected blood count levels. - Order lab tests to reassess testosterone  levels. - Consider increasing testosterone  dosage based on lab results.

## 2024-01-23 NOTE — Progress Notes (Signed)
 Office Visit  BP 126/77   Pulse 78   Temp (!) 97.5 F (36.4 C) (Oral)   Wt 270 lb 6.4 oz (122.7 kg)   SpO2 98%   BMI 39.93 kg/m    Subjective:    Patient ID: Richard Osborn, male    DOB: 1975-08-21, 48 y.o.   MRN: [id number]  HPI: Richard Osborn is a 48 y.o. male  Chief Complaint  Patient presents with   Dysuria    Pt states he has been having a urination issue    Discussed the use of AI scribe software for clinical note transcription with the patient, who gave verbal consent to proceed.  History of Present Illness   Richard Osborn is a 48 year old male who presents with urinary symptoms and weight loss.  He has been experiencing urinary symptoms for the past one to two years, including decreased force during urination, particularly in the mornings, and occasional post-void dribbling. These symptoms occur sporadically and are more of a frustration than a constant issue. He has not been on any medication for this issue previously.  He has experienced significant weight loss, losing over 40 pounds since February when he joined a gym. He attributes this to a combination of medication and increased physical activity. He is currently on Wegovy , which helps with his appetite without causing nausea. He exercises five days a week, including 30 minutes on the elliptical and weight lifting, and maintains a diet of 2000 calories or less per day.  He has a history of using Chantix  for smoking cessation but stopped due to anxiety and feeling on edge. He has successfully quit smoking and vaping for several months but continues to use nicotine  gum.  He mentions a previous issue with elevated blood counts, which he managed by donating blood. He reports feeling low energy, similar to before, and is on testosterone  therapy, with plans to increase the dose.      Relevant past medical, surgical, family and social history reviewed and updated as indicated. Interim medical history since our last  visit reviewed. Allergies and medications reviewed and updated.  ROS per HPI unless specifically indicated above     Objective:    BP 126/77   Pulse 78   Temp (!) 97.5 F (36.4 C) (Oral)   Wt 270 lb 6.4 oz (122.7 kg)   SpO2 98%   BMI 39.93 kg/m   Wt Readings from Last 3 Encounters:  01/23/24 270 lb 6.4 oz (122.7 kg)  11/22/23 286 lb 9.6 oz (130 kg)  11/14/23 288 lb 6.4 oz (130.8 kg)     Physical Exam Constitutional:      Appearance: Normal appearance.  Pulmonary:     Effort: Pulmonary effort is normal.  Musculoskeletal:        General: Normal range of motion.  Skin:    Comments: Normal skin color  Neurological:     General: No focal deficit present.     Mental Status: He is alert. Mental status is at baseline.  Psychiatric:        Mood and Affect: Mood normal.        Behavior: Behavior normal.        Thought Content: Thought content normal.         01/23/2024    8:12 AM 11/22/2023    8:13 AM 09/22/2023    8:15 AM 08/29/2023    8:29 AM 08/22/2023    1:55 PM  Depression screen PHQ 2/9  Decreased Interest 1 1 1 1 1   Down, Depressed, Hopeless 1 1 1 1 1   PHQ - 2 Score 2 2 2 2 2   Altered sleeping 1 2 2 2 2   Tired, decreased energy 1 2 1 1  0  Change in appetite 1 1 0 1 1  Feeling bad or failure about yourself  1 1 1 1 1   Trouble concentrating 0 1 0 0 1  Moving slowly or fidgety/restless 0 0 0 0 0  Suicidal thoughts 0 0 0  0  PHQ-9 Score 6 9 6 7 7   Difficult doing work/chores Not difficult at all Somewhat difficult Not difficult at all Not difficult at all Not difficult at all       01/23/2024    8:13 AM 11/22/2023    8:14 AM 09/22/2023    8:15 AM 08/29/2023    8:29 AM  GAD 7 : Generalized Anxiety Score  Nervous, Anxious, on Edge 2 1 2 1   Control/stop worrying 1 2 1 1   Worry too much - different things 1 2 1 1   Trouble relaxing 1 1 1 1   Restless 0 0 0 0  Easily annoyed or irritable 2 2 2 2   Afraid - awful might happen 1 1 1 1   Total GAD 7 Score 8 9 8 7    Anxiety Difficulty Not difficult at all Somewhat difficult Somewhat difficult Not difficult at all       Assessment & Plan:  Assessment & Plan   Lower urinary tract symptoms (LUTS) Assessment & Plan: Urinary symptoms include dribbling and incomplete emptying. Discussed age-related prostate enlargement and BPH. Prostate cancer considered rare. - Prescribe Flomax  daily to alleviate urinary symptoms. - Order urinalysis. - Refer to urology for further evaluation.  Orders: -     Tamsulosin  HCl; Take 1 capsule (0.4 mg total) by mouth daily.  Dispense: 30 capsule; Refill: 3 -     Urinalysis, Routine w reflex microscopic -     Ambulatory referral to Urology  Cigarette nicotine  dependence without complication Assessment & Plan: Stopped smoking and vaping, continues nicotine  gum. Experienced anxiety and mood changes with Chantix . - Prescribe refills for nicotine  gum.  Orders: -     Nicotine  Polacrilex; Take 1 each (2 mg total) by mouth as needed for smoking cessation.  Dispense: 100 tablet; Refill: 1  Hypogonadism male Assessment & Plan: Reports low energy levels, similar to previous experiences. Previous blood donation may have affected blood count levels. - Order lab tests to reassess testosterone  levels. - Consider increasing testosterone  dosage based on lab results.   Orders: -     CBC with Differential/Platelet -     Testosterone ,Free and Total  Body mass index (BMI) 39.0-39.9, adult Assessment & Plan: Significant weight loss over 40 pounds since February due to Wegovy  and lifestyle changes. No nausea with Wegovy . - Continue Wegovy  at 2.4 mg with refills for a year. - Encourage continued exercise and dietary modifications.   Orders: -     Wegovy ; Inject 2.4 mg into the skin once a week.  Dispense: 9 mL; Refill: 3  Bilateral pulmonary embolism (HCC) Assessment & Plan: On xarelato 20mg . Continue current regimen.     Follow up plan: Return in about 4 months (around  05/24/2024) for Chronic illness f/u.  Hadassah SHAUNNA Nett, MD

## 2024-01-23 NOTE — Patient Instructions (Addendum)
 I sent flomax  for the urinary symptoms

## 2024-01-23 NOTE — Assessment & Plan Note (Signed)
 On xarelato 20mg . Continue current regimen.

## 2024-01-23 NOTE — Assessment & Plan Note (Signed)
 Urinary symptoms include dribbling and incomplete emptying. Discussed age-related prostate enlargement and BPH. Prostate cancer considered rare. - Prescribe Flomax  daily to alleviate urinary symptoms. - Order urinalysis. - Refer to urology for further evaluation.

## 2024-01-24 ENCOUNTER — Ambulatory Visit: Payer: Self-pay | Admitting: Pediatrics

## 2024-01-27 LAB — CBC WITH DIFFERENTIAL/PLATELET
Basophils Absolute: 0.1 x10E3/uL (ref 0.0–0.2)
Basos: 1 %
EOS (ABSOLUTE): 0.1 x10E3/uL (ref 0.0–0.4)
Eos: 1 %
Hematocrit: 54.9 % — ABNORMAL HIGH (ref 37.5–51.0)
Hemoglobin: 17.6 g/dL (ref 13.0–17.7)
Immature Grans (Abs): 0 x10E3/uL (ref 0.0–0.1)
Immature Granulocytes: 0 %
Lymphocytes Absolute: 1.4 x10E3/uL (ref 0.7–3.1)
Lymphs: 17 %
MCH: 32.5 pg (ref 26.6–33.0)
MCHC: 32.1 g/dL (ref 31.5–35.7)
MCV: 101 fL — ABNORMAL HIGH (ref 79–97)
Monocytes Absolute: 1 x10E3/uL — ABNORMAL HIGH (ref 0.1–0.9)
Monocytes: 13 %
Neutrophils Absolute: 5.6 x10E3/uL (ref 1.4–7.0)
Neutrophils: 67 %
Platelets: 189 x10E3/uL (ref 150–450)
RBC: 5.42 x10E6/uL (ref 4.14–5.80)
RDW: 12.3 % (ref 11.6–15.4)
WBC: 8.3 x10E3/uL (ref 3.4–10.8)

## 2024-01-27 LAB — TESTOSTERONE,FREE AND TOTAL
Testosterone, Free: >50 pg/mL — ABNORMAL HIGH (ref 6.8–21.5)
Testosterone: >1500 ng/dL — ABNORMAL HIGH (ref 264–916)

## 2024-01-31 ENCOUNTER — Other Ambulatory Visit: Payer: Self-pay | Admitting: Pediatrics

## 2024-01-31 ENCOUNTER — Other Ambulatory Visit: Payer: Self-pay

## 2024-01-31 DIAGNOSIS — E291 Testicular hypofunction: Secondary | ICD-10-CM

## 2024-01-31 NOTE — Progress Notes (Signed)
 Future orders placed for testosterone .  Richard SHAUNNA Nett, MD

## 2024-02-01 ENCOUNTER — Other Ambulatory Visit: Payer: Self-pay | Admitting: Pediatrics

## 2024-02-01 DIAGNOSIS — F17210 Nicotine dependence, cigarettes, uncomplicated: Secondary | ICD-10-CM

## 2024-02-01 NOTE — Telephone Encounter (Signed)
 Requested medications are due for refill today.  unsure  Requested medications are on the active medications list.  yes  Last refill. 01/23/2024 #100 1 rf  Future visit scheduled.   yes  Notes to clinic.  Please review for refill.    Requested Prescriptions  Pending Prescriptions Disp Refills   nicotine  polacrilex (NICORETTE ) 2 MG gum [Pharmacy Med Name: NICOTINE  2 MG CHEWING GUM] 100 each 1    Sig: Take 1 each (2 mg total) by mouth as needed for smoking cessation.     Psychiatry:  Drug Dependence Therapy Passed - 02/01/2024  4:54 PM      Passed - Valid encounter within last 12 months    Recent Outpatient Visits           1 week ago Lower urinary tract symptoms (LUTS)   [place] [facility] Herold Hadassah SQUIBB, MD   2 months ago Hypogonadism male   [place] [facility] Herold Hadassah SQUIBB, MD   4 months ago Hypogonadism male   [place] [facility] Herold Hadassah SQUIBB, MD   5 months ago Annual physical exam   [place] [facility] Herold Hadassah SQUIBB, MD   5 months ago OSA (obstructive sleep apnea)   [place] [facility] Herold Hadassah SQUIBB, MD       Future Appointments             In 1 week [place], Penne SAUNDERS, MD [facility]

## 2024-02-04 NOTE — Assessment & Plan Note (Deleted)
 On Flomax   Today we reviewed the physiology and common causes of male lower urinary tract symptoms (LUTS). Discussed potential etiologies including infectious, inflammatory, bladder-related, benign prostatic hyperplasia (BPH), and musculoskeletal/pelvic floor contributions. Reviewed the standard diagnostic workup (urinalysis, PVR, uroflow, prostate assessment, possible cystoscopy or imaging) and the spectrum of initial management strategies ranging from behavioral and lifestyle measures to pharmacologic therapy, with procedural options if indicated. All questions were addressed and the patient expressed understanding of the evaluation and treatment pathway.

## 2024-02-04 NOTE — Progress Notes (Deleted)
   02/04/24 6:19 AM   Richard Osborn Starring April 19, 1976 [id number]  CC: LUTS   HPI: 48 year old male here for initial evaluation of LUTS 2-yr hx of progressive LUTS, weak stream, post void dribbling Started Flomax  01/23/24  Hx of significant weight loss 40lb with exercise and Wegovy   Hx of OSA *** Hx of smoking on Chantix   Hx of low T on TRT - 200mg  IM cyp   - last total T >1500 (Aug 2025) Hx of ED - on 10mg  tadalafil  On Xarelto    PMH: Past Medical History:  Diagnosis Date   Chronic alcoholism (HCC)    Depressed    DVT (deep venous thrombosis) (HCC) 01/2022   right leg   History of pulmonary embolism 2020   bilateral   Sleep apnea    Substance abuse (HCC)    Alcohol    Surgical History: Past Surgical History:  Procedure Laterality Date   arm surgery      HEEL SPUR SURGERY     ~2008   NASAL SEPTUM SURGERY     ORIF ULNAR / RADIAL SHAFT FRACTURE      Family History: Family History  Problem Relation Age of Onset   Healthy Mother    Healthy Father    Diabetes Maternal Grandmother    Alzheimer's disease Maternal Grandmother    Other Maternal Grandfather        unknown medical history   Emphysema Paternal Grandmother    Other Paternal Grandfather        unknown medical history   Diabetes Other        grandmother    Social History:  reports that he quit smoking about 20 months ago. His smoking use included cigarettes. He started smoking about 21 years ago. He has a 20 pack-year smoking history. He has never used smokeless tobacco. He reports that he does not currently use alcohol. He reports that he does not currently use drugs after having used the following drugs: Cocaine and Marijuana.      Physical Exam: There were no vitals taken for this visit.   Constitutional:  Alert and oriented, No acute distress. Cardiovascular: No clubbing, cyanosis, or edema. Respiratory: Normal respiratory effort, no increased work of breathing. GI: Nondistended GU: *** Skin:  No rashes, bruises or suspicious lesions. Neurologic: Grossly intact, no focal deficits, moving all 4 extremities. Psychiatric: Normal mood and affect.  Laboratory Data:  Latest Reference Range & Units 01/23/24 08:33  Testosterone  264 - 916 ng/dL >8499 (H)  (H): Data is abnormally high  UA 01/23/24 - negative   Pertinent Imaging: I have personally viewed and interpreted the ***.    Assessment & Plan:    Lower urinary tract symptoms (LUTS) Assessment & Plan: On Flomax   Today we reviewed the physiology and common causes of male lower urinary tract symptoms (LUTS). Discussed potential etiologies including infectious, inflammatory, bladder-related, benign prostatic hyperplasia (BPH), and musculoskeletal/pelvic floor contributions. Reviewed the standard diagnostic workup (urinalysis, PVR, uroflow, prostate assessment, possible cystoscopy or imaging) and the spectrum of initial management strategies ranging from behavioral and lifestyle measures to pharmacologic therapy, with procedural options if indicated. All questions were addressed and the patient expressed understanding of the evaluation and treatment pathway.        Penne Skye, MD 02/04/2024  [facility] [address], [address] [place], [place] [address] [phone]

## 2024-02-09 ENCOUNTER — Other Ambulatory Visit: Payer: MEDICAID

## 2024-02-09 ENCOUNTER — Ambulatory Visit: Payer: MEDICAID | Admitting: Urology

## 2024-02-09 DIAGNOSIS — E291 Testicular hypofunction: Secondary | ICD-10-CM

## 2024-02-09 DIAGNOSIS — R399 Unspecified symptoms and signs involving the genitourinary system: Secondary | ICD-10-CM

## 2024-02-14 ENCOUNTER — Ambulatory Visit: Payer: Self-pay | Admitting: Pediatrics

## 2024-02-15 LAB — URINALYSIS, ROUTINE W REFLEX MICROSCOPIC
Bilirubin, UA: NEGATIVE
Glucose, UA: NEGATIVE
Ketones, UA: NEGATIVE
Nitrite, UA: POSITIVE — AB

## 2024-02-15 LAB — TESTOSTERONE,FREE AND TOTAL
Testosterone, Free: 18.8 pg/mL (ref 6.8–21.5)
Testosterone: 869 ng/dL (ref 264–916)

## 2024-02-15 NOTE — Addendum Note (Signed)
 Addended by: Noralyn Karim P on: 02/15/2024 01:48 PM   Modules accepted: Orders

## 2024-04-07 ENCOUNTER — Other Ambulatory Visit: Payer: Self-pay | Admitting: Pediatrics

## 2024-04-07 DIAGNOSIS — F17210 Nicotine dependence, cigarettes, uncomplicated: Secondary | ICD-10-CM

## 2024-04-09 NOTE — Telephone Encounter (Signed)
 Requested medications are due for refill today.  unsure  Requested medications are on the active medications list.  yes  Last refill. 01/23/2024 #100 1 rf  Future visit scheduled.   yes  Notes to clinic.  Rx written to expire 04/16/2024. Please review for refill.    Requested Prescriptions  Pending Prescriptions Disp Refills   nicotine  polacrilex (NICORETTE ) 2 MG gum [Pharmacy Med Name: NICOTINE  2 MG CHEWING GUM] 100 each 1    Sig: Take 1 each (2 mg total) by mouth as needed for smoking cessation.     Psychiatry:  Drug Dependence Therapy Passed - 04/09/2024 11:35 AM      Passed - Valid encounter within last 12 months    Recent Outpatient Visits           2 months ago Lower urinary tract symptoms (LUTS)   [place] [facility] Herold Hadassah SQUIBB, MD   4 months ago Hypogonadism male   [place] [facility] Herold Hadassah SQUIBB, MD   6 months ago Hypogonadism male   [place] [facility] Herold Hadassah SQUIBB, MD   7 months ago Annual physical exam   [place] [facility] Herold Hadassah SQUIBB, MD   7 months ago OSA (obstructive sleep apnea)   [place] [facility] Herold Hadassah SQUIBB, MD

## 2024-04-14 ENCOUNTER — Other Ambulatory Visit: Payer: Self-pay | Admitting: Pediatrics

## 2024-04-14 DIAGNOSIS — R600 Localized edema: Secondary | ICD-10-CM

## 2024-04-16 NOTE — Telephone Encounter (Signed)
 Requested Prescriptions  Refused Prescriptions Disp Refills   furosemide  (LASIX ) 20 MG tablet [Pharmacy Med Name: FUROSEMIDE  20 MG TABLET] 90 tablet 0    Sig: TAKE 1 TABLET (20 MG TOTAL) BY MOUTH DAILY AS NEEDED FOR EDEMA.     Cardiovascular:  Diuretics - Loop Failed - 04/16/2024  2:09 PM      Failed - Mg Level in normal range and within 180 days    No results found for: MG       Passed - K in normal range and within 180 days    Potassium  Date Value Ref Range Status  11/22/2023 4.7 3.5 - 5.2 mmol/L Final         Passed - Ca in normal range and within 180 days    Calcium  Date Value Ref Range Status  11/22/2023 8.9 8.7 - 10.2 mg/dL Final         Passed - Na in normal range and within 180 days    Sodium  Date Value Ref Range Status  11/22/2023 139 134 - 144 mmol/L Final         Passed - Cr in normal range and within 180 days    Creatinine, Ser  Date Value Ref Range Status  11/22/2023 1.20 0.76 - 1.27 mg/dL Final         Passed - Cl in normal range and within 180 days    Chloride  Date Value Ref Range Status  11/22/2023 103 96 - 106 mmol/L Final         Passed - Last BP in normal range    BP Readings from Last 1 Encounters:  01/23/24 126/77         Passed - Valid encounter within last 6 months    Recent Outpatient Visits           2 months ago Lower urinary tract symptoms (LUTS)   [place] [facility] Herold Hadassah SQUIBB, MD   4 months ago Hypogonadism male   [place] [facility] Herold Hadassah SQUIBB, MD   6 months ago Hypogonadism male   [place] [facility] Herold Hadassah SQUIBB, MD   7 months ago Annual physical exam   [place] [facility] Herold Hadassah SQUIBB, MD   7 months ago OSA (obstructive sleep apnea)   [place] [facility] Herold Hadassah SQUIBB, MD

## 2024-05-02 ENCOUNTER — Telehealth: Payer: Self-pay

## 2024-05-02 ENCOUNTER — Other Ambulatory Visit (HOSPITAL_COMMUNITY): Payer: Self-pay

## 2024-05-02 NOTE — Telephone Encounter (Signed)
 Pharmacy Patient Advocate Encounter   Received notification from Onbase that prior authorization for Wegovy  2.4MG /0.75ML auto-injectors is required/requested.   Insurance verification completed.   The patient is insured through [employer].   Per test claim: PA required; PA submitted to above mentioned insurance via Latent Key/confirmation #/EOC [id number] Status is pending

## 2024-05-03 ENCOUNTER — Other Ambulatory Visit: Payer: Self-pay | Admitting: Pediatrics

## 2024-05-03 DIAGNOSIS — Z6839 Body mass index (BMI) 39.0-39.9, adult: Secondary | ICD-10-CM

## 2024-05-08 NOTE — Telephone Encounter (Signed)
 Pharmacy Patient Advocate Encounter  Received notification from [place] [employer]ICA[employer] that Prior Authorization for Wegovy  2.4MG /0.75ML auto-injectors has been DENIED.  Full denial letter will be uploaded to the media tab. See denial reason below.   Medicaid has stopped covering glp1s for weightloss since 10.1.25

## 2024-05-28 ENCOUNTER — Ambulatory Visit: Payer: MEDICAID | Admitting: Pediatrics

## 2024-05-29 ENCOUNTER — Ambulatory Visit (INDEPENDENT_AMBULATORY_CARE_PROVIDER_SITE_OTHER): Payer: MEDICAID | Admitting: Podiatry

## 2024-05-29 ENCOUNTER — Encounter: Payer: Self-pay | Admitting: Podiatry

## 2024-05-29 DIAGNOSIS — L600 Ingrowing nail: Secondary | ICD-10-CM | POA: Diagnosis not present

## 2024-05-29 NOTE — Progress Notes (Signed)
"  °  Subjective:  Patient ID: Richard Osborn, male    DOB: 1975-04-02,  MRN: [id number]  Chief Complaint  Patient presents with   Ingrown Toenail    Rm 2 Patient is here for ingrown toenail of the right and left hallux ( lateral border).    48 y.o. male presents with the above complaint. History confirmed with patient.   Objective:  Physical Exam: warm, good capillary refill, no trophic changes or ulcerative lesions, normal DP and PT pulses, normal sensory exam, and ingrown hallux lateral border bilateral no paronychia  Assessment:   1. Ingrowing left great toenail   2. Ingrowing right great toenail      Plan:  Patient was evaluated and treated and all questions answered.     Ingrown Nail, bilaterally -Patient elects to proceed with minor surgery to remove ingrown toenail today. Consent reviewed and signed by patient. -Ingrown nail excised. See procedure note. -Educated on post-procedure care including soaking. Written instructions provided and reviewed. -Rx for Cortisporin sent to pharmacy. -Advised on signs and symptoms of infection developing.  We discussed that the phenol likely will create some redness and edema and tenderness around the nailbed as long as it is localized this is to be expected.  Will return as needed if any infection signs develop  Procedure: Excision of Ingrown Toenail Location: Bilateral 1st toe lateral nail borders. Anesthesia: Lidocaine 1% plain; 1.5 mL and Marcaine 0.5% plain; 1.5 mL, digital block. Skin Prep: Betadine. Dressing: Silvadene; telfa; dry, sterile, compression dressing. Technique: Following skin prep, the toe was exsanguinated and a tourniquet was secured at the base of the toe. The affected nail border was freed, split with a nail splitter, and excised. Chemical matrixectomy was then performed with phenol and irrigated out with alcohol. The tourniquet was then removed and sterile dressing applied. Disposition: Patient tolerated  procedure well.    Return if symptoms worsen or fail to improve.   "

## 2024-05-29 NOTE — Patient Instructions (Signed)

## 2024-06-12 ENCOUNTER — Encounter: Payer: Self-pay | Admitting: Pediatrics

## 2024-06-24 ENCOUNTER — Other Ambulatory Visit: Payer: Self-pay

## 2024-06-24 DIAGNOSIS — E291 Testicular hypofunction: Secondary | ICD-10-CM

## 2024-06-24 DIAGNOSIS — T7840XA Allergy, unspecified, initial encounter: Secondary | ICD-10-CM

## 2024-06-24 NOTE — Telephone Encounter (Signed)
 Copied from CRM #[id number]. Topic: Clinical - Medication Refill >> Jun 24, 2024  1:07 PM Edsel HERO wrote: Medication: fluticasone  ( FLONASE ) 50 MCG/ACT nasal spray testosterone  cypionate (DEPOTESTOSTERONE CYPIONATE) 200 MG/ML injection  Has the patient contacted their pharmacy? No (Agent: If no, request that the patient contact the pharmacy for the refill. If patient does not wish to contact the pharmacy document the reason why and proceed with request.) (Agent: If yes, when and what did the pharmacy advise?)  This is the patient's preferred pharmacy:  [id number] [place], [place] - [address] [place] [address] Phone: [phone] Fax: [phone]  Is this the correct pharmacy for this prescription? Yes If no, delete pharmacy and type the correct one.   Has the prescription been filled recently? Yes  Is the patient out of the medication? Yes  Has the patient been seen for an appointment in the last year OR does the patient have an upcoming appointment? Yes  Can we respond through MyChart? Yes  Agent: Please be advised that Rx refills may take up to 3 business days. We ask that you follow-up with your pharmacy.

## 2024-06-25 ENCOUNTER — Telehealth: Payer: MEDICAID

## 2024-06-25 MED ORDER — FLUTICASONE PROPIONATE 50 MCG/ACT NA SUSP: 2.0000 | Freq: Every day | NASAL | 6 refills | Status: AC

## 2024-06-25 MED ORDER — TESTOSTERONE CYPIONATE 200 MG/ML IM SOLN: 150.0000 mg | INTRAMUSCULAR | 3 refills | Status: AC

## 2024-06-25 NOTE — Telephone Encounter (Signed)
 Patient has TOC appointment in June

## 2024-06-25 NOTE — Telephone Encounter (Signed)
 Requested medication (s) are due for refill today: yes  Requested medication (s) are on the active medication list: yes  Last refill:  flonase  08/22/23, testosterone  08/29/23  Future visit scheduled: yes, Appt for [facility] 11/01/24   Notes to clinic:  Dr. Herold pt      Requested Prescriptions  Pending Prescriptions Disp Refills   fluticasone  (FLONASE ) 50 MCG/ACT nasal spray 16 g 6    Sig: Place 2 sprays into both nostrils daily.     Ear, Nose, and Throat: Nasal Preparations - Corticosteroids Passed - 06/25/2024  2:52 PM      Passed - Valid encounter within last 12 months    Recent Outpatient Visits           5 months ago Lower urinary tract symptoms (LUTS)   [place] [facility] Herold Hadassah SQUIBB, MD   7 months ago Hypogonadism male   [place] [facility] Herold Hadassah SQUIBB, MD   9 months ago Hypogonadism male   [place] [facility] Herold Hadassah SQUIBB, MD   10 months ago Annual physical exam   [place] [facility] Herold Hadassah SQUIBB, MD   10 months ago OSA (obstructive sleep apnea)   [place] [facility] Herold Hadassah SQUIBB, MD               testosterone  cypionate (DEPOTESTOSTERONE CYPIONATE) 200 MG/ML injection 10 mL 3    Sig: Inject 0.75 mLs (150 mg total) into the muscle every 14 (fourteen) days.     Off-Protocol Failed - 06/25/2024  2:52 PM      Failed - Medication not assigned to a protocol, review manually.      Passed - Valid encounter within last 12 months    Recent Outpatient Visits           5 months ago Lower urinary tract symptoms (LUTS)   [place] [facility] Herold Hadassah SQUIBB, MD   7 months ago Hypogonadism male   [place] [facility] Herold Hadassah SQUIBB, MD   9 months ago Hypogonadism male   [place] [facility] Herold Hadassah SQUIBB, MD   10 months ago Annual physical exam   [place] [facility] Herold Hadassah SQUIBB, MD   10 months ago OSA  (obstructive sleep apnea)   [place] [facility] Herold Hadassah SQUIBB, MD

## 2024-11-01 ENCOUNTER — Encounter: Payer: MEDICAID | Admitting: Nurse Practitioner
